# Patient Record
Sex: Female | Born: 1950 | Race: Black or African American | Hispanic: No | Marital: Single | Smoking: Never smoker
Health system: Southern US, Community
[De-identification: ages and names within clinical notes are randomized; demographics above are authoritative.]

## PROBLEM LIST (undated history)

## (undated) DIAGNOSIS — E119 Type 2 diabetes mellitus without complications: Secondary | ICD-10-CM

## (undated) DIAGNOSIS — R51 Headache: Secondary | ICD-10-CM

## (undated) DIAGNOSIS — R519 Headache, unspecified: Secondary | ICD-10-CM

## (undated) DIAGNOSIS — H409 Unspecified glaucoma: Secondary | ICD-10-CM

## (undated) DIAGNOSIS — M1990 Unspecified osteoarthritis, unspecified site: Secondary | ICD-10-CM

## (undated) DIAGNOSIS — T4145XA Adverse effect of unspecified anesthetic, initial encounter: Secondary | ICD-10-CM

## (undated) DIAGNOSIS — H269 Unspecified cataract: Secondary | ICD-10-CM

## (undated) DIAGNOSIS — E079 Disorder of thyroid, unspecified: Secondary | ICD-10-CM

## (undated) DIAGNOSIS — H209 Unspecified iridocyclitis: Secondary | ICD-10-CM

## (undated) DIAGNOSIS — T8859XA Other complications of anesthesia, initial encounter: Secondary | ICD-10-CM

## (undated) DIAGNOSIS — E039 Hypothyroidism, unspecified: Secondary | ICD-10-CM

## (undated) DIAGNOSIS — I10 Essential (primary) hypertension: Secondary | ICD-10-CM

## (undated) HISTORY — PX: CHOLECYSTECTOMY: SHX55

## (undated) HISTORY — PX: ABDOMINAL HYSTERECTOMY: SHX81

## (undated) HISTORY — PX: THYROID SURGERY: SHX805

## (undated) HISTORY — DX: Unspecified osteoarthritis, unspecified site: M19.90

## (undated) HISTORY — DX: Unspecified glaucoma: H40.9

## (undated) HISTORY — DX: Essential (primary) hypertension: I10

## (undated) HISTORY — PX: EYE SURGERY: SHX253

## (undated) HISTORY — DX: Unspecified cataract: H26.9

## (undated) HISTORY — DX: Unspecified iridocyclitis: H20.9

## (undated) HISTORY — PX: JOINT REPLACEMENT: SHX530

## (undated) HISTORY — DX: Disorder of thyroid, unspecified: E07.9

---

## 2011-08-11 DIAGNOSIS — M1711 Unilateral primary osteoarthritis, right knee: Secondary | ICD-10-CM | POA: Insufficient documentation

## 2012-12-31 ENCOUNTER — Encounter: Payer: Self-pay | Admitting: Family Medicine

## 2012-12-31 ENCOUNTER — Ambulatory Visit (INDEPENDENT_AMBULATORY_CARE_PROVIDER_SITE_OTHER): Payer: BC Managed Care – PPO | Admitting: Family Medicine

## 2012-12-31 VITALS — BP 130/70 | HR 78 | Temp 97.6°F | Resp 20 | Ht 67.0 in | Wt 250.0 lb

## 2012-12-31 DIAGNOSIS — E669 Obesity, unspecified: Secondary | ICD-10-CM

## 2012-12-31 DIAGNOSIS — I10 Essential (primary) hypertension: Secondary | ICD-10-CM

## 2012-12-31 DIAGNOSIS — E063 Autoimmune thyroiditis: Secondary | ICD-10-CM

## 2012-12-31 DIAGNOSIS — R609 Edema, unspecified: Secondary | ICD-10-CM

## 2012-12-31 MED ORDER — FUROSEMIDE 80 MG PO TABS: 80.0000 mg | ORAL_TABLET | Freq: Every day | ORAL | Status: DC

## 2012-12-31 MED ORDER — VITAMIN D3 50 MCG (2000 UT) PO TABS: 1.0000 | ORAL_TABLET | Freq: Every day | ORAL | Status: DC

## 2012-12-31 NOTE — Patient Instructions (Addendum)
Release of records- Dr.Micheal Willette Pa  Release of records- Dr.Robert Castellucci- [phone]  Release of records- Dr. Deneise Lever- GYN Take the lasix once a day Stop the HCTZ  Continue all other medications Referral to endocrinology F/U 1st week of November

## 2013-01-03 ENCOUNTER — Encounter: Payer: Self-pay | Admitting: Family Medicine

## 2013-01-03 DIAGNOSIS — E669 Obesity, unspecified: Secondary | ICD-10-CM | POA: Insufficient documentation

## 2013-01-03 DIAGNOSIS — R609 Edema, unspecified: Secondary | ICD-10-CM | POA: Insufficient documentation

## 2013-01-03 DIAGNOSIS — I10 Essential (primary) hypertension: Secondary | ICD-10-CM | POA: Insufficient documentation

## 2013-01-03 DIAGNOSIS — E063 Autoimmune thyroiditis: Secondary | ICD-10-CM | POA: Insufficient documentation

## 2013-01-03 NOTE — Assessment & Plan Note (Signed)
Discussed need for routine exercise

## 2013-01-03 NOTE — Assessment & Plan Note (Signed)
On replacement obtain records, referral to new endocrinologist
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
## 2013-01-03 NOTE — Assessment & Plan Note (Signed)
Continue current meds Obtain records and recent labs

## 2013-01-03 NOTE — Assessment & Plan Note (Signed)
D/c HCTZ, advised to take Lasix daily

## 2013-01-13 ENCOUNTER — Other Ambulatory Visit (HOSPITAL_COMMUNITY): Payer: Self-pay | Admitting: "Endocrinology

## 2013-01-13 DIAGNOSIS — Z9009 Acquired absence of other part of head and neck: Secondary | ICD-10-CM

## 2013-01-13 DIAGNOSIS — E890 Postprocedural hypothyroidism: Secondary | ICD-10-CM

## 2013-01-18 ENCOUNTER — Ambulatory Visit (HOSPITAL_COMMUNITY)
Admission: RE | Admit: 2013-01-18 | Discharge: 2013-01-18 | Disposition: A | Discharge status: Home / Self Care | Payer: BC Managed Care – PPO | Source: Ambulatory Visit | Attending: "Endocrinology | Admitting: "Endocrinology

## 2013-01-18 DIAGNOSIS — Z9009 Acquired absence of other part of head and neck: Secondary | ICD-10-CM

## 2013-01-18 DIAGNOSIS — Z09 Encounter for follow-up examination after completed treatment for conditions other than malignant neoplasm: Secondary | ICD-10-CM | POA: Insufficient documentation

## 2013-01-18 DIAGNOSIS — E890 Postprocedural hypothyroidism: Secondary | ICD-10-CM

## 2013-01-18 DIAGNOSIS — E0789 Other specified disorders of thyroid: Secondary | ICD-10-CM | POA: Insufficient documentation

## 2013-03-07 ENCOUNTER — Encounter: Payer: Self-pay | Admitting: Family Medicine

## 2013-03-07 ENCOUNTER — Ambulatory Visit (INDEPENDENT_AMBULATORY_CARE_PROVIDER_SITE_OTHER): Payer: BC Managed Care – PPO | Admitting: Family Medicine

## 2013-03-07 VITALS — BP 130/70 | HR 82 | Temp 98.3°F | Resp 18 | Ht 67.0 in | Wt 240.0 lb

## 2013-03-07 DIAGNOSIS — Z1211 Encounter for screening for malignant neoplasm of colon: Secondary | ICD-10-CM

## 2013-03-07 DIAGNOSIS — R7309 Other abnormal glucose: Secondary | ICD-10-CM

## 2013-03-07 DIAGNOSIS — R609 Edema, unspecified: Secondary | ICD-10-CM

## 2013-03-07 DIAGNOSIS — R7303 Prediabetes: Secondary | ICD-10-CM | POA: Insufficient documentation

## 2013-03-07 DIAGNOSIS — R7302 Impaired glucose tolerance (oral): Secondary | ICD-10-CM

## 2013-03-07 DIAGNOSIS — E669 Obesity, unspecified: Secondary | ICD-10-CM

## 2013-03-07 DIAGNOSIS — I10 Essential (primary) hypertension: Secondary | ICD-10-CM

## 2013-03-07 NOTE — Patient Instructions (Signed)
Release of records [facility], [place] - need stress test results/ Knee surgery report COntinue current medications Referral for colonoscopy F/U 6 Months

## 2013-03-07 NOTE — Assessment & Plan Note (Signed)
10 lb weight loss noted.  

## 2013-03-07 NOTE — Assessment & Plan Note (Signed)
Well controlled 

## 2013-03-07 NOTE — Progress Notes (Signed)
  Subjective:    Patient ID: Danielle Howard, female    DOB: July 18, 1950, 62 y.o.   MRN: [id number]  HPI  Patient here to follow chronic medical problems. She has no specific concerns today. She is due to have her colonoscopy scheduled. She did establish with endocrinology for her Hashimoto's thyroiditis labs and records were reviewed. She is taking the Lasix every day and notices a difference when she does take a daily. She is are he had her flu shot  Review of Systems  GEN- denies fatigue, fever, weight loss,weakness, recent illness HEENT- denies eye drainage, change in vision, nasal discharge, CVS- denies chest pain, palpitations RESP- denies SOB, cough, wheeze ABD- denies N/V, change in stools, abd pain GU- denies dysuria, hematuria, dribbling, incontinence MSK- denies joint pain, muscle aches, injury Neuro- denies headache, dizziness, syncope, seizure activity      Objective:   Physical Exam GEN- NAD, alert and oriented x3 HEENT- PERRL, EOMI, non injected sclera, pink conjunctiva, MMM, oropharynx clear Neck- Supple, no thryomegaly CVS- RRR, no murmur RESP-CTAB EXT- + pedal edema Pulses- Radial, DP- 2+        Assessment & Plan:

## 2013-03-07 NOTE — Assessment & Plan Note (Signed)
Improved She declines additional K with lasix , she does get cramping

## 2013-03-07 NOTE — Assessment & Plan Note (Addendum)
For now work on diet and exercise A1C  5.8% FLP normal

## 2013-03-09 ENCOUNTER — Encounter (INDEPENDENT_AMBULATORY_CARE_PROVIDER_SITE_OTHER): Payer: Self-pay | Admitting: *Deleted

## 2013-03-17 ENCOUNTER — Other Ambulatory Visit (INDEPENDENT_AMBULATORY_CARE_PROVIDER_SITE_OTHER): Payer: Self-pay | Admitting: *Deleted

## 2013-03-17 ENCOUNTER — Encounter (INDEPENDENT_AMBULATORY_CARE_PROVIDER_SITE_OTHER): Payer: Self-pay | Admitting: *Deleted

## 2013-03-17 ENCOUNTER — Telehealth (INDEPENDENT_AMBULATORY_CARE_PROVIDER_SITE_OTHER): Payer: Self-pay | Admitting: *Deleted

## 2013-03-17 DIAGNOSIS — Z1211 Encounter for screening for malignant neoplasm of colon: Secondary | ICD-10-CM

## 2013-03-17 MED ORDER — PEG-KCL-NACL-NASULF-NA ASC-C 100 G PO SOLR: 1.0000 | Freq: Once | ORAL | Status: DC

## 2013-03-17 NOTE — Telephone Encounter (Signed)
Patient needs movi prep 

## 2013-03-22 ENCOUNTER — Ambulatory Visit: Payer: Self-pay | Admitting: Family Medicine

## 2013-04-07 ENCOUNTER — Encounter (INDEPENDENT_AMBULATORY_CARE_PROVIDER_SITE_OTHER): Payer: Self-pay | Admitting: *Deleted

## 2013-04-25 ENCOUNTER — Telehealth (INDEPENDENT_AMBULATORY_CARE_PROVIDER_SITE_OTHER): Payer: Self-pay | Admitting: *Deleted

## 2013-04-25 NOTE — Telephone Encounter (Signed)
  Procedure: tcs  Reason/Indication:  screening  Has patient had this procedure before?  Yes, more than 10 yrs ago  If so, when, by whom and where?    Is there a family history of colon cancer?  no  Who?  What age when diagnosed?    Is patient diabetic?   no      Does patient have prosthetic heart valve?  no  Do you have a pacemaker?  no  Has patient ever had endocarditis? no  Has patient had joint replacement within last 12 months?  no  Does patient tend to be constipated or take laxatives? no  Is patient on Coumadin, Plavix and/or Aspirin? no  Medications: verapamil 240 mg bid, levothyroxine 150 mcg daily, astradiol 1 mg daily, furosemide 80 mg daily, vit d 1000 mg daily, calcium 600 mg daily, vita fusion multi vit bid  Allergies:   Medication Adjustment:   Procedure date & time: 05/25/13 at 950

## 2013-04-27 NOTE — Telephone Encounter (Signed)
agree

## 2013-05-10 ENCOUNTER — Encounter (HOSPITAL_COMMUNITY): Payer: Self-pay | Admitting: Pharmacy Technician

## 2013-05-20 ENCOUNTER — Telehealth: Payer: Self-pay | Admitting: Family Medicine

## 2013-05-20 NOTE — Telephone Encounter (Signed)
Pt went to pharmacy and verapamil is on back order, pharmacy says it could be Monday before they can get some in and the pt only has 10 pills  [facility]  Call back number is [phone]

## 2013-05-25 ENCOUNTER — Encounter (HOSPITAL_COMMUNITY): Payer: Self-pay | Admitting: *Deleted

## 2013-05-25 ENCOUNTER — Encounter (HOSPITAL_COMMUNITY)
Admission: RE | Disposition: A | Discharge status: Home / Self Care | Payer: Self-pay | Source: Ambulatory Visit | Attending: Internal Medicine

## 2013-05-25 ENCOUNTER — Ambulatory Visit (HOSPITAL_COMMUNITY)
Admission: RE | Admit: 2013-05-25 | Discharge: 2013-05-25 | Disposition: A | Discharge status: Home / Self Care | Payer: BC Managed Care – PPO | Source: Ambulatory Visit | Attending: Internal Medicine | Admitting: Internal Medicine

## 2013-05-25 DIAGNOSIS — K644 Residual hemorrhoidal skin tags: Secondary | ICD-10-CM | POA: Insufficient documentation

## 2013-05-25 DIAGNOSIS — I10 Essential (primary) hypertension: Secondary | ICD-10-CM | POA: Insufficient documentation

## 2013-05-25 DIAGNOSIS — Z79890 Hormone replacement therapy (postmenopausal): Secondary | ICD-10-CM | POA: Insufficient documentation

## 2013-05-25 DIAGNOSIS — D126 Benign neoplasm of colon, unspecified: Secondary | ICD-10-CM

## 2013-05-25 DIAGNOSIS — Z1211 Encounter for screening for malignant neoplasm of colon: Secondary | ICD-10-CM

## 2013-05-25 DIAGNOSIS — Z79899 Other long term (current) drug therapy: Secondary | ICD-10-CM | POA: Insufficient documentation

## 2013-05-25 HISTORY — PX: COLONOSCOPY: SHX5424

## 2013-05-25 HISTORY — DX: Hypothyroidism, unspecified: E03.9

## 2013-05-25 SURGERY — COLONOSCOPY
Anesthesia: Moderate Sedation

## 2013-05-25 MED ORDER — SIMETHICONE 40 MG/0.6ML PO SUSP
ORAL | Status: DC | PRN
  Administered 2013-05-25: 11:00:00

## 2013-05-25 MED ORDER — SODIUM CHLORIDE 0.9 % IV SOLN
INTRAVENOUS | Status: DC
  Administered 2013-05-25: 10:00:00 via INTRAVENOUS

## 2013-05-25 MED ORDER — MIDAZOLAM HCL 5 MG/5ML IJ SOLN
INTRAMUSCULAR | Status: DC | PRN
  Administered 2013-05-25: 2 mg via INTRAVENOUS
  Administered 2013-05-25: 3 mg via INTRAVENOUS
  Administered 2013-05-25 (×2): 2 mg via INTRAVENOUS

## 2013-05-25 MED ORDER — MEPERIDINE HCL 50 MG/ML IJ SOLN
INTRAMUSCULAR | Status: AC
  Filled 2013-05-25: qty 1

## 2013-05-25 MED ORDER — MEPERIDINE HCL 50 MG/ML IJ SOLN
INTRAMUSCULAR | Status: DC | PRN
  Administered 2013-05-25 (×2): 25 mg via INTRAVENOUS

## 2013-05-25 MED ORDER — MIDAZOLAM HCL 5 MG/5ML IJ SOLN
INTRAMUSCULAR | Status: AC
  Filled 2013-05-25: qty 10

## 2013-05-25 NOTE — H&P (Signed)
Danielle Howard is an 63 y.o. female.   Chief Complaint: Patient's here for colonoscopy. HPI: Patient is 63 year old African female who is screening colonoscopy. Her last exam was over 10 years ago when she was living in [place] which. She denies abdominal pain change in her bowel habits or rectal bleeding. Family history is negative for CRC.  Past Medical History  Diagnosis Date  . Hypertension   . Thyroid disease   . Hypothyroidism     Past Surgical History  Procedure Laterality Date  . Cholecystectomy      02/2012  . Abdominal hysterectomy    . Thyroid surgery    . Joint replacement Bilateral     2012,2013-bilateral knees    Family History  Problem Relation Age of Onset  . Arthritis Mother   . Heart disease Mother   . Hypertension Mother   . Arthritis Father   . Hyperlipidemia Father   . HIV/AIDS Brother    Social History:  reports that she has never smoked. She does not have any smokeless tobacco history on file. She reports that she drinks alcohol. She reports that she does not use illicit drugs.  Allergies:  Allergies  Allergen Reactions  . Dicloxacillin     GI upset    Medications Prior to Admission  Medication Sig Dispense Refill  . acetaminophen (TYLENOL) 500 MG tablet Take 500 mg by mouth 2 (two) times daily as needed for moderate pain or headache.      . calcium carbonate (OS-CAL) 600 MG TABS tablet Take 600 mg by mouth 2 (two) times daily with a meal.      . cholecalciferol (VITAMIN D) 1000 UNITS tablet Take 1,000 Units by mouth daily.      Marland Kitchen estradiol (ESTRACE) 1 MG tablet Take 1 mg by mouth daily.      . furosemide (LASIX) 80 MG tablet Take 1 tablet (80 mg total) by mouth daily.  90 tablet  1  . levothyroxine (SYNTHROID, LEVOTHROID) 150 MCG tablet Take 150 mcg by mouth daily before breakfast.      . Multiple Vitamins-Minerals (MULTIVITAMIN WITH MINERALS) tablet Take 1 tablet by mouth daily.      . peg 3350 powder (MOVIPREP) 100 G SOLR Take 1 kit (200 g  total) by mouth once.  1 kit  0  . verapamil (CALAN-SR) 240 MG CR tablet Take 240 mg by mouth 2 (two) times daily.         No results found for this or any previous visit (from the past 48 hour(s)). No results found.  ROS  Blood pressure 121/61, pulse 82, temperature 97.7 F (36.5 C), temperature source Oral, resp. rate 20, height _0  (1.702 m), weight 240 lb (108.863 kg), SpO2 97.00%. Physical Exam  Constitutional: She appears well-developed and well-nourished.  HENT:  Mouth/Throat: Oropharynx is clear and moist.  Eyes: Conjunctivae are normal. No scleral icterus.  Neck: No thyromegaly present.  Cardiovascular: Normal rate, regular rhythm and normal heart sounds.   No murmur heard. Respiratory: Effort normal and breath sounds normal.  GI: Soft. She exhibits no distension. There is no tenderness.  Musculoskeletal: She exhibits no edema.  Lymphadenopathy:    She has no cervical adenopathy.  Neurological: She is alert.  Skin: Skin is warm and dry.     Assessment/Plan Average risk screening colonoscopy.  Gwyndolyn Guilford U 05/25/2013, 10:40 AM

## 2013-05-25 NOTE — Discharge Instructions (Signed)
Resume usual diet and medications. No driving for 24 hours. Physician will contact you with biopsy results   Colonoscopy Care After These instructions give you information on caring for yourself after your procedure. Your doctor may also give you more specific instructions. Call your doctor if you have any problems or questions after your procedure. HOME CARE  Take it easy for the next 24 hours.  Rest.  Walk or use warm packs on your belly (abdomen) if you have belly cramping or gas.  Do not drive for 24 hours.  You may shower.  Do not sign important papers or use machinery for 24 hours.  Drink enough fluids to keep your pee (urine) clear or pale yellow.  Resume your normal diet. Avoid heavy or fried foods.  Avoid alcohol.  Continue taking your normal medicines.  Only take medicine as told by your doctor. Do not take aspirin. If you had growths (polyps) removed:  Do not take aspirin.  Do not drink alcohol for 7 days or as told by your doctor.  Eat a soft diet for 24 hours. GET HELP RIGHT AWAY IF:  You have a fever.  You pass clumps of tissue (blood clots) or fill the toilet with blood.  You have belly pain that gets worse and medicine does not help.  Your belly is puffy (swollen).  You feel sick to your stomach (nauseous) or throw up (vomit). MAKE SURE YOU:  Understand these instructions.  Will watch your condition.  Will get help right away if you are not doing well or get worse. Document Released: 05/24/2010 Document Revised: 07/14/2011 Document Reviewed: 12/27/2012 [facility] Patient Information 2014 [employer].

## 2013-05-25 NOTE — Op Note (Signed)
COLONOSCOPY PROCEDURE REPORT  PATIENT:  Danielle Howard  MR#:  [id number] Birthdate:  30-Jul-1950, 63 y.o., female Endoscopist:  Dr. Rogene Houston, MD Referred By:  Dr. Vic Blackbird, MD Procedure Date: 05/25/2013  Procedure:   Colonoscopy  Indications:  Patient is a 63 year old African female was undergoing average risk screening colonoscopy. Patient's last colonoscopy was over 10 years ago in [place].  Informed Consent:  The procedure and risks were reviewed with the patient and informed consent was obtained.  Medications:  Demerol 50 mg IV Versed 9 mg IV  Description of procedure:  After a digital rectal exam was performed, that colonoscope was advanced from the anus through the rectum and colon to the area of the cecum, ileocecal valve and appendiceal orifice. The cecum was deeply intubated. These structures were well-seen and photographed for the record. From the level of the cecum and ileocecal valve, the scope was slowly and cautiously withdrawn. The mucosal surfaces were carefully surveyed utilizing scope tip to flexion to facilitate fold flattening as needed. The scope was pulled down into the rectum where a thorough exam including retroflexion was performed.  Findings:   Prep satisfactory. Four small polyps ablated via cold biopsy and submitted together. These are located at cecum, ascending colon, splenic flexure and sigmoid colon. Normal rectal mucosa. Small hemorrhoids below the dentate line.   Therapeutic/Diagnostic Maneuvers Performed:  See above  Complications:  None  Cecal Withdrawal Time:  19 minutes  Impression:  Examination performed to cecum. Four small polyps ablated via cold biopsy and submitted together(cecum, ascending colon, splenic flexure and sigmoid colon). Small external hemorrhoids.  Recommendations:  Standard instructions given. I will contact patient with biopsy results and further recommendations.  REHMAN,NAJEEB U  05/25/2013 11:20  AM  CC: Dr. Vic Blackbird, MD & Dr. Rayne Du ref. provider found

## 2013-05-30 ENCOUNTER — Encounter (HOSPITAL_COMMUNITY): Payer: Self-pay | Admitting: Internal Medicine

## 2013-06-15 ENCOUNTER — Other Ambulatory Visit: Payer: Self-pay | Admitting: Family Medicine

## 2013-06-15 DIAGNOSIS — Z139 Encounter for screening, unspecified: Secondary | ICD-10-CM

## 2013-06-30 ENCOUNTER — Ambulatory Visit (HOSPITAL_COMMUNITY): Payer: BC Managed Care – PPO

## 2013-07-07 ENCOUNTER — Ambulatory Visit (HOSPITAL_COMMUNITY)
Admission: RE | Admit: 2013-07-07 | Discharge: 2013-07-07 | Disposition: A | Discharge status: Home / Self Care | Payer: BC Managed Care – PPO | Source: Ambulatory Visit | Attending: Family Medicine | Admitting: Family Medicine

## 2013-07-07 DIAGNOSIS — Z139 Encounter for screening, unspecified: Secondary | ICD-10-CM

## 2013-07-07 DIAGNOSIS — Z1231 Encounter for screening mammogram for malignant neoplasm of breast: Secondary | ICD-10-CM | POA: Insufficient documentation

## 2013-07-10 ENCOUNTER — Other Ambulatory Visit: Payer: Self-pay | Admitting: Family Medicine

## 2013-07-12 NOTE — Telephone Encounter (Signed)
Refill appropriate and filled per protocol. 

## 2013-09-05 ENCOUNTER — Ambulatory Visit (INDEPENDENT_AMBULATORY_CARE_PROVIDER_SITE_OTHER): Payer: BC Managed Care – PPO | Admitting: Family Medicine

## 2013-09-05 ENCOUNTER — Encounter: Payer: Self-pay | Admitting: Family Medicine

## 2013-09-05 VITALS — BP 142/78 | HR 80 | Temp 97.4°F | Resp 16 | Ht 65.0 in | Wt 237.0 lb

## 2013-09-05 DIAGNOSIS — R609 Edema, unspecified: Secondary | ICD-10-CM

## 2013-09-05 DIAGNOSIS — E669 Obesity, unspecified: Secondary | ICD-10-CM

## 2013-09-05 DIAGNOSIS — Z780 Asymptomatic menopausal state: Secondary | ICD-10-CM

## 2013-09-05 DIAGNOSIS — R21 Rash and other nonspecific skin eruption: Secondary | ICD-10-CM

## 2013-09-05 DIAGNOSIS — N951 Menopausal and female climacteric states: Secondary | ICD-10-CM

## 2013-09-05 DIAGNOSIS — I10 Essential (primary) hypertension: Secondary | ICD-10-CM

## 2013-09-05 MED ORDER — ESTRADIOL 0.5 MG PO TABS: 0.5000 mg | ORAL_TABLET | Freq: Every day | ORAL | Status: DC

## 2013-09-05 MED ORDER — TRIAMCINOLONE ACETONIDE 0.1 % EX CREA: 1.0000 "application " | TOPICAL_CREAM | Freq: Two times a day (BID) | CUTANEOUS | Status: DC

## 2013-09-05 MED ORDER — VERAPAMIL HCL ER 240 MG PO TBCR: 240.0000 mg | EXTENDED_RELEASE_TABLET | Freq: Two times a day (BID) | ORAL | Status: DC

## 2013-09-05 NOTE — Progress Notes (Signed)
Patient ID: Danielle Howard, female   DOB: [birth year], 63 y.o.   MRN: [id number]   Subjective:    Patient ID: Danielle Howard, female    DOB: [birth year], 63 y.o.   MRN: [id number]  Patient presents for 6 month F/U and rash to R lower leg  Patient here follow chronic medical problems. She's been seen by endocrinology and recently had some blood work done including her cholesterol panel. She's concerned about some small itchy bumps that she noticed on her legs yesterday. She denies any contact with any at insects. She denies any change her detergent or soap. She has been scratching a lot. She's not tried anything over-the-counter. She's not been taking her Lasix on a regular basis states that she does not like to take it and go out in public. She also has compression hose but does not like to wear these either.   Review Of Systems:  GEN- denies fatigue, fever, weight loss,weakness, recent illness HEENT- denies eye drainage, change in vision, nasal discharge, CVS- denies chest pain, palpitations RESP- denies SOB, cough, wheeze ABD- denies N/V, change in stools, abd pain GU- denies dysuria, hematuria, dribbling, incontinence MSK- denies joint pain, muscle aches, injury Neuro- denies headache, dizziness, syncope, seizure activity       Objective:    BP 142/78  Pulse 80  Temp(Src) 97.4 F (36.3 C) (Oral)  Resp 16  Ht 5\' 5"  (1.651 m)  Wt 237 lb (107.502 kg)  BMI 39.44 kg/m2 GEN- NAD, alert and oriented x3 HEENT- PERRL, EOMI, non injected sclera, pink conjunctiva, MMM, oropharynx clear CVS- RRR, no murmur RESP-CTAB Skin- few erythematous maculopaplar lesions scattered on RIght shin, no cellulitic changes, no indration, no vesicles,no abscess seen, +excoriations EXT- + 1+ pitting edema Pulses- Radial, DP- 2+        Assessment & Plan:      Problem List Items Addressed This Visit   Rash and nonspecific skin eruption   Peripheral edema - Primary   Obesity, unspecified   Essential  hypertension, benign   Relevant Medications      verapamil (CALAN-SR) CR tablet      Note: This dictation was prepared with Dragon dictation along with smaller phrase technology. Any transcriptional errors that result from this process are unintentional.

## 2013-09-05 NOTE — Patient Instructions (Addendum)
Continue current medication Take your fluid pill once a day Cut the estradiol in half once a day until the next prescription Use cream for rash F/U 6 months- for [facility] PHYSICAL

## 2013-09-05 NOTE — Assessment & Plan Note (Signed)
Blood pressure is elevated some today I think she would benefit from taking her Lasix on a regular basis. For now we'll continue the verapamil.

## 2013-09-05 NOTE — Assessment & Plan Note (Signed)
She continues to lose weight intentionally.

## 2013-09-05 NOTE — Assessment & Plan Note (Signed)
I provided her with triamcinolone cream nonspecific rash.

## 2013-09-05 NOTE — Assessment & Plan Note (Addendum)
She's been on estrogen for greater than 10 years. She wants to come off of it and I agree,  will decrease her to 0.5 mg once a day after a few months or try to take her to 3 times a week and then see if we can stop.

## 2013-09-05 NOTE — Assessment & Plan Note (Signed)
She is noncompliant regarding her diuretic she's not having leg pain or any difficulties from the swelling but I did voice her the importance of a diuretic and how venous stasis can affect her. Hopefully she will start to take on a regular basis as she does react well to the Lasix.

## 2014-01-13 DIAGNOSIS — R7309 Other abnormal glucose: Secondary | ICD-10-CM | POA: Diagnosis not present

## 2014-01-13 DIAGNOSIS — Z23 Encounter for immunization: Secondary | ICD-10-CM | POA: Diagnosis not present

## 2014-01-13 DIAGNOSIS — E038 Other specified hypothyroidism: Secondary | ICD-10-CM | POA: Diagnosis not present

## 2014-01-20 DIAGNOSIS — I10 Essential (primary) hypertension: Secondary | ICD-10-CM | POA: Diagnosis not present

## 2014-01-20 DIAGNOSIS — R7309 Other abnormal glucose: Secondary | ICD-10-CM | POA: Diagnosis not present

## 2014-01-20 DIAGNOSIS — E038 Other specified hypothyroidism: Secondary | ICD-10-CM | POA: Diagnosis not present

## 2014-03-08 ENCOUNTER — Other Ambulatory Visit: Payer: Self-pay | Admitting: Family Medicine

## 2014-03-08 ENCOUNTER — Ambulatory Visit (INDEPENDENT_AMBULATORY_CARE_PROVIDER_SITE_OTHER): Payer: Medicare Other | Admitting: Family Medicine

## 2014-03-08 ENCOUNTER — Encounter: Payer: Self-pay | Admitting: Family Medicine

## 2014-03-08 VITALS — BP 136/82 | HR 78 | Temp 98.0°F | Resp 14 | Ht 66.0 in | Wt 231.0 lb

## 2014-03-08 DIAGNOSIS — R7302 Impaired glucose tolerance (oral): Secondary | ICD-10-CM | POA: Diagnosis not present

## 2014-03-08 DIAGNOSIS — E669 Obesity, unspecified: Secondary | ICD-10-CM | POA: Diagnosis not present

## 2014-03-08 DIAGNOSIS — E063 Autoimmune thyroiditis: Secondary | ICD-10-CM

## 2014-03-08 DIAGNOSIS — R609 Edema, unspecified: Secondary | ICD-10-CM

## 2014-03-08 DIAGNOSIS — I10 Essential (primary) hypertension: Secondary | ICD-10-CM | POA: Diagnosis not present

## 2014-03-08 LAB — COMPREHENSIVE METABOLIC PANEL
ALT: 9 U/L (ref 0–35)
AST: 20 U/L (ref 0–37)
Albumin: 3.9 g/dL (ref 3.5–5.2)
Alkaline Phosphatase: 84 U/L (ref 39–117)
BUN: 13 mg/dL (ref 6–23)
CO2: 26 mEq/L (ref 19–32)
Calcium: 9.2 mg/dL (ref 8.4–10.5)
Chloride: 105 mEq/L (ref 96–112)
Creat: 0.8 mg/dL (ref 0.50–1.10)
Glucose, Bld: 81 mg/dL (ref 70–99)
Potassium: 4 mEq/L (ref 3.5–5.3)
Sodium: 142 mEq/L (ref 135–145)
Total Bilirubin: 0.7 mg/dL (ref 0.2–1.2)
Total Protein: 7.2 g/dL (ref 6.0–8.3)

## 2014-03-08 LAB — CBC WITH DIFFERENTIAL/PLATELET
Basophils Absolute: 0 10*3/uL (ref 0.0–0.1)
Basophils Relative: 0 % (ref 0–1)
Eosinophils Absolute: 0.2 10*3/uL (ref 0.0–0.7)
Eosinophils Relative: 4 % (ref 0–5)
HCT: 38.3 % (ref 36.0–46.0)
Hemoglobin: 13.1 g/dL (ref 12.0–15.0)
Lymphocytes Relative: 34 % (ref 12–46)
Lymphs Abs: 2.1 10*3/uL (ref 0.7–4.0)
MCH: 29.8 pg (ref 26.0–34.0)
MCHC: 34.2 g/dL (ref 30.0–36.0)
MCV: 87.2 fL (ref 78.0–100.0)
Monocytes Absolute: 0.4 10*3/uL (ref 0.1–1.0)
Monocytes Relative: 7 % (ref 3–12)
Neutro Abs: 3.4 10*3/uL (ref 1.7–7.7)
Neutrophils Relative %: 55 % (ref 43–77)
Platelets: 377 10*3/uL (ref 150–400)
RBC: 4.39 MIL/uL (ref 3.87–5.11)
RDW: 14.4 % (ref 11.5–15.5)
WBC: 6.1 10*3/uL (ref 4.0–10.5)

## 2014-03-08 NOTE — Patient Instructions (Addendum)
Continue current medications  We will call with lab results Podiatry referral  F/U 6 Months- PHYSICAL

## 2014-03-08 NOTE — Telephone Encounter (Signed)
Refill appropriate and filled per protocol. 

## 2014-03-08 NOTE — Assessment & Plan Note (Signed)
Continue with Lasix I will check her renal function today

## 2014-03-08 NOTE — Progress Notes (Signed)
Patient ID: Danielle Howard, female   DOB: Sep 15, 1950, 63 y.o.   MRN: [id number]   Subjective:    Patient ID: Danielle Howard, female    DOB: Jun 11, 1950, 63 y.o.   MRN: [id number]  Patient presents for 6 month F/U   Patient to follow-up chronic medical problems. She has no specific concerns today. She was seen by endocrinology for her Hashimoto's thyroiditis there was no change in her dose. She did have an elevated A1c to 6% and she was started on metformin 500 mg once a day. She is steadily trying to lose weight is down another 6 pounds since her last visit. She did not take her Lasix today but states that she has been taking a more regular basis and it has improved her leg swelling.  Regarding her estrogen therapy she is ready to take breakfast completely she's currently on 0.5 mg once a day  Review Of Systems:  GEN- denies fatigue, fever, weight loss,weakness, recent illness HEENT- denies eye drainage, change in vision, nasal discharge, CVS- denies chest pain, palpitations RESP- denies SOB, cough, wheeze ABD- denies N/V, change in stools, abd pain GU- denies dysuria, hematuria, dribbling, incontinence MSK- denies joint pain, muscle aches, injury Neuro- denies headache, dizziness, syncope, seizure activity       Objective:    BP 136/82 mmHg  Pulse 78  Temp(Src) 98 F (36.7 C) (Oral)  Resp 14  Ht 5\' 6"  (1.676 m)  Wt 231 lb (104.781 kg)  BMI 37.30 kg/m2 GEN- NAD, alert and oriented x3 HEENT- PERRL, EOMI, non injected sclera, pink conjunctiva, MMM, oropharynx clear Neck- Supple, no thyromegaly CVS- RRR, no murmur RESP-CTAB EXT- 1+ pitting edema L >R   Pulses- Radial, DP- 2+        Assessment & Plan:      Problem List Items Addressed This Visit    Obesity   Relevant Medications      metFORMIN (GLUCOPHAGE) 500 MG tablet   Hashimoto's thyroiditis   Glucose intolerance (impaired glucose tolerance)   Essential hypertension, benign - Primary   Relevant Orders      CBC with  Differential      Comprehensive metabolic panel      Note: This dictation was prepared with Dragon dictation along with smaller phrase technology. Any transcriptional errors that result from this process are unintentional.

## 2014-03-08 NOTE — Assessment & Plan Note (Signed)
Blood pressure looks good today to change her medication

## 2014-03-08 NOTE — Assessment & Plan Note (Signed)
If she is able to lose a significant amount of weight or bleed that she'll be able come off of the metformin this will likely assist her with some weight loss

## 2014-04-01 ENCOUNTER — Other Ambulatory Visit: Payer: Self-pay | Admitting: Family Medicine

## 2014-04-04 ENCOUNTER — Other Ambulatory Visit: Payer: Self-pay | Admitting: *Deleted

## 2014-04-04 MED ORDER — ESTRADIOL 0.5 MG PO TABS: 0.5000 mg | ORAL_TABLET | Freq: Every day | ORAL | Status: DC

## 2014-04-04 NOTE — Telephone Encounter (Signed)
Medication refilled per protocol. 

## 2014-04-04 NOTE — Telephone Encounter (Signed)
Received fax requesting refill on Estrace.   Refill appropriate and filled per protocol.

## 2014-04-05 DIAGNOSIS — M2141 Flat foot [pes planus] (acquired), right foot: Secondary | ICD-10-CM | POA: Diagnosis not present

## 2014-04-05 DIAGNOSIS — R600 Localized edema: Secondary | ICD-10-CM | POA: Diagnosis not present

## 2014-04-05 DIAGNOSIS — M79671 Pain in right foot: Secondary | ICD-10-CM | POA: Diagnosis not present

## 2014-06-04 ENCOUNTER — Other Ambulatory Visit: Payer: Self-pay | Admitting: Family Medicine

## 2014-06-05 NOTE — Telephone Encounter (Signed)
Refill appropriate and filled per protocol. 

## 2014-06-06 ENCOUNTER — Other Ambulatory Visit: Payer: Self-pay | Admitting: Family Medicine

## 2014-06-06 NOTE — Telephone Encounter (Signed)
Refill appropriate and filled per protocol. 

## 2014-07-21 DIAGNOSIS — E6609 Other obesity due to excess calories: Secondary | ICD-10-CM | POA: Diagnosis not present

## 2014-07-21 DIAGNOSIS — I10 Essential (primary) hypertension: Secondary | ICD-10-CM | POA: Diagnosis not present

## 2014-07-21 DIAGNOSIS — R7302 Impaired glucose tolerance (oral): Secondary | ICD-10-CM | POA: Diagnosis not present

## 2014-07-21 DIAGNOSIS — E039 Hypothyroidism, unspecified: Secondary | ICD-10-CM | POA: Diagnosis not present

## 2014-09-01 ENCOUNTER — Other Ambulatory Visit: Payer: Self-pay | Admitting: Family Medicine

## 2014-09-01 NOTE — Telephone Encounter (Signed)
Refill appropriate and filled per protocol. 

## 2014-09-13 ENCOUNTER — Other Ambulatory Visit: Payer: Self-pay | Admitting: Family Medicine

## 2014-09-13 ENCOUNTER — Encounter: Payer: Self-pay | Admitting: Family Medicine

## 2014-09-13 ENCOUNTER — Ambulatory Visit (INDEPENDENT_AMBULATORY_CARE_PROVIDER_SITE_OTHER): Payer: Medicare Other | Admitting: Family Medicine

## 2014-09-13 VITALS — BP 130/74 | HR 76 | Temp 97.8°F | Resp 16 | Ht 66.0 in | Wt 233.0 lb

## 2014-09-13 DIAGNOSIS — R7302 Impaired glucose tolerance (oral): Secondary | ICD-10-CM | POA: Diagnosis not present

## 2014-09-13 DIAGNOSIS — Z1231 Encounter for screening mammogram for malignant neoplasm of breast: Secondary | ICD-10-CM

## 2014-09-13 DIAGNOSIS — R609 Edema, unspecified: Secondary | ICD-10-CM

## 2014-09-13 DIAGNOSIS — E559 Vitamin D deficiency, unspecified: Secondary | ICD-10-CM | POA: Diagnosis not present

## 2014-09-13 DIAGNOSIS — E669 Obesity, unspecified: Secondary | ICD-10-CM | POA: Diagnosis not present

## 2014-09-13 DIAGNOSIS — I10 Essential (primary) hypertension: Secondary | ICD-10-CM

## 2014-09-13 DIAGNOSIS — N951 Menopausal and female climacteric states: Secondary | ICD-10-CM

## 2014-09-13 NOTE — Assessment & Plan Note (Signed)
I will obtain labs from endocrinology to make sure that she has not had her A1c and lipids done I did give her a lab slip just in case. She will continue the metformin. She would benefit from significant weight reduction she agrees to go see a nutritionist.

## 2014-09-13 NOTE — Assessment & Plan Note (Signed)
She does not take Lasix every day she would benefit from doing so.

## 2014-09-13 NOTE — Assessment & Plan Note (Signed)
Well controlled, no change to meds 

## 2014-09-13 NOTE — Progress Notes (Signed)
Patient ID: Danielle Howard, female   DOB: 10-Aug-1950, 64 y.o.   MRN: [id number]   Subjective:    Patient ID: Danielle Howard, female    DOB: 27-Jul-1950, 64 y.o.   MRN: [id number]  Patient presents for 6 month F/U  patient here to follow-up medications. She is also fasting. She has not lost any weight since her last visit she gave multiple excuses stating that her family stresses her out and she likes to  drink soda when she wants to and there are many foods that she just does not like that are healthy therefore she does not eat them. Eats typically 1 time a day. She is taking her medicines as prescribed.  She was seen by endocrinology back in March and states that labs were done but she is not sure of her cholesterol and A1c were done I do not have results of these today  Review Of Systems:  GEN- denies fatigue, fever, weight loss,weakness, recent illness HEENT- denies eye drainage, change in vision, nasal discharge, CVS- denies chest pain, palpitations RESP- denies SOB, cough, wheeze ABD- denies N/V, change in stools, abd pain GU- denies dysuria, hematuria, dribbling, incontinence MSK- denies joint pain, muscle aches, injury Neuro- denies headache, dizziness, syncope, seizure activity       Objective:    BP 130/74 mmHg  Pulse 76  Temp(Src) 97.8 F (36.6 C) (Oral)  Resp 16  Ht 5\' 6"  (1.676 m)  Wt 233 lb (105.688 kg)  BMI 37.63 kg/m2 GEN- NAD, alert and oriented x3 HEENT- PERRL, EOMI, non injected sclera, pink conjunctiva, MMM, oropharynx clear Neck- Supple, no thyromegaly CVS- RRR, no murmur RESP-CTAB EXT- pedal edema L >R Pulses- Radial, DP- 2+        Assessment & Plan:      Problem List Items Addressed This Visit    Obesity   Relevant Orders   Lipid panel   Glucose intolerance (impaired glucose tolerance) - Primary   Relevant Orders   Hemoglobin A1c   Essential hypertension, benign   Relevant Orders   CBC with Differential/Platelet   Comprehensive metabolic panel   Lipid panel    Other Visit Diagnoses    Vitamin D deficiency        Relevant Orders    Vitamin D, 25-hydroxy       Note: This dictation was prepared with Dragon dictation along with smaller phrase technology. Any transcriptional errors that result from this process are unintentional.

## 2014-09-13 NOTE — Patient Instructions (Addendum)
We will call for you to go get labs done Continue current meds You have to work on diet to help your weight loss and eat at least in 3 meals  Schedule bone density and mammogram Referral to nutritionist  F/U 4 Months for Medicare [facility]

## 2014-09-25 DIAGNOSIS — Z96653 Presence of artificial knee joint, bilateral: Secondary | ICD-10-CM | POA: Diagnosis not present

## 2014-10-03 ENCOUNTER — Other Ambulatory Visit: Payer: Self-pay | Admitting: Family Medicine

## 2014-10-03 NOTE — Telephone Encounter (Signed)
Refill appropriate and filled per protocol. 

## 2014-10-04 ENCOUNTER — Ambulatory Visit (HOSPITAL_COMMUNITY)
Admission: RE | Admit: 2014-10-04 | Discharge: 2014-10-04 | Disposition: A | Discharge status: Home / Self Care | Payer: Medicare Other | Source: Ambulatory Visit | Attending: Family Medicine | Admitting: Family Medicine

## 2014-10-04 DIAGNOSIS — Z1231 Encounter for screening mammogram for malignant neoplasm of breast: Secondary | ICD-10-CM | POA: Diagnosis present

## 2014-10-04 DIAGNOSIS — Z780 Asymptomatic menopausal state: Secondary | ICD-10-CM | POA: Diagnosis not present

## 2014-10-04 DIAGNOSIS — N951 Menopausal and female climacteric states: Secondary | ICD-10-CM

## 2014-10-05 ENCOUNTER — Other Ambulatory Visit: Payer: Self-pay | Admitting: Family Medicine

## 2014-10-05 DIAGNOSIS — R928 Other abnormal and inconclusive findings on diagnostic imaging of breast: Secondary | ICD-10-CM

## 2014-10-09 ENCOUNTER — Other Ambulatory Visit: Payer: Self-pay | Admitting: Family Medicine

## 2014-10-09 DIAGNOSIS — R928 Other abnormal and inconclusive findings on diagnostic imaging of breast: Secondary | ICD-10-CM

## 2014-10-12 ENCOUNTER — Ambulatory Visit
Admission: RE | Admit: 2014-10-12 | Discharge: 2014-10-12 | Disposition: A | Discharge status: Home / Self Care | Payer: Medicare Other | Source: Ambulatory Visit | Attending: Family Medicine | Admitting: Family Medicine

## 2014-10-12 DIAGNOSIS — R928 Other abnormal and inconclusive findings on diagnostic imaging of breast: Secondary | ICD-10-CM

## 2014-10-12 DIAGNOSIS — N6002 Solitary cyst of left breast: Secondary | ICD-10-CM | POA: Diagnosis not present

## 2014-10-13 ENCOUNTER — Telehealth: Payer: Self-pay | Admitting: Family Medicine

## 2014-10-13 NOTE — Telephone Encounter (Signed)
Returned call to patient.   Reports that she has cough and nasal congestion x1 day.   Advised to use OTC Mucinex or Robitussin for cough and nasal saline for congestion. Advised to increase rest and to increase fluid intake. Recommended that if symptoms worsen or persist >1 week to contact office for visit.

## 2014-10-13 NOTE — Telephone Encounter (Signed)
Patient calling requesting prescription for her bad cold and congestion be called to pham [phone]

## 2014-11-27 ENCOUNTER — Ambulatory Visit (INDEPENDENT_AMBULATORY_CARE_PROVIDER_SITE_OTHER): Payer: Medicare Other | Admitting: Physician Assistant

## 2014-11-27 ENCOUNTER — Encounter: Payer: Self-pay | Admitting: Physician Assistant

## 2014-11-27 VITALS — BP 136/70 | HR 78 | Temp 98.1°F | Resp 18 | Wt 232.0 lb

## 2014-11-27 DIAGNOSIS — L200 Besnier's prurigo: Secondary | ICD-10-CM | POA: Diagnosis not present

## 2014-11-27 DIAGNOSIS — L239 Allergic contact dermatitis, unspecified cause: Secondary | ICD-10-CM

## 2014-11-27 DIAGNOSIS — Z87898 Personal history of other specified conditions: Secondary | ICD-10-CM | POA: Diagnosis not present

## 2014-11-27 MED ORDER — SCOPOLAMINE 1 MG/3DAYS TD PT72: MEDICATED_PATCH | TRANSDERMAL | Status: DC

## 2014-11-27 MED ORDER — PREDNISONE 20 MG PO TABS: ORAL_TABLET | ORAL | Status: DC

## 2014-11-27 NOTE — Progress Notes (Signed)
Patient ID: Lashon Beringer MRN: [id number], DOB: 04-21-1951, 64 y.o. Date of Encounter: 11/27/2014, 9:13 AM    Chief Complaint:  Chief Complaint  Patient presents with  . Rash    on both arms and chest and shoulders, constant itch X 1 week  . OTHER    would like to have a dramaine patch going on cruise     HPI: 64 y.o. year old AA female reports above.  She says that this itchy rash was a lot worse last week when she initially called but we had no appointments available until today. Some of the areas of rash are drying up and the itching has resolved but still has some areas of itchy rash on the left forearm and left anterior chest.  Also says that she is going on a cruise for 7 days and will need patch to use for motion sickness.     Home Meds:   Outpatient Prescriptions Prior to Visit  Medication Sig Dispense Refill  . acetaminophen (TYLENOL) 500 MG tablet Take 500 mg by mouth 2 (two) times daily as needed for moderate pain or headache.    . calcium carbonate (OS-CAL) 600 MG TABS tablet Take 600 mg by mouth 2 (two) times daily with a meal.    . cholecalciferol (VITAMIN D) 1000 UNITS tablet Take 1,000 Units by mouth daily.    . furosemide (LASIX) 80 MG tablet TAKE 1 TABLET BY MOUTH EVERY DAY 90 tablet 0  . levothyroxine (SYNTHROID, LEVOTHROID) 150 MCG tablet Take 150 mcg by mouth daily before breakfast.    . metFORMIN (GLUCOPHAGE) 500 MG tablet Take 500 mg by mouth daily with breakfast.    . Multiple Vitamins-Minerals (MULTIVITAMIN WITH MINERALS) tablet Take 1 tablet by mouth daily.    . verapamil (CALAN-SR) 240 MG CR tablet TAKE 1 TABLET BY MOUTH TWICE DAILY 180 tablet 0   No facility-administered medications prior to visit.    Allergies:  Allergies  Allergen Reactions  . Dicloxacillin     GI upset      Review of Systems: See HPI for pertinent ROS. All other ROS negative.    Physical Exam: Blood pressure 136/70, pulse 78, temperature 98.1 F (36.7 C), temperature  source Oral, resp. rate 18, weight 232 lb (105.235 kg)., Body mass index is 37.46 kg/(m^2). General:  WNWD AAF. Appears in no acute distress. Neck: Supple. No thyromegaly. No lymphadenopathy. Lungs: Clear bilaterally to auscultation without wheezes, rales, or rhonchi. Breathing is unlabored. Heart: Regular rhythm. No murmurs, rubs, or gallops. Msk:  Strength and tone normal for age. Skin: Right Forearm: Has area of resolving rash--linear distribution x 2 lines.  Left Forearm: Scattered pink splotchy papules. Same splotchy papules on left anterior chest.  Neuro: Alert and oriented X 3. Moves all extremities spontaneously. Gait is normal. CNII-XII grossly in tact. Psych:  Responds to questions appropriately with a normal affect.     ASSESSMENT AND PLAN:  64 y.o. year old female with  1. Allergic dermatitis Treat with prednisone taper. Follow-up if symptoms of itching and rash are not resolved completion of this. Noted she is on metformin. Reassured her that this medication may cause increased blood sugar while she is on the medicine. - predniSONE (DELTASONE) 20 MG tablet; Take 3 daily for 2 days, then 2 daily for 2 days, then 1 daily for 2 days.  Dispense: 12 tablet; Refill: 0  2. H/O motion sickness - scopolamine (TRANSDERM-SCOP, 1.5 MG,) 1 MG/3DAYS; Apply 1 disc behind ear 4  hours prior to event. Replace every 3 days.  Dispense: 4 patch; Refill: 1   Signed, [address] [place], [place], [facility] 11/27/2014 9:13 AM

## 2014-11-28 ENCOUNTER — Other Ambulatory Visit: Payer: Self-pay | Admitting: Family Medicine

## 2014-11-28 NOTE — Telephone Encounter (Signed)
Refill appropriate and filled per protocol. 

## 2014-12-30 ENCOUNTER — Other Ambulatory Visit: Payer: Self-pay | Admitting: Family Medicine

## 2015-01-01 NOTE — Telephone Encounter (Signed)
Refill appropriate and filled per protocol. 

## 2015-01-15 ENCOUNTER — Encounter: Payer: Self-pay | Admitting: Family Medicine

## 2015-01-15 ENCOUNTER — Ambulatory Visit (INDEPENDENT_AMBULATORY_CARE_PROVIDER_SITE_OTHER): Payer: Medicare Other | Admitting: Family Medicine

## 2015-01-15 VITALS — BP 134/72 | HR 68 | Temp 98.1°F | Resp 16 | Ht 66.0 in | Wt 232.0 lb

## 2015-01-15 DIAGNOSIS — E669 Obesity, unspecified: Secondary | ICD-10-CM | POA: Diagnosis not present

## 2015-01-15 DIAGNOSIS — Z23 Encounter for immunization: Secondary | ICD-10-CM

## 2015-01-15 DIAGNOSIS — R7302 Impaired glucose tolerance (oral): Secondary | ICD-10-CM

## 2015-01-15 DIAGNOSIS — I10 Essential (primary) hypertension: Secondary | ICD-10-CM | POA: Diagnosis not present

## 2015-01-15 DIAGNOSIS — Z0000 Encounter for general adult medical examination without abnormal findings: Secondary | ICD-10-CM

## 2015-01-15 MED ORDER — FUROSEMIDE 80 MG PO TABS: 80.0000 mg | ORAL_TABLET | Freq: Every day | ORAL | Status: DC

## 2015-01-15 MED ORDER — VERAPAMIL HCL ER 240 MG PO TBCR: 240.0000 mg | EXTENDED_RELEASE_TABLET | Freq: Two times a day (BID) | ORAL | Status: DC

## 2015-01-15 MED ORDER — METFORMIN HCL 500 MG PO TABS: 500.0000 mg | ORAL_TABLET | Freq: Every day | ORAL | Status: DC

## 2015-01-15 NOTE — Patient Instructions (Addendum)
Get the labs done fasting Flu shot and pneumonia vaccine given Continue current medications F/U 4 months

## 2015-01-15 NOTE — Assessment & Plan Note (Signed)
No change in her weight. Continue to reiterate importance of healthy eating low-carb low-fat she would benefit from weight reduction

## 2015-01-15 NOTE — Progress Notes (Signed)
Patient ID: Danielle Howard, female   DOB: 08/07/1950, 64 y.o.   MRN: [id number] Subjective:   Patient presents for Medicare Annual/Subsequent preventive examination.   patient here for annual wellness exam. She has no particular concerns today. She is taking her medicines as prescribed with the exception of the Lasix she uses a couple days a week. She has follow-up with her endocrinologist next week and is due for fasting labs this week.   Review Past Medical/Family/Social:per EMR   Risk Factors  Current exercise habits: none Dietary issues discussed: yes  Cardiac risk factors: Obesity (BMI >= 30 kg/m2). Glucose intolerance hypertension  Depression Screen  (Note: if answer to either of the following is "Yes", a more complete depression screening is indicated)  Over the past two weeks, have you felt down, depressed or hopeless? No Over the past two weeks, have you felt little interest or pleasure in doing things? No Have you lost interest or pleasure in daily life? No Do you often feel hopeless? No Do you cry easily over simple problems? No   Activities of Daily Living  In your present state of health, do you have any difficulty performing the following activities?:  Driving? No  Managing money? No  Feeding yourself? No  Getting from bed to chair? No  Climbing a flight of stairs? No  Preparing food and eating?: No  Bathing or showering? No  Getting dressed: No  Getting to the toilet? No  Using the toilet:No  Moving around from place to place: No  In the past year have you fallen or had a near fall?:No  Are you sexually active? No  Do you have more than one partner? No   Hearing Difficulties: No  Do you often ask people to speak up or repeat themselves? No  Do you experience ringing or noises in your ears? No Do you have difficulty understanding soft or whispered voices? No  Do you feel that you have a problem with memory? No Do you often misplace items? No  Do you feel safe at  home? Yes  Cognitive Testing  Alert? Yes Normal Appearance?Yes  Oriented to person? Yes Place? Yes  Time? Yes  Recall of three objects? Yes  Can perform simple calculations? Yes  Displays appropriate judgment?Yes  Can read the correct time from a watch face?Yes   List the Names of Other Physician/Practitioners you currently use: Dr. Dorris Fetch   Screening Tests / Date Colonoscopy   -up-to-date                  Zostavax -up-to-date Mammogram -up-to-date Influenza Vaccine -given today Tetanus/tdap-up-to-date    Assessment:    Annual wellness medicare exam   Plan:    During the course of the visit the patient was educated and counseled about appropriate screening and preventive services including:  Depression screen is negative  Prevnar 13 booster given, flu shot also given      Diet review for nutrition referral? Yes ____ Not Indicated __x__  Patient Instructions (the written plan) was given to the patient.  Medicare Attestation  I have personally reviewed:  The patient's medical and social history  Their use of alcohol, tobacco or illicit drugs  Their current medications and supplements  The patient's functional ability including ADLs,fall risks, home safety risks, cognitive, and hearing and visual impairment  Diet and physical activities  Evidence for depression or mood disorders  The patient's weight, height, BMI, and visual acuity have been recorded in the chart. I have  made referrals, counseling, and provided education to the patient based on review of the above and I have provided the patient with a written personalized care plan for preventive services.

## 2015-01-15 NOTE — Assessment & Plan Note (Signed)
Blood pressure well controlled continue to reiterate the use of Lasix. She will have fasting labs done tomorrow A follow-up with endocrinology for her thyroiditis

## 2015-01-16 DIAGNOSIS — R7302 Impaired glucose tolerance (oral): Secondary | ICD-10-CM | POA: Diagnosis not present

## 2015-01-16 DIAGNOSIS — E669 Obesity, unspecified: Secondary | ICD-10-CM | POA: Diagnosis not present

## 2015-01-16 DIAGNOSIS — E559 Vitamin D deficiency, unspecified: Secondary | ICD-10-CM | POA: Diagnosis not present

## 2015-01-16 DIAGNOSIS — I10 Essential (primary) hypertension: Secondary | ICD-10-CM | POA: Diagnosis not present

## 2015-01-17 LAB — CBC WITH DIFFERENTIAL/PLATELET
Basophils Absolute: 0 10*3/uL (ref 0.0–0.1)
Basophils Relative: 0 % (ref 0–1)
Eosinophils Absolute: 0.3 10*3/uL (ref 0.0–0.7)
Eosinophils Relative: 4 % (ref 0–5)
HCT: 35.4 % — ABNORMAL LOW (ref 36.0–46.0)
Hemoglobin: 11.1 g/dL — ABNORMAL LOW (ref 12.0–15.0)
Lymphocytes Relative: 27 % (ref 12–46)
Lymphs Abs: 1.9 10*3/uL (ref 0.7–4.0)
MCH: 27.7 pg (ref 26.0–34.0)
MCHC: 31.4 g/dL (ref 30.0–36.0)
MCV: 88.3 fL (ref 78.0–100.0)
MPV: 9.1 fL (ref 8.6–12.4)
Monocytes Absolute: 0.4 10*3/uL (ref 0.1–1.0)
Monocytes Relative: 6 % (ref 3–12)
Neutro Abs: 4.4 10*3/uL (ref 1.7–7.7)
Neutrophils Relative %: 63 % (ref 43–77)
Platelets: 385 10*3/uL (ref 150–400)
RBC: 4.01 MIL/uL (ref 3.87–5.11)
RDW: 14.5 % (ref 11.5–15.5)
WBC: 7 10*3/uL (ref 4.0–10.5)

## 2015-01-17 LAB — HEMOGLOBIN A1C
Hgb A1c MFr Bld: 5.9 % — ABNORMAL HIGH (ref <5.7)
Mean Plasma Glucose: 123 mg/dL — ABNORMAL HIGH (ref <117)

## 2015-01-17 LAB — LIPID PANEL
Cholesterol: 164 mg/dL (ref 125–200)
HDL: 53 mg/dL (ref >=46)
LDL Cholesterol: 92 mg/dL (ref <130)
Total CHOL/HDL Ratio: 3.1 Ratio (ref <=5.0)
Triglycerides: 93 mg/dL (ref <150)
VLDL: 19 mg/dL (ref <30)

## 2015-01-17 LAB — VITAMIN D 25 HYDROXY (VIT D DEFICIENCY, FRACTURES): Vit D, 25-Hydroxy: 45 ng/mL (ref 30–100)

## 2015-01-23 DIAGNOSIS — E6609 Other obesity due to excess calories: Secondary | ICD-10-CM | POA: Diagnosis not present

## 2015-01-23 DIAGNOSIS — E039 Hypothyroidism, unspecified: Secondary | ICD-10-CM | POA: Diagnosis not present

## 2015-01-23 DIAGNOSIS — I10 Essential (primary) hypertension: Secondary | ICD-10-CM | POA: Diagnosis not present

## 2015-01-23 DIAGNOSIS — R7309 Other abnormal glucose: Secondary | ICD-10-CM | POA: Diagnosis not present

## 2015-04-04 ENCOUNTER — Other Ambulatory Visit: Payer: Self-pay | Admitting: "Endocrinology

## 2015-04-06 ENCOUNTER — Other Ambulatory Visit: Payer: Self-pay

## 2015-04-06 MED ORDER — LEVOTHYROXINE SODIUM 150 MCG PO TABS: 150.0000 ug | ORAL_TABLET | Freq: Every day | ORAL | Status: DC

## 2015-04-09 ENCOUNTER — Other Ambulatory Visit: Payer: Self-pay

## 2015-05-21 ENCOUNTER — Ambulatory Visit: Payer: Medicare Other | Admitting: Family Medicine

## 2015-05-30 ENCOUNTER — Encounter: Payer: Self-pay | Admitting: Family Medicine

## 2015-05-30 ENCOUNTER — Ambulatory Visit: Payer: Medicare Other | Admitting: Family Medicine

## 2015-05-30 ENCOUNTER — Ambulatory Visit (INDEPENDENT_AMBULATORY_CARE_PROVIDER_SITE_OTHER): Payer: Medicare Other | Admitting: Family Medicine

## 2015-05-30 VITALS — BP 138/72 | HR 70 | Temp 97.8°F | Resp 16 | Ht 66.0 in | Wt 230.0 lb

## 2015-05-30 DIAGNOSIS — Z1159 Encounter for screening for other viral diseases: Secondary | ICD-10-CM | POA: Diagnosis not present

## 2015-05-30 DIAGNOSIS — R7302 Impaired glucose tolerance (oral): Secondary | ICD-10-CM

## 2015-05-30 DIAGNOSIS — E669 Obesity, unspecified: Secondary | ICD-10-CM | POA: Diagnosis not present

## 2015-05-30 DIAGNOSIS — I10 Essential (primary) hypertension: Secondary | ICD-10-CM

## 2015-05-30 LAB — CBC WITH DIFFERENTIAL/PLATELET
Basophils Absolute: 0 10*3/uL (ref 0.0–0.1)
Basophils Relative: 0 % (ref 0–1)
Eosinophils Absolute: 0.2 10*3/uL (ref 0.0–0.7)
Eosinophils Relative: 3 % (ref 0–5)
HCT: 39.6 % (ref 36.0–46.0)
Hemoglobin: 12.8 g/dL (ref 12.0–15.0)
Lymphocytes Relative: 37 % (ref 12–46)
Lymphs Abs: 2.4 10*3/uL (ref 0.7–4.0)
MCH: 28.2 pg (ref 26.0–34.0)
MCHC: 32.3 g/dL (ref 30.0–36.0)
MCV: 87.2 fL (ref 78.0–100.0)
MPV: 9.3 fL (ref 8.6–12.4)
Monocytes Absolute: 0.4 10*3/uL (ref 0.1–1.0)
Monocytes Relative: 6 % (ref 3–12)
Neutro Abs: 3.6 10*3/uL (ref 1.7–7.7)
Neutrophils Relative %: 54 % (ref 43–77)
Platelets: 450 10*3/uL — ABNORMAL HIGH (ref 150–400)
RBC: 4.54 MIL/uL (ref 3.87–5.11)
RDW: 14.4 % (ref 11.5–15.5)
WBC: 6.6 10*3/uL (ref 4.0–10.5)

## 2015-05-30 LAB — COMPREHENSIVE METABOLIC PANEL
ALT: 12 U/L (ref 6–29)
AST: 21 U/L (ref 10–35)
Albumin: 4 g/dL (ref 3.6–5.1)
Alkaline Phosphatase: 93 U/L (ref 33–130)
BUN: 12 mg/dL (ref 7–25)
CO2: 31 mmol/L (ref 20–31)
Calcium: 9.4 mg/dL (ref 8.6–10.4)
Chloride: 99 mmol/L (ref 98–110)
Creat: 0.87 mg/dL (ref 0.50–0.99)
Glucose, Bld: 74 mg/dL (ref 70–99)
Potassium: 3.5 mmol/L (ref 3.5–5.3)
Sodium: 140 mmol/L (ref 135–146)
Total Bilirubin: 0.9 mg/dL (ref 0.2–1.2)
Total Protein: 7.5 g/dL (ref 6.1–8.1)

## 2015-05-30 NOTE — Progress Notes (Signed)
Patient ID: Danielle Howard, female   DOB: 13-Jan-1951, 65 y.o.   MRN: [id number]   Subjective:    Patient ID: Danielle Howard, female    DOB: Nov 17, 1950, 65 y.o.   MRN: [id number]  Patient presents for 4 mont F/U and Health Maintence Update  Here for follow-up. She has no particular concerns today. She is still taking care of both of her parents. She is taking her medicines for the most part as prescribed. Occasionally she may miss her water pill. She is using compression hose.  She still following endocrinology for her thyroiditis. She also has glucose intolerance and she is currently on metformin once a day her last A1c was 5.9%.  She would like hepatitis C screening Review Of Systems:  GEN- denies fatigue, fever, weight loss,weakness, recent illness HEENT- denies eye drainage, change in vision, nasal discharge, CVS- denies chest pain, palpitations RESP- denies SOB, cough, wheeze ABD- denies N/V, change in stools, abd pain GU- denies dysuria, hematuria, dribbling, incontinence MSK- denies joint pain, muscle aches, injury Neuro- denies headache, dizziness, syncope, seizure activity       Objective:    BP 138/72 mmHg  Pulse 70  Temp(Src) 97.8 F (36.6 C) (Oral)  Resp 16  Ht 5\' 6"  (1.676 m)  Wt 230 lb (104.327 kg)  BMI 37.14 kg/m2 GEN- NAD, alert and oriented x3 HEENT- PERRL, EOMI, non injected sclera, pink conjunctiva, MMM, oropharynx clear CVS- RRR, no murmur RESP-CTAB EXT- trace pedal  edema Pulses- Radial, DP- 2+        Assessment & Plan:      Problem List Items Addressed This Visit    Obesity   Glucose intolerance (impaired glucose tolerance)   Essential hypertension, benign - Primary   Relevant Orders   CBC with Differential/Platelet   Comprehensive metabolic panel    Other Visit Diagnoses    Need for hepatitis C screening test        Relevant Orders    Hepatitis C Antibody       Note: This dictation was prepared with Dragon dictation along with smaller  phrase technology. Any transcriptional errors that result from this process are unintentional.

## 2015-05-30 NOTE — Patient Instructions (Signed)
Continue current medications We will call with lab results  F/U 4 months  

## 2015-05-31 ENCOUNTER — Encounter: Payer: Self-pay | Admitting: *Deleted

## 2015-05-31 ENCOUNTER — Encounter: Payer: Self-pay | Admitting: Family Medicine

## 2015-05-31 LAB — HEPATITIS C ANTIBODY: HCV Ab: NEGATIVE

## 2015-05-31 NOTE — Assessment & Plan Note (Signed)
Time spent discussing dietary changes, increase aerobic exercise

## 2015-05-31 NOTE — Assessment & Plan Note (Signed)
Blood pressure looks good, no change to meds 

## 2015-05-31 NOTE — Assessment & Plan Note (Signed)
On MTF however endocrinology will be checking A1c with her TFT

## 2015-07-02 ENCOUNTER — Other Ambulatory Visit: Payer: Self-pay | Admitting: "Endocrinology

## 2015-07-02 ENCOUNTER — Other Ambulatory Visit: Payer: Self-pay

## 2015-07-02 MED ORDER — LEVOTHYROXINE SODIUM 137 MCG PO TABS: 137.0000 ug | ORAL_TABLET | Freq: Every day | ORAL | Status: DC

## 2015-07-03 LAB — HM DIABETES EYE EXAM

## 2015-07-16 LAB — HEMOGLOBIN A1C: Hemoglobin A1C: 6.1

## 2015-07-23 ENCOUNTER — Telehealth: Payer: Self-pay | Admitting: "Endocrinology

## 2015-07-23 ENCOUNTER — Ambulatory Visit (INDEPENDENT_AMBULATORY_CARE_PROVIDER_SITE_OTHER): Payer: Medicare Other | Admitting: "Endocrinology

## 2015-07-23 ENCOUNTER — Encounter: Payer: Self-pay | Admitting: "Endocrinology

## 2015-07-23 VITALS — BP 145/81 | HR 70 | Ht 66.0 in | Wt 229.0 lb

## 2015-07-23 DIAGNOSIS — R7303 Prediabetes: Secondary | ICD-10-CM | POA: Diagnosis not present

## 2015-07-23 DIAGNOSIS — E669 Obesity, unspecified: Secondary | ICD-10-CM | POA: Diagnosis not present

## 2015-07-23 DIAGNOSIS — I10 Essential (primary) hypertension: Secondary | ICD-10-CM

## 2015-07-23 DIAGNOSIS — E039 Hypothyroidism, unspecified: Secondary | ICD-10-CM

## 2015-07-23 MED ORDER — METFORMIN HCL 500 MG PO TABS: 500.0000 mg | ORAL_TABLET | Freq: Every day | ORAL | Status: DC

## 2015-07-23 MED ORDER — LEVOTHYROXINE SODIUM 137 MCG PO TABS: 137.0000 ug | ORAL_TABLET | Freq: Every day | ORAL | Status: DC

## 2015-07-23 NOTE — Progress Notes (Signed)
Subjective:    Patient ID: Danielle Howard, female    DOB: [birth year], PCP Vic Blackbird, MD   Past Medical History  Diagnosis Date  . Hypertension   . Thyroid disease   . Hypothyroidism    Past Surgical History  Procedure Laterality Date  . Cholecystectomy      02/2012  . Abdominal hysterectomy    . Thyroid surgery    . Joint replacement Bilateral     2012,2013-bilateral knees  . Colonoscopy N/A 05/25/2013    Procedure: COLONOSCOPY;  Surgeon: Rogene Houston, MD;  Location: [facility];  Service: Endoscopy;  Laterality: N/A;  830-moved to 1055 Ann to notify pt   Social History   Social History  . Marital Status: Single    Spouse Name: N/A  . Number of Children: N/A  . Years of Education: N/A   Social History Main Topics  . Smoking status: Never Smoker   . Smokeless tobacco: Never Used  . Alcohol Use: Yes     Comment: occasional  . Drug Use: No  . Sexual Activity: Yes   Other Topics Concern  . None   Social History Narrative   Outpatient Encounter Prescriptions as of 07/23/2015  Medication Sig  . acetaminophen (TYLENOL) 500 MG tablet Take 500 mg by mouth 2 (two) times daily as needed for moderate pain or headache.  . calcium carbonate (OS-CAL) 600 MG TABS tablet Take 600 mg by mouth 2 (two) times daily with a meal.  . cholecalciferol (VITAMIN D) 1000 UNITS tablet Take 1,000 Units by mouth daily.  . furosemide (LASIX) 80 MG tablet Take 1 tablet (80 mg total) by mouth daily.  Marland Kitchen levothyroxine (SYNTHROID, LEVOTHROID) 137 MCG tablet Take 1 tablet (137 mcg total) by mouth daily.  . metFORMIN (GLUCOPHAGE) 500 MG tablet Take 1 tablet (500 mg total) by mouth daily with breakfast.  . Multiple Vitamins-Minerals (MULTIVITAMIN WITH MINERALS) tablet Take 1 tablet by mouth daily.  . potassium chloride SA (K-DUR,KLOR-CON) 20 MEQ tablet Take 20 mEq by mouth 2 (two) times daily.  . verapamil (CALAN-SR) 240 MG CR tablet Take 1 tablet (240 mg total) by mouth 2 (two) times daily.   . [DISCONTINUED] levothyroxine (SYNTHROID, LEVOTHROID) 137 MCG tablet Take 1 tablet (137 mcg total) by mouth daily.  . [DISCONTINUED] metFORMIN (GLUCOPHAGE) 500 MG tablet Take 1 tablet (500 mg total) by mouth daily with breakfast.   No facility-administered encounter medications on file as of 07/23/2015.   ALLERGIES: Allergies  Allergen Reactions  . Dicloxacillin     GI upset   VACCINATION STATUS: Immunization History  Administered Date(s) Administered  . Influenza Whole 02/09/2013  . Influenza,inj,Quad PF,36+ Mos 01/15/2015  . Influenza-Unspecified 01/13/2014  . Pneumococcal Conjugate-13 01/15/2015  . Pneumococcal Polysaccharide-23 01/13/2014    HPI   Danielle Howard is a 65 -yr- old female with medical hx as above. She is here to f/u after a set of TFts. She is on Lt4 137 mcg po qam.  She has no new complaints.  she has steady weight  , she does not exercise. She did  continue on her MTF this time.  she has family hx of diabetes in her mother. she denies dysphagia, SOB, nor voice change.    Review of Systems  Constitutional: Positive for fatigue. Negative for fever, chills and unexpected weight change.  HENT: Negative for trouble swallowing and voice change.   Eyes: Negative for visual disturbance.  Respiratory: Negative for cough, shortness of breath and wheezing.  Cardiovascular: Negative for chest pain, palpitations and leg swelling.  Gastrointestinal: Negative for nausea, vomiting and diarrhea.  Endocrine: Negative for cold intolerance, heat intolerance, polydipsia, polyphagia and polyuria.  Musculoskeletal: Negative for myalgias and arthralgias.  Skin: Negative for color change, pallor, rash and wound.  Neurological: Negative for seizures and headaches.  Psychiatric/Behavioral: Negative for suicidal ideas and confusion.    Objective:    BP 145/81 mmHg  Pulse 70  Ht 5\' 6"  (1.676 m)  Wt 229 lb (103.874 kg)  BMI 36.98 kg/m2  SpO2 97%  Wt Readings from Last 3  Encounters:  07/23/15 229 lb (103.874 kg)  05/30/15 230 lb (104.327 kg)  01/15/15 232 lb (105.235 kg)    Physical Exam  Constitutional: She is oriented to person, place, and time. She appears well-developed.  HENT:  Head: Normocephalic and atraumatic.  Eyes: EOM are normal.  Neck: Normal range of motion. Neck supple. No tracheal deviation present. No thyromegaly present.  Cardiovascular: Normal rate and regular rhythm.   Pulmonary/Chest: Effort normal and breath sounds normal.  Abdominal: Soft. Bowel sounds are normal. There is no tenderness. There is no guarding.  Musculoskeletal: Normal range of motion. She exhibits no edema.  Neurological: She is alert and oriented to person, place, and time. She has normal reflexes. No cranial nerve deficit. Coordination normal.  Skin: Skin is warm and dry. No rash noted. No erythema. No pallor.  Psychiatric: She has a normal mood and affect. Judgment normal.    CMP ( most recent) CMP     Component Value Date/Time   NA 140 05/30/2015 1130   K 3.5 05/30/2015 1130   CL 99 05/30/2015 1130   CO2 31 05/30/2015 1130   GLUCOSE 74 05/30/2015 1130   BUN 12 05/30/2015 1130   CREATININE 0.87 05/30/2015 1130   CALCIUM 9.4 05/30/2015 1130   PROT 7.5 05/30/2015 1130   ALBUMIN 4.0 05/30/2015 1130   AST 21 05/30/2015 1130   ALT 12 05/30/2015 1130   ALKPHOS 93 05/30/2015 1130   BILITOT 0.9 05/30/2015 1130     Diabetic Labs (most recent): Lab Results  Component Value Date   HGBA1C 5.9* 01/16/2015     Lipid Panel ( most recent) Lipid Panel     Component Value Date/Time   CHOL 164 01/16/2015 1005   TRIG 93 01/16/2015 1005   HDL 53 01/16/2015 1005   CHOLHDL 3.1 01/16/2015 1005   VLDL 19 01/16/2015 1005   LDLCALC 92 01/16/2015 1005     Assessment & Plan:   1. Hypothyroidism, unspecified hypothyroidism type  Her repeat TFTs confirm Appropriate  replacement. I will continue levothyroxine 137 g by mouth every morning.  - We discussed about  correct intake of levothyroxine, at fasting, with water, separated by at least 30 minutes from breakfast, and separated by more than 4 hours from calcium, iron, multivitamins, acid reflux medications (PPIs). -Patient is made aware of the fact that thyroid hormone replacement is needed for life, dose to be adjusted by periodic monitoring of thyroid function tests.   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 Her last thyroid u/s showed normal size right lobe with no nodules.  2. Pre-diabetes -Her last a1c has been 6.1 percent increasing from 5.6 %. given her family hx of diabetes, I gave her strict dietary advice. She will still benefit from low dose MTF 500mg  po qday, I advised her to continue .  3. Essential hypertension, benign Continue Verapamil for HTN.  - 25 minutes of time  was spent on the care of this patient , 50% of which was applied for counseling on diabetes complications and their preventions.  - I advised patient to maintain close follow up with Vic Blackbird, MD for primary care needs. Follow up plan: Return in about 6 months (around 01/23/2016) for follow up with pre-visit labs.  Glade Lloyd, MD Phone: [phone]  Fax: [phone]   07/23/2015, 11:08 AM

## 2015-07-23 NOTE — Telephone Encounter (Signed)
insulin needs to be 90 days and supplies

## 2015-07-24 MED ORDER — LEVOTHYROXINE SODIUM 137 MCG PO TABS: 137.0000 ug | ORAL_TABLET | Freq: Every day | ORAL | Status: DC

## 2015-09-12 DIAGNOSIS — Z96653 Presence of artificial knee joint, bilateral: Secondary | ICD-10-CM | POA: Diagnosis not present

## 2015-10-02 ENCOUNTER — Ambulatory Visit (INDEPENDENT_AMBULATORY_CARE_PROVIDER_SITE_OTHER): Payer: Medicare Other | Admitting: Family Medicine

## 2015-10-02 ENCOUNTER — Encounter: Payer: Self-pay | Admitting: Family Medicine

## 2015-10-02 VITALS — BP 132/68 | HR 84 | Temp 98.1°F | Resp 14 | Ht 66.0 in | Wt 231.0 lb

## 2015-10-02 DIAGNOSIS — R7302 Impaired glucose tolerance (oral): Secondary | ICD-10-CM | POA: Diagnosis not present

## 2015-10-02 DIAGNOSIS — E669 Obesity, unspecified: Secondary | ICD-10-CM

## 2015-10-02 DIAGNOSIS — E063 Autoimmune thyroiditis: Secondary | ICD-10-CM

## 2015-10-02 DIAGNOSIS — I10 Essential (primary) hypertension: Secondary | ICD-10-CM | POA: Diagnosis not present

## 2015-10-02 MED ORDER — METFORMIN HCL 500 MG PO TABS: 500.0000 mg | ORAL_TABLET | Freq: Every day | ORAL | Status: DC

## 2015-10-02 MED ORDER — FUROSEMIDE 80 MG PO TABS: 80.0000 mg | ORAL_TABLET | Freq: Every day | ORAL | Status: DC

## 2015-10-02 MED ORDER — VERAPAMIL HCL ER 240 MG PO TBCR: 240.0000 mg | EXTENDED_RELEASE_TABLET | Freq: Two times a day (BID) | ORAL | Status: DC

## 2015-10-02 NOTE — Progress Notes (Signed)
Patient ID: Danielle Howard, female   DOB: 01-Mar-1951, 65 y.o.   MRN: [id number]    Subjective:    Patient ID: Danielle Howard, female    DOB: [birth year], 65 y.o.   MRN: [id number]  Patient presents for 4 month F/U Patient follow chronic medical problems. She has no particular concerns today. She still followed by endocrinology for her hypothyroidism she recently had her medications adjusted for abnormal labs. She still borderline diabetic and she is [facility] she was having some problems with her knee pain she did see her orthopedist who is done bilateral knee replacements who advised her to switch from walking on the treadmill to the bicycle to help with her knee pain she's also given a course of anti-inflammatories. Her last A1c was 6.1% which is actually up from 5.9% back in September 2016. She does admit to eating too many carbs and bread.    Review Of Systems:  GEN- denies fatigue, fever, weight loss,weakness, recent illness HEENT- denies eye drainage, change in vision, nasal discharge, CVS- denies chest pain, palpitations RESP- denies SOB, cough, wheeze ABD- denies N/V, change in stools, abd pain GU- denies dysuria, hematuria, dribbling, incontinence MSK- denies joint pain, muscle aches, injury Neuro- denies headache, dizziness, syncope, seizure activity       Objective:    BP 132/68 mmHg  Pulse 84  Temp(Src) 98.1 F (36.7 C) (Oral)  Resp 14  Ht 5\' 6"  (1.676 m)  Wt 231 lb (104.781 kg)  BMI 37.30 kg/m2 GEN- NAD, alert and oriented x3 HEENT- PERRL, EOMI, non injected sclera, pink conjunctiva, MMM, oropharynx clear CVS- RRR, no murmur RESP-CTAB EXT- Pedal edema Pulses- Radial, DP- 2+        Assessment & Plan:      Problem List Items Addressed This Visit    Obesity   Relevant Medications   metFORMIN (GLUCOPHAGE) 500 MG tablet   Hashimoto's thyroiditis    Continue follow-up with endocrinology      Glucose intolerance  (impaired glucose tolerance)    She's not been taking the metformin on a regular basis. Discussed the importance of this medication to help with weight loss and to keep her her blood sugar down. She has family history of diabetes. She will resume 500 mg once a day with breakfast      Essential hypertension, benign - Primary    Well-controlled and change her medication Continue Lasix for peripheral edema      Relevant Medications   furosemide (LASIX) 80 MG tablet   verapamil (CALAN-SR) 240 MG CR tablet      Note: This dictation was prepared with Dragon dictation along with smaller phrase technology. Any transcriptional errors that result from this process are unintentional.

## 2015-10-02 NOTE — Patient Instructions (Addendum)
F/U end of Sept for Physical  Take the metformin as prescribed

## 2015-10-02 NOTE — Assessment & Plan Note (Signed)
She's not been taking the metformin on a regular basis. Discussed the importance of this medication to help with weight loss and to keep her her blood sugar down. She has family history of diabetes. She will resume 500 mg once a day with breakfast

## 2015-10-02 NOTE — Assessment & Plan Note (Signed)
Continue follow up with endocrinology.

## 2015-10-02 NOTE — Assessment & Plan Note (Signed)
Well-controlled and change her medication Continue Lasix for peripheral edema

## 2015-10-15 ENCOUNTER — Other Ambulatory Visit: Payer: Self-pay | Admitting: Family Medicine

## 2015-10-15 DIAGNOSIS — Z1231 Encounter for screening mammogram for malignant neoplasm of breast: Secondary | ICD-10-CM

## 2015-10-18 ENCOUNTER — Ambulatory Visit (HOSPITAL_COMMUNITY)
Admission: RE | Admit: 2015-10-18 | Discharge: 2015-10-18 | Disposition: A | Discharge status: Home / Self Care | Payer: Medicare Other | Source: Ambulatory Visit | Attending: Family Medicine | Admitting: Family Medicine

## 2015-10-18 DIAGNOSIS — Z1231 Encounter for screening mammogram for malignant neoplasm of breast: Secondary | ICD-10-CM | POA: Insufficient documentation

## 2016-01-08 DIAGNOSIS — M2141 Flat foot [pes planus] (acquired), right foot: Secondary | ICD-10-CM | POA: Diagnosis not present

## 2016-01-08 DIAGNOSIS — M79671 Pain in right foot: Secondary | ICD-10-CM | POA: Diagnosis not present

## 2016-01-08 DIAGNOSIS — R600 Localized edema: Secondary | ICD-10-CM | POA: Diagnosis not present

## 2016-01-16 ENCOUNTER — Other Ambulatory Visit: Payer: Self-pay | Admitting: "Endocrinology

## 2016-01-16 DIAGNOSIS — E039 Hypothyroidism, unspecified: Secondary | ICD-10-CM | POA: Diagnosis not present

## 2016-01-17 LAB — TSH: TSH: 0.17 mIU/L — ABNORMAL LOW

## 2016-01-17 LAB — T4, FREE: Free T4: 1.7 ng/dL (ref 0.8–1.8)

## 2016-01-23 ENCOUNTER — Encounter: Payer: Self-pay | Admitting: "Endocrinology

## 2016-01-23 ENCOUNTER — Ambulatory Visit (INDEPENDENT_AMBULATORY_CARE_PROVIDER_SITE_OTHER): Payer: Medicare Other | Admitting: "Endocrinology

## 2016-01-23 VITALS — BP 132/80 | HR 94 | Ht 66.0 in | Wt 233.0 lb

## 2016-01-23 DIAGNOSIS — E038 Other specified hypothyroidism: Secondary | ICD-10-CM | POA: Diagnosis not present

## 2016-01-23 DIAGNOSIS — E039 Hypothyroidism, unspecified: Secondary | ICD-10-CM | POA: Insufficient documentation

## 2016-01-23 DIAGNOSIS — E669 Obesity, unspecified: Secondary | ICD-10-CM

## 2016-01-23 DIAGNOSIS — I10 Essential (primary) hypertension: Secondary | ICD-10-CM

## 2016-01-23 DIAGNOSIS — R7302 Impaired glucose tolerance (oral): Secondary | ICD-10-CM

## 2016-01-23 DIAGNOSIS — E063 Autoimmune thyroiditis: Secondary | ICD-10-CM | POA: Diagnosis not present

## 2016-01-23 NOTE — Progress Notes (Signed)
Subjective:    Patient ID: Danielle Howard, female    DOB: [birth year], PCP Vic Blackbird, MD   Past Medical History:  Diagnosis Date  . Hypertension   . Hypothyroidism   . Thyroid disease    Past Surgical History:  Procedure Laterality Date  . ABDOMINAL HYSTERECTOMY    . CHOLECYSTECTOMY     02/2012  . COLONOSCOPY N/A 05/25/2013   Procedure: COLONOSCOPY;  Surgeon: Rogene Houston, MD;  Location: [facility];  Service: Endoscopy;  Laterality: N/A;  830-moved to 1055 Ann to notify pt  . JOINT REPLACEMENT Bilateral    2012,2013-bilateral knees  . THYROID SURGERY     Social History   Social History  . Marital status: Single    Spouse name: N/A  . Number of children: N/A  . Years of education: N/A   Social History Main Topics  . Smoking status: Never Smoker  . Smokeless tobacco: Never Used  . Alcohol use Yes     Comment: occasional  . Drug use: No  . Sexual activity: Yes   Other Topics Concern  . None   Social History Narrative  . None   Outpatient Encounter Prescriptions as of 01/23/2016  Medication Sig  . acetaminophen (TYLENOL) 500 MG tablet Take 500 mg by mouth 2 (two) times daily as needed for moderate pain or headache.  . calcium carbonate (OS-CAL) 600 MG TABS tablet Take 600 mg by mouth 2 (two) times daily with a meal.  . cholecalciferol (VITAMIN D) 1000 UNITS tablet Take 1,000 Units by mouth daily.  . furosemide (LASIX) 80 MG tablet Take 1 tablet (80 mg total) by mouth daily.  Marland Kitchen levothyroxine (SYNTHROID, LEVOTHROID) 137 MCG tablet Take 1 tablet (137 mcg total) by mouth daily.  . metFORMIN (GLUCOPHAGE) 500 MG tablet Take 1 tablet (500 mg total) by mouth daily with breakfast.  . Multiple Vitamins-Minerals (MULTIVITAMIN WITH MINERALS) tablet Take 1 tablet by mouth daily.  . potassium chloride SA (K-DUR,KLOR-CON) 20 MEQ tablet Take 20 mEq by mouth 2 (two) times daily.  . verapamil (CALAN-SR) 240 MG CR tablet Take 1 tablet (240 mg total) by mouth 2 (two) times  daily.   No facility-administered encounter medications on file as of 01/23/2016.    ALLERGIES: Allergies  Allergen Reactions  . Dicloxacillin     GI upset   VACCINATION STATUS: Immunization History  Administered Date(s) Administered  . Influenza Whole 02/09/2013  . Influenza,inj,Quad PF,36+ Mos 01/15/2015  . Influenza-Unspecified 01/13/2014  . Pneumococcal Conjugate-13 01/15/2015  . Pneumococcal Polysaccharide-23 01/13/2014    HPI   Danielle Howard is a 65 -yr- old female with medical hx as above. She is here to f/u after a set of TFts. She is on Lt4 137 mcg po qam.  She has no new complaints.  she Is progressively gaining weight  , she does not exercise. She did  continue on her MTF this time.  she has family hx of diabetes in her mother. she denies dysphagia, SOB, nor voice change.    Review of Systems  Constitutional: Positive for fatigue. Negative for chills, fever and unexpected weight change.  HENT: Negative for trouble swallowing and voice change.   Eyes: Negative for visual disturbance.  Respiratory: Negative for cough, shortness of breath and wheezing.   Cardiovascular: Negative for chest pain, palpitations and leg swelling.  Gastrointestinal: Negative for diarrhea, nausea and vomiting.  Endocrine: Negative for cold intolerance, heat intolerance, polydipsia, polyphagia and polyuria.  Musculoskeletal: Negative for arthralgias and myalgias.  Skin: Negative for color change, pallor, rash and wound.  Neurological: Negative for seizures and headaches.  Psychiatric/Behavioral: Negative for confusion and suicidal ideas.    Objective:    BP 132/80   Pulse 94   Ht 5\' 6"  (1.676 m)   Wt 233 lb (105.7 kg)   BMI 37.61 kg/m   Wt Readings from Last 3 Encounters:  01/23/16 233 lb (105.7 kg)  10/02/15 231 lb (104.8 kg)  07/23/15 229 lb (103.9 kg)    Physical Exam  Constitutional: She is oriented to person, place, and time. She appears well-developed.  HENT:  Head:  Normocephalic and atraumatic.  Eyes: EOM are normal.  Neck: Normal range of motion. Neck supple. No tracheal deviation present. No thyromegaly present.  Cardiovascular: Normal rate and regular rhythm.   Pulmonary/Chest: Effort normal and breath sounds normal.  Abdominal: Soft. Bowel sounds are normal. There is no tenderness. There is no guarding.  Musculoskeletal: Normal range of motion. She exhibits no edema.  Neurological: She is alert and oriented to person, place, and time. She has normal reflexes. No cranial nerve deficit. Coordination normal.  Skin: Skin is warm and dry. No rash noted. No erythema. No pallor.  Psychiatric: She has a normal mood and affect. Judgment normal.    CMP     Component Value Date/Time   NA 140 05/30/2015 1130   K 3.5 05/30/2015 1130   CL 99 05/30/2015 1130   CO2 31 05/30/2015 1130   GLUCOSE 74 05/30/2015 1130   BUN 12 05/30/2015 1130   CREATININE 0.87 05/30/2015 1130   CALCIUM 9.4 05/30/2015 1130   PROT 7.5 05/30/2015 1130   ALBUMIN 4.0 05/30/2015 1130   AST 21 05/30/2015 1130   ALT 12 05/30/2015 1130   ALKPHOS 93 05/30/2015 1130   BILITOT 0.9 05/30/2015 1130     Diabetic Labs (most recent): Lab Results  Component Value Date   HGBA1C 6.1 07/16/2015   HGBA1C 5.9 (H) 01/16/2015     Lipid Panel ( most recent) Lipid Panel     Component Value Date/Time   CHOL 164 01/16/2015 1005   TRIG 93 01/16/2015 1005   HDL 53 01/16/2015 1005   CHOLHDL 3.1 01/16/2015 1005   VLDL 19 01/16/2015 1005   LDLCALC 92 01/16/2015 1005     Assessment & Plan:   1. Hypothyroidism, unspecified hypothyroidism type  Her repeat TFTs confirm Appropriate  replacement. I will continue levothyroxine 137 g by mouth every morning.  - We discussed about correct intake of levothyroxine, at fasting, with water, separated by at least 30 minutes from breakfast, and separated by more than 4 hours from calcium, iron, multivitamins, acid reflux medications (PPIs). -Patient is  made aware of the fact that thyroid hormone replacement is needed for life, dose to be adjusted by periodic monitoring of thyroid function tests.   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 Her last thyroid u/s showed normal size right lobe with no nodules.  2. Pre-diabetes -Her last a1c has been 6.1 percent increasing from 5.6 %. given her family hx of diabetes, I gave her strict dietary advice. She will   continue to  benefit from low dose MTF 500mg  po qday, I advised her to continue .  3. Essential hypertension, benign Continue Verapamil for HTN.  - 25 minutes of time was spent on the care of this patient , 50% of which was applied for counseling on diabetes complications and their preventions.  - I advised patient to maintain close  follow up with Vic Blackbird, MD for primary care needs. Follow up plan: Return in about 6 months (around 07/22/2016) for follow up with pre-visit labs.  Glade Lloyd, MD Phone: [phone]  Fax: [phone]   01/23/2016, 12:02 PM

## 2016-01-23 NOTE — Patient Instructions (Signed)

## 2016-01-25 ENCOUNTER — Encounter: Payer: Self-pay | Admitting: Family Medicine

## 2016-01-25 ENCOUNTER — Ambulatory Visit (INDEPENDENT_AMBULATORY_CARE_PROVIDER_SITE_OTHER): Payer: Medicare Other | Admitting: Family Medicine

## 2016-01-25 VITALS — BP 136/74 | HR 88 | Temp 98.4°F | Resp 14 | Ht 66.0 in | Wt 231.0 lb

## 2016-01-25 DIAGNOSIS — E669 Obesity, unspecified: Secondary | ICD-10-CM

## 2016-01-25 DIAGNOSIS — Z23 Encounter for immunization: Secondary | ICD-10-CM

## 2016-01-25 DIAGNOSIS — I10 Essential (primary) hypertension: Secondary | ICD-10-CM | POA: Diagnosis not present

## 2016-01-25 DIAGNOSIS — Z0000 Encounter for general adult medical examination without abnormal findings: Secondary | ICD-10-CM | POA: Diagnosis not present

## 2016-01-25 LAB — CBC WITH DIFFERENTIAL/PLATELET
Basophils Absolute: 0 cells/uL (ref 0–200)
Basophils Relative: 0 %
Eosinophils Absolute: 308 cells/uL (ref 15–500)
Eosinophils Relative: 4 %
HCT: 37.7 % (ref 35.0–45.0)
Hemoglobin: 12.1 g/dL (ref 12.0–15.0)
Lymphocytes Relative: 34 %
Lymphs Abs: 2618 cells/uL (ref 850–3900)
MCH: 27.8 pg (ref 27.0–33.0)
MCHC: 32.1 g/dL (ref 32.0–36.0)
MCV: 86.7 fL (ref 80.0–100.0)
MPV: 9.7 fL (ref 7.5–12.5)
Monocytes Absolute: 693 cells/uL (ref 200–950)
Monocytes Relative: 9 %
Neutro Abs: 4081 cells/uL (ref 1500–7800)
Neutrophils Relative %: 53 %
Platelets: 410 10*3/uL — ABNORMAL HIGH (ref 140–400)
RBC: 4.35 MIL/uL (ref 3.80–5.10)
RDW: 14.1 % (ref 11.0–15.0)
WBC: 7.7 10*3/uL (ref 3.8–10.8)

## 2016-01-25 NOTE — Assessment & Plan Note (Addendum)
Well controlled - she does have extended release version I will try her on one day she will monitor her blood pressure if it goes greater than 140/90 she will need to return to the twice a day dosing. 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

## 2016-01-25 NOTE — Progress Notes (Signed)
Subjective:   Patient presents for Medicare Annual/Subsequent preventive examination.    Patient here for annual wellness exam   She's being followed by endocrinology for her borderline diabetes as well as her thyroid disorder. She is upset she was given so many dietary restrictions to prevent her from becoming diabetic. 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 He also does not want to give up her sweet tea or her Pepsi., She does walk every morning , skips breakfast states she has never eaten breakfast only has coffee  Review of medications still takes her Lasix as needed still gets pedal edema but she does not want to take on certain days when she has different activities. She also was to decrease her blood pressure pills only once a day, denies any dizziness, does not want to take a lot of meds   Review Past Medical/Family/Social: Per EMR   Risk Factors  Current exercise habits: walks daily  Dietary issues discussed: discussed pt does not want to change her diet   Cardiac risk factors: Obesity (BMI >= 30 kg/m2), pre diabetes, HTN   Depression Screen  (Note: if answer to either of the following is "Yes", a more complete depression screening is indicated)  Over the past two weeks, have you felt down, depressed or hopeless? No Over the past two weeks, have you felt little interest or pleasure in doing things? No Have you lost interest or pleasure in daily life? No Do you often feel hopeless? No Do you cry easily over simple problems? No   Activities of Daily Living  In your present state of health, do you have any difficulty performing the following activities?:  Driving? No  Managing money? No  Feeding yourself? No  Getting from bed to chair? No  Climbing a flight of stairs? No  Preparing food and eating?: No  Bathing or  showering? No  Getting dressed: No  Getting to the toilet? No  Using the toilet:No  Moving around from place to place: No  In the past year have you fallen or had a near fall?:No  Are you sexually active?  YES  Do you have more than one partner? No   Hearing Difficulties: No  Do you often ask people to speak up or repeat themselves? No  Do you experience ringing or noises in your ears? Sometimes  Do you have difficulty understanding soft or whispered voices? No  Do you feel that you have a problem with memory? No Do you often misplace items? No  Do you feel safe at home? Yes  Cognitive Testing  Alert? Yes Normal Appearance?Yes  Oriented to person? Yes Place? Yes  Time? Yes  Recall of three objects? Yes  Can perform simple calculations? Yes  Displays appropriate judgment?Yes  Can read the correct time from a watch face?Yes   List the Names of Other Physician/Practitioners you currently use:   Dr. Dorris Fetch   Screening Tests / Date Colonoscopy       UTD             Zostavax  UTD Mammogram  UTD Influenza Vaccine UTD Tetanus/tdap UTD   ROS: GEN- denies fatigue, fever, weight loss,weakness, recent illness HEENT- denies eye drainage, change in vision, nasal discharge, CVS- denies chest pain, palpitations RESP- denies SOB, cough, wheeze ABD- denies  N/V, change in stools, abd pain GU- denies dysuria, hematuria, dribbling, incontinence MSK- denies joint pain, muscle aches, injury Neuro- denies headache, dizziness, syncope, seizure activity  PHYSICAL: GEN- NAD, alert and oriented x3 HEENT- PERRL, EOMI, non injected sclera, pink conjunctiva, MMM, oropharynx clear Neck- Supple, no thryomegaly CVS- RRR, no murmur RESP-CTAB ABD-NABS,soft,NT,ND  EXT- pedal 1+ non pitting  Edema Psych- normal affect and mood Pulses- Radial, DP- 2+    Assessment:    Annual wellness medicare exam   Plan:    During the course of the visit the patient was educated and counseled about  appropriate screening and preventive services including:  Flu shot given   Screen NEG for depression.   Discussed need for living will/ HPoa she is looking into this  Diet review for nutrition referral? Yes ____ Not Indicated __x__  Patient Instructions (the written plan) was given to the patient.  Medicare Attestation  I have personally reviewed:  The patient's medical and social history  Their use of alcohol, tobacco or illicit drugs  Their current medications and supplements  The patient's functional ability including ADLs,fall risks, home safety risks, cognitive, and hearing and visual impairment  Diet and physical activities  Evidence for depression or mood disorders  The patient's weight, height, BMI, and visual acuity have been recorded in the chart. I have made referrals, counseling, and provided education to the patient based on review of the above and I have provided the patient with a written personalized care plan for preventive services.

## 2016-01-25 NOTE — Assessment & Plan Note (Signed)
Discussed healthy eating decreasing her fried foods and her sugar her diet but she is not over to any changes.

## 2016-01-25 NOTE — Patient Instructions (Signed)
If blood pressure is staying above >140/90 go back to 1 tablet twice a day  Flu shot a day  We will call with lab results  F/U 4 months

## 2016-01-26 LAB — COMPREHENSIVE METABOLIC PANEL
ALT: 6 U/L (ref 6–29)
AST: 17 U/L (ref 10–35)
Albumin: 4 g/dL (ref 3.6–5.1)
Alkaline Phosphatase: 82 U/L (ref 33–130)
BUN: 18 mg/dL (ref 7–25)
CO2: 30 mmol/L (ref 20–31)
Calcium: 9.4 mg/dL (ref 8.6–10.4)
Chloride: 101 mmol/L (ref 98–110)
Creat: 1.07 mg/dL — ABNORMAL HIGH (ref 0.50–0.99)
Glucose, Bld: 91 mg/dL (ref 70–99)
Potassium: 3.8 mmol/L (ref 3.5–5.3)
Sodium: 143 mmol/L (ref 135–146)
Total Bilirubin: 0.7 mg/dL (ref 0.2–1.2)
Total Protein: 7.2 g/dL (ref 6.1–8.1)

## 2016-01-26 LAB — LIPID PANEL
Cholesterol: 178 mg/dL (ref 125–200)
HDL: 60 mg/dL (ref >=46)
LDL Cholesterol: 92 mg/dL (ref <130)
Total CHOL/HDL Ratio: 3 Ratio (ref <=5.0)
Triglycerides: 128 mg/dL (ref <150)
VLDL: 26 mg/dL (ref <30)

## 2016-01-29 ENCOUNTER — Other Ambulatory Visit: Payer: Self-pay | Admitting: Family Medicine

## 2016-01-31 ENCOUNTER — Encounter: Payer: Self-pay | Admitting: *Deleted

## 2016-03-07 DIAGNOSIS — E1151 Type 2 diabetes mellitus with diabetic peripheral angiopathy without gangrene: Secondary | ICD-10-CM | POA: Diagnosis not present

## 2016-03-07 DIAGNOSIS — M79673 Pain in unspecified foot: Secondary | ICD-10-CM | POA: Diagnosis not present

## 2016-03-07 DIAGNOSIS — B351 Tinea unguium: Secondary | ICD-10-CM | POA: Diagnosis not present

## 2016-03-31 ENCOUNTER — Other Ambulatory Visit: Payer: Self-pay | Admitting: "Endocrinology

## 2016-05-23 DIAGNOSIS — M722 Plantar fascial fibromatosis: Secondary | ICD-10-CM | POA: Diagnosis not present

## 2016-05-23 DIAGNOSIS — M79672 Pain in left foot: Secondary | ICD-10-CM | POA: Diagnosis not present

## 2016-05-26 ENCOUNTER — Encounter: Payer: Self-pay | Admitting: Family Medicine

## 2016-05-26 ENCOUNTER — Ambulatory Visit (INDEPENDENT_AMBULATORY_CARE_PROVIDER_SITE_OTHER): Payer: Medicare Other | Admitting: Family Medicine

## 2016-05-26 VITALS — BP 132/76 | HR 78 | Temp 98.6°F | Resp 14 | Ht 66.0 in | Wt 232.0 lb

## 2016-05-26 DIAGNOSIS — Z6837 Body mass index (BMI) 37.0-37.9, adult: Secondary | ICD-10-CM | POA: Diagnosis not present

## 2016-05-26 DIAGNOSIS — E6609 Other obesity due to excess calories: Secondary | ICD-10-CM

## 2016-05-26 DIAGNOSIS — I10 Essential (primary) hypertension: Secondary | ICD-10-CM | POA: Diagnosis not present

## 2016-05-26 DIAGNOSIS — R7302 Impaired glucose tolerance (oral): Secondary | ICD-10-CM

## 2016-05-26 DIAGNOSIS — R1031 Right lower quadrant pain: Secondary | ICD-10-CM | POA: Diagnosis not present

## 2016-05-26 DIAGNOSIS — IMO0001 Reserved for inherently not codable concepts without codable children: Secondary | ICD-10-CM

## 2016-05-26 LAB — HEMOGLOBIN A1C
Hgb A1c MFr Bld: 5.3 % (ref <5.7)
Mean Plasma Glucose: 105 mg/dL

## 2016-05-26 LAB — CBC WITH DIFFERENTIAL/PLATELET
Basophils Absolute: 76 cells/uL (ref 0–200)
Basophils Relative: 1 %
Eosinophils Absolute: 304 cells/uL (ref 15–500)
Eosinophils Relative: 4 %
HCT: 36.8 % (ref 35.0–45.0)
Hemoglobin: 11.5 g/dL — ABNORMAL LOW (ref 12.0–15.0)
Lymphocytes Relative: 34 %
Lymphs Abs: 2584 cells/uL (ref 850–3900)
MCH: 27.9 pg (ref 27.0–33.0)
MCHC: 31.3 g/dL — ABNORMAL LOW (ref 32.0–36.0)
MCV: 89.3 fL (ref 80.0–100.0)
MPV: 9.3 fL (ref 7.5–12.5)
Monocytes Absolute: 608 cells/uL (ref 200–950)
Monocytes Relative: 8 %
Neutro Abs: 4028 cells/uL (ref 1500–7800)
Neutrophils Relative %: 53 %
Platelets: 402 10*3/uL — ABNORMAL HIGH (ref 140–400)
RBC: 4.12 MIL/uL (ref 3.80–5.10)
RDW: 14.8 % (ref 11.0–15.0)
WBC: 7.6 10*3/uL (ref 3.8–10.8)

## 2016-05-26 LAB — COMPREHENSIVE METABOLIC PANEL
ALT: 13 U/L (ref 6–29)
AST: 22 U/L (ref 10–35)
Albumin: 3.8 g/dL (ref 3.6–5.1)
Alkaline Phosphatase: 87 U/L (ref 33–130)
BUN: 11 mg/dL (ref 7–25)
CO2: 26 mmol/L (ref 20–31)
Calcium: 9.9 mg/dL (ref 8.6–10.4)
Chloride: 106 mmol/L (ref 98–110)
Creat: 0.91 mg/dL (ref 0.50–0.99)
Glucose, Bld: 86 mg/dL (ref 70–99)
Potassium: 4.3 mmol/L (ref 3.5–5.3)
Sodium: 146 mmol/L (ref 135–146)
Total Bilirubin: 0.7 mg/dL (ref 0.2–1.2)
Total Protein: 7.1 g/dL (ref 6.1–8.1)

## 2016-05-26 MED ORDER — VERAPAMIL HCL ER 240 MG PO TBCR: EXTENDED_RELEASE_TABLET | ORAL | 2 refills | Status: DC

## 2016-05-26 MED ORDER — METFORMIN HCL 500 MG PO TABS: 500.0000 mg | ORAL_TABLET | Freq: Every day | ORAL | 2 refills | Status: DC

## 2016-05-26 MED ORDER — FUROSEMIDE 80 MG PO TABS: 80.0000 mg | ORAL_TABLET | Freq: Every day | ORAL | 2 refills | Status: DC

## 2016-05-26 MED ORDER — POTASSIUM CHLORIDE CRYS ER 20 MEQ PO TBCR: 20.0000 meq | EXTENDED_RELEASE_TABLET | Freq: Two times a day (BID) | ORAL | 2 refills | Status: DC

## 2016-05-26 NOTE — Assessment & Plan Note (Signed)
Controlled no changes 

## 2016-05-26 NOTE — Progress Notes (Signed)
   Subjective:    Patient ID: Danielle Howard, female    DOB: 01-Dec-1950, 66 y.o.   MRN: [id number]  Patient presents for Follow-up (is fasting) Here to  follow-up chronic medical problems. Her medications were reviewed. She's currently on metformin secondary to borderline diabetes mellitus in setting of obesity her last A1c was  Hypertension her blood pressures have been well controlled she is tolerating her medication without any difficulty. Still taking 1 tablet twice a day  Recent lipid panel normal  Hypothyroidism taking Synthroid as prescribed by endocrinology  Seen by podiatry told she has heel spur, had injection done, and given exercises  She has had occasional right lower quadrant pain seems random past few days, typically when sitting in her chair long periods, bowels moving okay, uses laxative some times, no dysuria, s/p hysterectomy, no vaginal discharge, s/p cholecystectomy   Review Of Systems:  GEN- denies fatigue, fever, weight loss,weakness, recent illness HEENT- denies eye drainage, change in vision, nasal discharge, CVS- denies chest pain, palpitations RESP- denies SOB, cough, wheeze ABD- denies N/V, change in stools, +abd pain GU- denies dysuria, hematuria, dribbling, incontinence MSK- denies joint pain, muscle aches, injury Neuro- denies headache, dizziness, syncope, seizure activity       Objective:    BP 132/76 (BP Location: Left Arm, Patient Position: Sitting, Cuff Size: Large)   Pulse 78   Temp 98.6 F (37 C) (Oral)   Resp 14   Ht 5\' 6"  (1.676 m)   Wt 232 lb (105.2 kg)   SpO2 98%   BMI 37.45 kg/m  GEN- NAD, alert and oriented x3 HEENT- PERRL, EOMI, non injected sclera, pink conjunctiva, MMM, oropharynx clear Neck- Supple,   CVS- RRR, no murmur RESP-CTAB ABD-NABS,soft,NT,ND, no CVA tenderness  EXT- No edema Pulses- Radial, DP- 2+        Assessment & Plan:      Problem List Items Addressed This Visit    Obesity    Continue to encourage  exercise, healthy eating       Relevant Medications   metFORMIN (GLUCOPHAGE) 500 MG tablet   Glucose intolerance (impaired glucose tolerance) - Primary    Check A1C on metformin       Relevant Orders   Hemoglobin A1c   Essential hypertension, benign    Controlled no changes       Relevant Medications   furosemide (LASIX) 80 MG tablet   verapamil (CALAN-SR) 240 MG CR tablet   Other Relevant Orders   CBC with Differential/Platelet   Comprehensive metabolic panel    Other Visit Diagnoses    Right lower quadrant abdominal pain       Mild symptoms, she thinks it is from sitting watching TV so more MSK, possible constipation try laxative, no UTI symptoms, normal exam, doubt appendicits      Note: This dictation was prepared with Dragon dictation along with smaller phrase technology. Any transcriptional errors that result from this process are unintentional.

## 2016-05-26 NOTE — Assessment & Plan Note (Signed)
Continue to encourage exercise, healthy eating

## 2016-05-26 NOTE — Patient Instructions (Addendum)
We will call with lab results  F/U 6 months  

## 2016-05-26 NOTE — Assessment & Plan Note (Signed)
Check A1C on metformin

## 2016-06-16 DIAGNOSIS — M2141 Flat foot [pes planus] (acquired), right foot: Secondary | ICD-10-CM | POA: Diagnosis not present

## 2016-06-16 DIAGNOSIS — M79673 Pain in unspecified foot: Secondary | ICD-10-CM | POA: Diagnosis not present

## 2016-06-16 DIAGNOSIS — M722 Plantar fascial fibromatosis: Secondary | ICD-10-CM | POA: Diagnosis not present

## 2016-06-25 ENCOUNTER — Telehealth: Payer: Self-pay | Admitting: Family Medicine

## 2016-06-25 NOTE — Telephone Encounter (Signed)
[place] from [facility] calling to ask a question about her being a diabetic before she comes in for her appointment  [phone]

## 2016-06-25 NOTE — Telephone Encounter (Signed)
Call placed to [facility] office. Was advised that clarification has already been given.

## 2016-07-07 DIAGNOSIS — M722 Plantar fascial fibromatosis: Secondary | ICD-10-CM | POA: Diagnosis not present

## 2016-07-07 DIAGNOSIS — M79673 Pain in unspecified foot: Secondary | ICD-10-CM | POA: Diagnosis not present

## 2016-07-15 ENCOUNTER — Other Ambulatory Visit: Payer: Self-pay | Admitting: "Endocrinology

## 2016-07-15 DIAGNOSIS — E038 Other specified hypothyroidism: Secondary | ICD-10-CM | POA: Diagnosis not present

## 2016-07-15 DIAGNOSIS — R7302 Impaired glucose tolerance (oral): Secondary | ICD-10-CM | POA: Diagnosis not present

## 2016-07-15 DIAGNOSIS — E669 Obesity, unspecified: Secondary | ICD-10-CM | POA: Diagnosis not present

## 2016-07-15 LAB — T4, FREE: Free T4: 1.4 ng/dL (ref 0.8–1.8)

## 2016-07-15 LAB — COMPREHENSIVE METABOLIC PANEL
ALT: 12 U/L (ref 6–29)
AST: 17 U/L (ref 10–35)
Albumin: 3.8 g/dL (ref 3.6–5.1)
Alkaline Phosphatase: 90 U/L (ref 33–130)
BUN: 14 mg/dL (ref 7–25)
CO2: 29 mmol/L (ref 20–31)
Calcium: 9 mg/dL (ref 8.6–10.4)
Chloride: 109 mmol/L (ref 98–110)
Creat: 1 mg/dL — ABNORMAL HIGH (ref 0.50–0.99)
Glucose, Bld: 85 mg/dL (ref 65–99)
Potassium: 4.1 mmol/L (ref 3.5–5.3)
Sodium: 143 mmol/L (ref 135–146)
Total Bilirubin: 0.5 mg/dL (ref 0.2–1.2)
Total Protein: 6.7 g/dL (ref 6.1–8.1)

## 2016-07-15 LAB — LIPID PANEL
Cholesterol: 167 mg/dL (ref <200)
HDL: 70 mg/dL (ref >50)
LDL Cholesterol: 80 mg/dL (ref <100)
Total CHOL/HDL Ratio: 2.4 Ratio (ref <5.0)
Triglycerides: 87 mg/dL (ref <150)
VLDL: 17 mg/dL (ref <30)

## 2016-07-15 LAB — TSH: TSH: 0.75 mIU/L

## 2016-07-16 LAB — HEMOGLOBIN A1C
Hgb A1c MFr Bld: 5.5 % (ref <5.7)
Mean Plasma Glucose: 111 mg/dL

## 2016-07-22 ENCOUNTER — Ambulatory Visit (INDEPENDENT_AMBULATORY_CARE_PROVIDER_SITE_OTHER): Payer: Medicare Other | Admitting: "Endocrinology

## 2016-07-22 ENCOUNTER — Encounter: Payer: Self-pay | Admitting: "Endocrinology

## 2016-07-22 VITALS — BP 148/88 | HR 86 | Ht 66.0 in | Wt 234.0 lb

## 2016-07-22 DIAGNOSIS — E6609 Other obesity due to excess calories: Secondary | ICD-10-CM

## 2016-07-22 DIAGNOSIS — I10 Essential (primary) hypertension: Secondary | ICD-10-CM | POA: Diagnosis not present

## 2016-07-22 DIAGNOSIS — IMO0001 Reserved for inherently not codable concepts without codable children: Secondary | ICD-10-CM

## 2016-07-22 DIAGNOSIS — R7303 Prediabetes: Secondary | ICD-10-CM | POA: Diagnosis not present

## 2016-07-22 DIAGNOSIS — Z6837 Body mass index (BMI) 37.0-37.9, adult: Secondary | ICD-10-CM | POA: Diagnosis not present

## 2016-07-22 DIAGNOSIS — E038 Other specified hypothyroidism: Secondary | ICD-10-CM | POA: Diagnosis not present

## 2016-07-22 DIAGNOSIS — E063 Autoimmune thyroiditis: Secondary | ICD-10-CM

## 2016-07-22 MED ORDER — LEVOTHYROXINE SODIUM 137 MCG PO TABS: ORAL_TABLET | ORAL | 1 refills | Status: DC

## 2016-07-22 NOTE — Progress Notes (Signed)
Subjective:    Patient ID: Danielle Howard, female    DOB: 1950/06/18, PCP Vic Blackbird, MD   Past Medical History:  Diagnosis Date  . Hypertension   . Hypothyroidism   . Thyroid disease    Past Surgical History:  Procedure Laterality Date  . ABDOMINAL HYSTERECTOMY    . CHOLECYSTECTOMY     02/2012  . COLONOSCOPY N/A 05/25/2013   Procedure: COLONOSCOPY;  Surgeon: Rogene Houston, MD;  Location: [facility];  Service: Endoscopy;  Laterality: N/A;  830-moved to 1055 Ann to notify pt  . JOINT REPLACEMENT Bilateral    2012,2013-bilateral knees  . THYROID SURGERY     Social History   Social History  . Marital status: Single    Spouse name: N/A  . Number of children: N/A  . Years of education: N/A   Social History Main Topics  . Smoking status: Never Smoker  . Smokeless tobacco: Never Used  . Alcohol use Yes     Comment: occasional  . Drug use: No  . Sexual activity: Yes   Other Topics Concern  . None   Social History Narrative  . None   Outpatient Encounter Prescriptions as of 07/22/2016  Medication Sig  . diclofenac (VOLTAREN) 75 MG EC tablet Take 75 mg by mouth 2 (two) times daily.  Marland Kitchen acetaminophen (TYLENOL) 500 MG tablet Take 500 mg by mouth 2 (two) times daily as needed for moderate pain or headache.  . calcium carbonate (OS-CAL) 600 MG TABS tablet Take 600 mg by mouth 2 (two) times daily with a meal.  . cholecalciferol (VITAMIN D) 1000 UNITS tablet Take 1,000 Units by mouth daily.  . furosemide (LASIX) 80 MG tablet Take 1 tablet (80 mg total) by mouth daily.  Marland Kitchen levothyroxine (SYNTHROID, LEVOTHROID) 137 MCG tablet TAKE 1 TABLET(137 MCG) BY MOUTH DAILY  . Multiple Vitamins-Minerals (MULTIVITAMIN WITH MINERALS) tablet Take 1 tablet by mouth daily.  . potassium chloride SA (K-DUR,KLOR-CON) 20 MEQ tablet Take 1 tablet (20 mEq total) by mouth 2 (two) times daily.  . verapamil (CALAN-SR) 240 MG CR tablet TAKE 1 TABLET(240 MG) BY MOUTH TWICE DAILY  . [DISCONTINUED]  levothyroxine (SYNTHROID, LEVOTHROID) 137 MCG tablet TAKE 1 TABLET(137 MCG) BY MOUTH DAILY  . [DISCONTINUED] metFORMIN (GLUCOPHAGE) 500 MG tablet Take 1 tablet (500 mg total) by mouth daily with breakfast.   No facility-administered encounter medications on file as of 07/22/2016.    ALLERGIES: Allergies  Allergen Reactions  . Dicloxacillin     GI upset   VACCINATION STATUS: Immunization History  Administered Date(s) Administered  . Influenza Whole 02/09/2013  . Influenza,inj,Quad PF,36+ Mos 01/15/2015, 01/25/2016  . Influenza-Unspecified 01/13/2014  . Pneumococcal Conjugate-13 01/15/2015  . Pneumococcal Polysaccharide-23 01/13/2014    HPI   Danielle Howard is a 66 -yr- old female with medical hx as above. She is here to f/u after a set of TFts. She is on Lt4 137 mcg po qam.  She has no new complaints.  she Is progressively gaining weight  , she does not exercise. - She is not taking her metformin anymore.  she has family hx of diabetes in her mother. she denies dysphagia, SOB, nor voice change.    Review of Systems  Constitutional: Positive for fatigue. Negative for chills, fever and unexpected weight change.  HENT: Negative for trouble swallowing and voice change.   Eyes: Negative for visual disturbance.  Respiratory: Negative for cough, shortness of breath and wheezing.   Cardiovascular: Negative for chest pain,  palpitations and leg swelling.  Gastrointestinal: Negative for diarrhea, nausea and vomiting.  Endocrine: Negative for cold intolerance, heat intolerance, polydipsia, polyphagia and polyuria.  Musculoskeletal: Negative for arthralgias and myalgias.  Skin: Negative for color change, pallor, rash and wound.  Neurological: Negative for seizures and headaches.  Psychiatric/Behavioral: Negative for confusion and suicidal ideas.    Objective:    BP (!) 148/88   Pulse 86   Ht 5\' 6"  (1.676 m)   Wt 234 lb (106.1 kg)   BMI 37.77 kg/m   Wt Readings from Last 3  Encounters:  07/22/16 234 lb (106.1 kg)  05/26/16 232 lb (105.2 kg)  01/25/16 231 lb (104.8 kg)    Physical Exam  Constitutional: She is oriented to person, place, and time. She appears well-developed.  HENT:  Head: Normocephalic and atraumatic.  Eyes: EOM are normal.  Neck: Normal range of motion. Neck supple. No tracheal deviation present. No thyromegaly present.  Cardiovascular: Normal rate and regular rhythm.   Pulmonary/Chest: Effort normal and breath sounds normal.  Abdominal: Soft. Bowel sounds are normal. There is no tenderness. There is no guarding.  Musculoskeletal: Normal range of motion. She exhibits no edema.  Neurological: She is alert and oriented to person, place, and time. She has normal reflexes. No cranial nerve deficit. Coordination normal.  Skin: Skin is warm and dry. No rash noted. No erythema. No pallor.  Psychiatric: She has a normal mood and affect. Judgment normal.    CMP     Component Value Date/Time   NA 143 07/15/2016 0859   K 4.1 07/15/2016 0859   CL 109 07/15/2016 0859   CO2 29 07/15/2016 0859   GLUCOSE 85 07/15/2016 0859   BUN 14 07/15/2016 0859   CREATININE 1.00 (H) 07/15/2016 0859   CALCIUM 9.0 07/15/2016 0859   PROT 6.7 07/15/2016 0859   ALBUMIN 3.8 07/15/2016 0859   AST 17 07/15/2016 0859   ALT 12 07/15/2016 0859   ALKPHOS 90 07/15/2016 0859   BILITOT 0.5 07/15/2016 0859     Diabetic Labs (most recent): Lab Results  Component Value Date   HGBA1C 5.5 07/15/2016   HGBA1C 5.3 05/26/2016   HGBA1C 6.1 07/16/2015     Lipid Panel ( most recent) Lipid Panel     Component Value Date/Time   CHOL 167 07/15/2016 0859   TRIG 87 07/15/2016 0859   HDL 70 07/15/2016 0859   CHOLHDL 2.4 07/15/2016 0859   VLDL 17 07/15/2016 0859   LDLCALC 80 07/15/2016 0859     Assessment & Plan:   1. Hypothyroidism, unspecified hypothyroidism type  Her repeat TFTs confirm Appropriate  replacement. I will continue levothyroxine 137 g by mouth every  morning.  - We discussed about correct intake of levothyroxine, at fasting, with water, separated by at least 30 minutes from breakfast, and separated by more than 4 hours from calcium, iron, multivitamins, acid reflux medications (PPIs). -Patient is made aware of the fact that thyroid hormone replacement is needed for life, dose to be adjusted by periodic monitoring of thyroid function tests.   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 Her last thyroid u/s showed normal size right lobe with no nodules.  2. Pre-diabetes -Her last a1c has proved to 5.5% from 6.1% asked visit.  -  She would  benefit from low dose MTF 500mg  po qday, I advised her to continue .  3. Essential hypertension, benign Continue Verapamil for HTN.  4.  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 I will proceed to obtain follow-up thyroid/neck ultrasound.  - 25 minutes of time was spent on the care of this patient , 50% of which was applied for counseling on diabetes complications and their preventions.  - I advised patient to maintain close follow up with Vic Blackbird, MD for primary care needs. Follow up plan: Return in about 6 months (around 01/22/2017) for follow up with pre-visit labs, Thyroid / Neck Ultrasound.  Glade Lloyd, MD Phone: [phone]  Fax: [phone]   07/22/2016, 11:12 AM

## 2016-08-07 DIAGNOSIS — M79671 Pain in right foot: Secondary | ICD-10-CM | POA: Diagnosis not present

## 2016-08-07 DIAGNOSIS — M2141 Flat foot [pes planus] (acquired), right foot: Secondary | ICD-10-CM | POA: Diagnosis not present

## 2016-08-07 DIAGNOSIS — M722 Plantar fascial fibromatosis: Secondary | ICD-10-CM | POA: Diagnosis not present

## 2016-08-27 ENCOUNTER — Ambulatory Visit (INDEPENDENT_AMBULATORY_CARE_PROVIDER_SITE_OTHER): Payer: Medicare Other | Admitting: Family Medicine

## 2016-08-27 ENCOUNTER — Encounter: Payer: Self-pay | Admitting: Family Medicine

## 2016-08-27 VITALS — BP 138/88 | HR 90 | Temp 98.4°F | Resp 22 | Ht 66.0 in | Wt 239.0 lb

## 2016-08-27 DIAGNOSIS — J069 Acute upper respiratory infection, unspecified: Secondary | ICD-10-CM | POA: Diagnosis not present

## 2016-08-27 DIAGNOSIS — L709 Acne, unspecified: Secondary | ICD-10-CM | POA: Diagnosis not present

## 2016-08-27 LAB — INFLUENZA A AND B AG, IMMUNOASSAY
Influenza A Antigen: NOT DETECTED
Influenza B Antigen: NOT DETECTED

## 2016-08-27 NOTE — Addendum Note (Signed)
Addended by: Sheral Flow on: 08/27/2016 11:25 AM   Modules accepted: Orders

## 2016-08-27 NOTE — Progress Notes (Signed)
   Subjective:    Patient ID: Danielle Howard, female    DOB: May 03, 1951, 66 y.o.   MRN: [id number]  Patient presents for Illness (x3 days- nonproductive cough, nasal draiange, chills, HA, body aches) Pt here with cough non productive ,sore throat, itchy yes, body aches, headaches, chiils, subjective fever for past 3 days took aleve for headache, delsym, and corcidan,  No vomiting, no diarrhea   No sick contacts  She also request referral to dermatology for her acne   Review Of Systems:  GEN- denies fatigue, +fever, weight loss,weakness, recent illness HEENT- denies eye drainage, change in vision, +nasal discharge, CVS- denies chest pain, palpitations RESP- denies SOB, +cough, wheeze ABD- denies N/V, change in stools, abd pain GU- denies dysuria, hematuria, dribbling, incontinence MSK- denies joint pain, +muscle aches, injury Neuro- denies headache, dizziness, syncope, seizure activity       Objective:    BP 138/88   Pulse 90   Temp 98.4 F (36.9 C) (Oral)   Resp (!) 22   Ht 5\' 6"  (1.676 m)   Wt 239 lb (108.4 kg)   SpO2 98%   BMI 38.58 kg/m  GEN- NAD, alert and oriented x3 HEENT- PERRL, EOMI, non injected sclera, pink conjunctiva, MMM, oropharynx clear, nares +rhinorrhea, no maxillary or frontal sinus tenderness, TM clear bilat no effusion  Neck- Supple, no LAD  CVS- RRR, no murmur RESP-CTAB ABD-NABS,soft,NT,ND EXT- No edema Pulses- Radial  2+ Skin- hyperpgiemtned scarring on chin, cheeks, mild acne         Assessment & Plan:      Problem List Items Addressed This Visit    None    Visit Diagnoses    Viral URI    -  Primary   Viral illness, neg flu, supportive care, anti-histamine for drainage, delsym, call if not improved   Relevant Orders   Influenza A & B PCR   Adult acne       Relevant Orders   Ambulatory referral to Dermatology      Note: This dictation was prepared with Dragon dictation along with smaller phrase technology. Any transcriptional errors  that result from this process are unintentional.

## 2016-08-27 NOTE — Patient Instructions (Addendum)
Claritin for drainage Continue delsym Call if not improved Friday/Sat

## 2016-09-02 DIAGNOSIS — L680 Hirsutism: Secondary | ICD-10-CM | POA: Diagnosis not present

## 2016-09-02 DIAGNOSIS — L0202 Furuncle of face: Secondary | ICD-10-CM | POA: Diagnosis not present

## 2016-09-02 DIAGNOSIS — B9689 Other specified bacterial agents as the cause of diseases classified elsewhere: Secondary | ICD-10-CM | POA: Diagnosis not present

## 2016-09-02 DIAGNOSIS — L818 Other specified disorders of pigmentation: Secondary | ICD-10-CM | POA: Diagnosis not present

## 2016-09-03 ENCOUNTER — Other Ambulatory Visit: Payer: Self-pay | Admitting: Family Medicine

## 2016-09-03 DIAGNOSIS — Z1231 Encounter for screening mammogram for malignant neoplasm of breast: Secondary | ICD-10-CM

## 2016-09-28 ENCOUNTER — Other Ambulatory Visit: Payer: Self-pay | Admitting: "Endocrinology

## 2016-10-01 ENCOUNTER — Encounter: Payer: Self-pay | Admitting: Family Medicine

## 2016-10-01 ENCOUNTER — Ambulatory Visit (INDEPENDENT_AMBULATORY_CARE_PROVIDER_SITE_OTHER): Payer: Medicare Other | Admitting: Family Medicine

## 2016-10-01 VITALS — BP 142/82 | HR 84 | Temp 98.1°F | Resp 18

## 2016-10-01 DIAGNOSIS — M19041 Primary osteoarthritis, right hand: Secondary | ICD-10-CM | POA: Diagnosis not present

## 2016-10-01 DIAGNOSIS — M19049 Primary osteoarthritis, unspecified hand: Secondary | ICD-10-CM | POA: Insufficient documentation

## 2016-10-01 DIAGNOSIS — M67449 Ganglion, unspecified hand: Secondary | ICD-10-CM

## 2016-10-01 DIAGNOSIS — M19042 Primary osteoarthritis, left hand: Secondary | ICD-10-CM

## 2016-10-01 NOTE — Patient Instructions (Signed)
F/U as previous 

## 2016-10-01 NOTE — Progress Notes (Signed)
    Subjective:    Patient ID: Danielle Howard, female    DOB: 06-08-1950, 66 y.o.   MRN: [id number]  Patient presents for Lesions on fingers   Pt here with cyst on her left middle finger that has been present for the past few weeks. She also feels nodules on a couple brother fingers (present for quite some time. She only has pain when she gets up against him. He has not been any drainage no tingling or numbness on the fingertips.   Review Of Systems:  GEN- denies fatigue, fever, weight loss,weakness, recent illness HEENT- denies eye drainage, change in vision, nasal discharge, CVS- denies chest pain, palpitations RESP- denies SOB, cough, wheeze ABD- denies N/V, change in stools, abd pain GU- denies dysuria, hematuria, dribbling, incontinence MSK- denies joint pain, muscle aches, injury Neuro- denies headache, dizziness, syncope, seizure activity       Objective:    BP (!) 142/82   Pulse 84   Temp 98.1 F (36.7 C) (Oral)   Resp 18  GEN- NAD, alert and oriented x3 MSK- Left hand middle finger, mucous cyst between DIP and nail, mild TTP, no erythema Other small nodules on bilat at DIP  Procedure- Incision and Drainage Procedure explained to patient questions answered benefits and risks discussed verbal consent obtained. Antiseptic-Betadine Anesthesia-lidocaine 1% Incision performed gel like cyst mateiral expressed Minimal blood loss Antibiotic ointment applied Patient tolerated procedure well Bandage applied         Assessment & Plan:      Problem List Items Addressed This Visit    None    Visit Diagnoses    Digital mucinous cyst of finger    -  Primary   Drained at beside, no sign of superinfection, antibiotic ointent applied, home regimen, epson salt soaks, and antibiotic ointment with occulusive bandage advised may reoccur     Osteoarthritis hands- few other nodules noted as well     Note: This dictation was prepared with Dragon dictation along with smaller  phrase technology. Any transcriptional errors that result from this process are unintentional.

## 2016-10-11 LAB — BASIC METABOLIC PANEL: Glucose: 93

## 2016-10-20 ENCOUNTER — Ambulatory Visit (HOSPITAL_COMMUNITY)
Admission: RE | Admit: 2016-10-20 | Discharge: 2016-10-20 | Disposition: A | Discharge status: Home / Self Care | Payer: Medicare Other | Source: Ambulatory Visit | Attending: Family Medicine | Admitting: Family Medicine

## 2016-10-20 DIAGNOSIS — Z1231 Encounter for screening mammogram for malignant neoplasm of breast: Secondary | ICD-10-CM | POA: Diagnosis not present

## 2016-11-24 ENCOUNTER — Ambulatory Visit (INDEPENDENT_AMBULATORY_CARE_PROVIDER_SITE_OTHER): Payer: Medicare Other | Admitting: Family Medicine

## 2016-11-24 ENCOUNTER — Encounter: Payer: Self-pay | Admitting: Family Medicine

## 2016-11-24 VITALS — BP 134/68 | HR 84 | Temp 98.0°F | Resp 16 | Ht 66.0 in | Wt 237.8 lb

## 2016-11-24 DIAGNOSIS — M542 Cervicalgia: Secondary | ICD-10-CM | POA: Diagnosis not present

## 2016-11-24 DIAGNOSIS — R7303 Prediabetes: Secondary | ICD-10-CM

## 2016-11-24 DIAGNOSIS — Z6837 Body mass index (BMI) 37.0-37.9, adult: Secondary | ICD-10-CM | POA: Diagnosis not present

## 2016-11-24 DIAGNOSIS — R609 Edema, unspecified: Secondary | ICD-10-CM

## 2016-11-24 DIAGNOSIS — I10 Essential (primary) hypertension: Secondary | ICD-10-CM

## 2016-11-24 DIAGNOSIS — I739 Peripheral vascular disease, unspecified: Secondary | ICD-10-CM | POA: Diagnosis not present

## 2016-11-24 NOTE — Assessment & Plan Note (Signed)
Control the change in medication. Her labs are up-to-date

## 2016-11-24 NOTE — Assessment & Plan Note (Signed)
a1c at goal,. Weight down 2lbs continue to encourage dietary changes she is trying to stay active. A lower endocrinology for her thyroid in her blood sugar.

## 2016-11-24 NOTE — Assessment & Plan Note (Signed)
Worsening swelling on that left side although it does improve with her Lasix and use of compression hose. She's also had surgical intervention on the left knee as well. Now she is getting some aching with her daily activities therefore I have her evaluated by vascular disease is anything further they recommend. For now she will continue her Lasix and use her compression hose more regularly.

## 2016-11-24 NOTE — Progress Notes (Signed)
   Subjective:    Patient ID: Danielle Howard, female    DOB: 1950/08/18, 66 y.o.   MRN: [id number]  Patient presents for Follow-up and Hypertension Pt here to f/u chronic medical problems Medications reviewed HTN- taking bP meds as prescribed, no side effects, discussed low salt diet, on verapramil   Following with endocrinology for thyroid   Glucose intolerance was on metformin  A1C  5.5% in march, followed by endocrinology   Peripheral edema- uses lasix , but does not take daily , Left leg is worse than right ,swelling does down, more when she uses her compression hose, but often does not wear unless she is traveling . Also gets pain on top of foot and some aching while walking    Woule like to have re-evaluated   Recent lipids normal   She does take aleve for leg pain , getting some neck pain from back of neck down to left shoulder , started about 2 weeks ago, uses bengay, she does help her mother move around and get up    No meds taken today     Review Of Systems:  GEN- denies fatigue, fever, weight loss,weakness, recent illness HEENT- denies eye drainage, change in vision, nasal discharge, CVS- denies chest pain, palpitations RESP- denies SOB, cough, wheeze ABD- denies N/V, change in stools, abd pain GU- denies dysuria, hematuria, dribbling, incontinence MSK- denies joint pain, muscle aches, injury Neuro- denies headache, dizziness, syncope, seizure activity       Objective:    BP 134/68 (BP Location: Right Arm)   Pulse 84   Temp 98 F (36.7 C)   Resp 16   Ht 5\' 6"  (1.676 m)   Wt 237 lb 12.8 oz (107.9 kg)   SpO2 95%   BMI 38.38 kg/m  GEN- NAD, alert and oriented x3 HEENT- PERRL, EOMI, non injected sclera, pink conjunctiva, MMM, oropharynx clear Neck- Supple, no thyromegaly, noJVD CVS- RRR, no murmur RESP-CTAB ABD-NABS,soft,NT,ND EXT- 1+ EDEMA LLE > than right  Pulses- Radial, DP- 2+        Assessment & Plan:      Problem List Items Addressed This  Visit    Peripheral edema   Obesity   PVD (peripheral vascular disease) ([facility])    Worsening swelling on that left side although it does improve with her Lasix and use of compression hose. She's also had surgical intervention on the left knee as well. Now she is getting some aching with her daily activities therefore I have her evaluated by vascular disease is anything further they recommend. For now she will continue her Lasix and use her compression hose more regularly.      Prediabetes    a1c at goal,. Weight down 2lbs continue to encourage dietary changes she is trying to stay active. A lower endocrinology for her thyroid in her blood sugar.      Essential hypertension, benign - Primary    Control the change in medication. Her labs are up-to-date       Other Visit Diagnoses    Neck pain          Note: This dictation was prepared with Dragon dictation along with smaller phrase technology. Any transcriptional errors that result from this process are unintentional.

## 2016-11-24 NOTE — Patient Instructions (Addendum)
F/U 4 months for Physical  Referral to vascular surgery for swelling  Use compression hose Continue lasix  FOr neck aleve twice a day - call if not improved in 2-3 weeks

## 2016-11-28 ENCOUNTER — Other Ambulatory Visit: Payer: Self-pay | Admitting: Family Medicine

## 2016-12-15 ENCOUNTER — Other Ambulatory Visit: Payer: Self-pay | Admitting: Family Medicine

## 2016-12-15 ENCOUNTER — Ambulatory Visit (INDEPENDENT_AMBULATORY_CARE_PROVIDER_SITE_OTHER): Payer: Medicare Other | Admitting: Family Medicine

## 2016-12-15 ENCOUNTER — Ambulatory Visit (HOSPITAL_COMMUNITY)
Admission: RE | Admit: 2016-12-15 | Discharge: 2016-12-15 | Disposition: A | Discharge status: Home / Self Care | Payer: Medicare Other | Source: Ambulatory Visit | Attending: Family Medicine | Admitting: Family Medicine

## 2016-12-15 ENCOUNTER — Encounter: Payer: Self-pay | Admitting: Family Medicine

## 2016-12-15 ENCOUNTER — Other Ambulatory Visit: Payer: Self-pay | Admitting: *Deleted

## 2016-12-15 VITALS — BP 132/72 | HR 80 | Temp 98.1°F | Resp 16 | Wt 235.0 lb

## 2016-12-15 DIAGNOSIS — M79662 Pain in left lower leg: Secondary | ICD-10-CM | POA: Insufficient documentation

## 2016-12-15 DIAGNOSIS — R609 Edema, unspecified: Secondary | ICD-10-CM | POA: Diagnosis not present

## 2016-12-15 DIAGNOSIS — M7989 Other specified soft tissue disorders: Secondary | ICD-10-CM | POA: Diagnosis not present

## 2016-12-15 MED ORDER — TORSEMIDE 20 MG PO TABS: 20.0000 mg | ORAL_TABLET | Freq: Every day | ORAL | 1 refills | Status: DC

## 2016-12-15 NOTE — Progress Notes (Signed)
   Subjective:    Patient ID: Danielle Howard, female    DOB: 1950/11/06, 66 y.o.   MRN: [id number]  Patient presents for Edema (states that she has been wearing conpression hose and taking 80mg  lasix with no relief)   Patient here with worsening swelling of her left lower leg. She has history of chronic peripheral edema she is on Lasix 80 mg daily with use of compression hose. She is still did not her regular activity she is walking daily. She typically wears her compression hose when she walks. However the past week she's had increased pain and swelling in her left lower extremity only. It is not going down with her diuretic. States that her calf and her foot is very tight. She's not had any significant travel. She has not noted any bruising on the leg does not have any signs of infection. Denies any chest pain shortness of breath   Review Of Systems:  GEN- denies fatigue, fever, weight loss,weakness, recent illness HEENT- denies eye drainage, change in vision, nasal discharge, CVS- denies chest pain, palpitations RESP- denies SOB, cough, wheeze ABD- denies N/V, change in stools, abd pain GU- denies dysuria, hematuria, dribbling, incontinence MSK- denies joint pain, muscle aches, injury Neuro- denies headache, dizziness, syncope, seizure activity       Objective:    BP 132/72   Pulse 80   Temp 98.1 F (36.7 C) (Oral)   Resp 16   Wt 235 lb (106.6 kg) Comment: patient refused  SpO2 99%   BMI 37.93 kg/m  GEN- NAD, alert and oriented x3 HEENT- PERRL, EOMI, non injected sclera, pink conjunctiva, MMM, oropharynx clear Neck- Supple, no JVD CVS- RRR, no murmur RESP-CTAB ABD-NABS,soft,NT,ND EXT- mild pitting edema RLE, 2+ Pitting edema to knee LLE, TTP calf, foot, foot well perfused, no skin color changes, left leg 6cm larger than right at calf Pulses- Radial, DP- 2+        Assessment & Plan:      Problem List Items Addressed This Visit    Peripheral edema    Other Visit  Diagnoses    Pain and swelling of left lower leg    -  Primary   Concern for the increased swelling and pain associated, no spcific risk factor for DVT but need to rule out, send for urgent US today. if neg, change to demadex 20mg  daily, continue potassium and recheck in 1 week. Continue compression hose   Relevant Orders   US Venous Img Lower Unilateral Left      Note: This dictation was prepared with Dragon dictation along with smaller phrase technology. Any transcriptional errors that result from this process are unintentional.

## 2016-12-15 NOTE — Patient Instructions (Signed)
Get ultrasound done We will call with results and further instructions F/U as previous

## 2016-12-22 ENCOUNTER — Encounter: Payer: Self-pay | Admitting: Family Medicine

## 2016-12-22 ENCOUNTER — Ambulatory Visit (INDEPENDENT_AMBULATORY_CARE_PROVIDER_SITE_OTHER): Payer: Medicare Other | Admitting: Family Medicine

## 2016-12-22 VITALS — BP 126/64 | HR 82 | Temp 97.9°F | Resp 14 | Ht 66.0 in | Wt 238.0 lb

## 2016-12-22 DIAGNOSIS — R609 Edema, unspecified: Secondary | ICD-10-CM

## 2016-12-22 DIAGNOSIS — I739 Peripheral vascular disease, unspecified: Secondary | ICD-10-CM | POA: Diagnosis not present

## 2016-12-22 NOTE — Assessment & Plan Note (Signed)
She is getting some improvement with the torsemide dose. I will check her renal function before trying to advance this. I'm have her continue 20 mg once a day states that she is urinating more with this. We have her scheduled to see vascular about a month. She's not had any cardiac or respiratory compromise. She still able to perform her activities. Recommend she wear her compression hose throughout the day Discuss how her diet impacts her fluid retention. The low sodium

## 2016-12-22 NOTE — Progress Notes (Signed)
   Subjective:    Patient ID: Danielle Howard, female    DOB: [birth year], 66 y.o.   MRN: [id number]  Patient presents for Follow-up (is fasting)   Torsemide was started 1 week Go in lieu of Lasix. She had significant left lower [place] edema. Ultrasound was done which didn't rule out DVT. She does have peripheral basilar disease. She has been wearing her compression hose most of the day. She missed the swelling has been going down the by the end of the date is back again. It is not stopping her activities. Her weight is actually up 3 pounds from last week she metastases to eating a lot of junk food salty foods despite our previous conversation No SOB, no Chest pain  Has appt with vascular on Sept   Review Of Systems:  GEN- denies fatigue, fever, weight loss,weakness, recent illness HEENT- denies eye drainage, change in vision, nasal discharge, CVS- denies chest pain, palpitations RESP- denies SOB, cough, wheeze MSK- denies joint pain, muscle aches, injury      Objective:    BP 126/64   Pulse 82   Temp 97.9 F (36.6 C) (Oral)   Resp 14   Ht 5\' 6"  (1.676 m)   Wt 238 lb (108 kg)   SpO2 96%   BMI 38.41 kg/m  GEN- NAD, alert and oriented x3 CVS- RRR, no murmur RESP-CTAB EXT- mild pitting edema RLE, 1+ Pitting edema to knee LLE,  Pulses- Radial, DP- palpated       Assessment & Plan:      Problem List Items Addressed This Visit      Unprioritized   PVD (peripheral vascular disease) ([place])   Peripheral edema - Primary    She is getting some improvement with the torsemide dose. I will check her renal function before trying to advance this. I'm have her continue 20 mg once a day states that she is urinating more with this. We have her scheduled to see vascular about a month. She's not had any cardiac or respiratory compromise. She still able to perform her activities. Recommend she wear her compression hose throughout the day Discuss how her diet impacts her fluid retention. The low  sodium      Relevant Orders   Basic metabolic panel      Note: This dictation was prepared with Dragon dictation along with smaller phrase technology. Any transcriptional errors that result from this process are unintentional.

## 2016-12-22 NOTE — Patient Instructions (Addendum)
COntinue the torsemide with the potassium  F/U as previous

## 2016-12-23 LAB — BASIC METABOLIC PANEL
BUN: 15 mg/dL (ref 7–25)
CO2: 28 mmol/L (ref 20–32)
Calcium: 9.2 mg/dL (ref 8.6–10.4)
Chloride: 107 mmol/L (ref 98–110)
Creat: 1.05 mg/dL — ABNORMAL HIGH (ref 0.50–0.99)
Glucose, Bld: 90 mg/dL (ref 70–99)
Potassium: 4.2 mmol/L (ref 3.5–5.3)
Sodium: 145 mmol/L (ref 135–146)

## 2017-01-14 ENCOUNTER — Ambulatory Visit (HOSPITAL_COMMUNITY)
Admission: RE | Admit: 2017-01-14 | Discharge: 2017-01-14 | Disposition: A | Discharge status: Home / Self Care | Payer: Medicare Other | Source: Ambulatory Visit | Attending: "Endocrinology | Admitting: "Endocrinology

## 2017-01-14 DIAGNOSIS — E0590 Thyrotoxicosis, unspecified without thyrotoxic crisis or storm: Secondary | ICD-10-CM | POA: Diagnosis not present

## 2017-01-14 DIAGNOSIS — E079 Disorder of thyroid, unspecified: Secondary | ICD-10-CM | POA: Diagnosis not present

## 2017-01-14 DIAGNOSIS — E890 Postprocedural hypothyroidism: Secondary | ICD-10-CM | POA: Diagnosis not present

## 2017-01-14 DIAGNOSIS — E038 Other specified hypothyroidism: Secondary | ICD-10-CM | POA: Diagnosis present

## 2017-01-14 DIAGNOSIS — E063 Autoimmune thyroiditis: Secondary | ICD-10-CM | POA: Diagnosis not present

## 2017-01-14 LAB — T4, FREE: Free T4: 1.6 ng/dL (ref 0.8–1.8)

## 2017-01-14 LAB — TSH: TSH: 0.32 mIU/L — ABNORMAL LOW (ref 0.40–4.50)

## 2017-01-16 ENCOUNTER — Other Ambulatory Visit: Payer: Self-pay

## 2017-01-16 DIAGNOSIS — I739 Peripheral vascular disease, unspecified: Secondary | ICD-10-CM

## 2017-01-20 DIAGNOSIS — Z23 Encounter for immunization: Secondary | ICD-10-CM | POA: Diagnosis not present

## 2017-01-22 ENCOUNTER — Ambulatory Visit (INDEPENDENT_AMBULATORY_CARE_PROVIDER_SITE_OTHER): Payer: Medicare Other | Admitting: "Endocrinology

## 2017-01-22 ENCOUNTER — Encounter: Payer: Self-pay | Admitting: "Endocrinology

## 2017-01-22 VITALS — BP 117/70 | HR 74 | Ht 66.0 in | Wt 236.0 lb

## 2017-01-22 DIAGNOSIS — R7303 Prediabetes: Secondary | ICD-10-CM

## 2017-01-22 DIAGNOSIS — E890 Postprocedural hypothyroidism: Secondary | ICD-10-CM | POA: Diagnosis not present

## 2017-01-22 DIAGNOSIS — IMO0001 Reserved for inherently not codable concepts without codable children: Secondary | ICD-10-CM

## 2017-01-22 DIAGNOSIS — Z6837 Body mass index (BMI) 37.0-37.9, adult: Secondary | ICD-10-CM | POA: Diagnosis not present

## 2017-01-22 DIAGNOSIS — I10 Essential (primary) hypertension: Secondary | ICD-10-CM | POA: Diagnosis not present

## 2017-01-22 DIAGNOSIS — E6609 Other obesity due to excess calories: Secondary | ICD-10-CM

## 2017-01-22 MED ORDER — LEVOTHYROXINE SODIUM 137 MCG PO TABS: ORAL_TABLET | ORAL | 1 refills | Status: DC

## 2017-01-22 NOTE — Progress Notes (Signed)
Subjective:    Patient ID: Danielle Howard, female    DOB: Jun 05, 1950, PCP Danielle Rossetti, MD   Past Medical History:  Diagnosis Date  . Hypertension   . Hypothyroidism   . Thyroid disease    Past Surgical History:  Procedure Laterality Date  . ABDOMINAL HYSTERECTOMY    . CHOLECYSTECTOMY     02/2012  . COLONOSCOPY N/A 05/25/2013   Procedure: COLONOSCOPY;  Surgeon: Danielle Houston, MD;  Location: [facility];  Service: Endoscopy;  Laterality: N/A;  830-moved to 1055 Ann to notify pt  . JOINT REPLACEMENT Bilateral    2012,2013-bilateral knees  . THYROID SURGERY     Social History   Social History  . Marital status: Single    Spouse name: N/A  . Number of children: N/A  . Years of education: N/A   Social History Main Topics  . Smoking status: Never Smoker  . Smokeless tobacco: Never Used  . Alcohol use Yes     Comment: occasional  . Drug use: No  . Sexual activity: Yes   Other Topics Concern  . None   Social History Narrative  . None   Outpatient Encounter Prescriptions as of 01/22/2017  Medication Sig  . calcium carbonate (OS-CAL) 600 MG TABS tablet Take 600 mg by mouth 2 (two) times daily with a meal.  . cholecalciferol (VITAMIN D) 1000 UNITS tablet Take 1,000 Units by mouth daily.  . clindamycin (CLEOCIN T) 1 % external solution APPLY TO AFFECTED AREAS BID PRN  . levothyroxine (SYNTHROID, LEVOTHROID) 137 MCG tablet TAKE 1 TABLET(137 MCG) BY MOUTH DAILY  . Multiple Vitamins-Minerals (MULTIVITAMIN WITH MINERALS) tablet Take 1 tablet by mouth daily.  . potassium chloride SA (K-DUR,KLOR-CON) 20 MEQ tablet TAKE 1 TABLET(20 MEQ) BY MOUTH TWICE DAILY  . torsemide (DEMADEX) 20 MG tablet TAKE 1 TABLET(20 MG) BY MOUTH DAILY  . verapamil (CALAN-SR) 240 MG CR tablet TAKE 1 TABLET(240 MG) BY MOUTH TWICE DAILY  . [DISCONTINUED] levothyroxine (SYNTHROID, LEVOTHROID) 137 MCG tablet TAKE 1 TABLET(137 MCG) BY MOUTH DAILY   No facility-administered encounter medications on  file as of 01/22/2017.    ALLERGIES: Allergies  Allergen Reactions  . Dicloxacillin     GI upset   VACCINATION STATUS: Immunization History  Administered Date(s) Administered  . Influenza Whole 02/09/2013  . Influenza,inj,Quad PF,6+ Mos 01/15/2015, 01/25/2016  . Influenza-Unspecified 01/13/2014  . Pneumococcal Conjugate-13 01/15/2015  . Pneumococcal Polysaccharide-23 01/13/2014    HPI   Danielle Howard is a 66 -yr- old female with medical hx as above. She is here to f/u  for postsurgical hypothyroidism, prediabetes, hypertension. She is on levothyroxine 137 mcg po qam. she reports compliance to his medication. She has no new complaints. She reports that she is not taking her metformin for unclear reasons.  she is progressively gaining weight  , she does not exercise.  she has family hx of diabetes in her mother. she denies dysphagia, SOB, nor voice change.    Review of Systems  Constitutional: Positive for fatigue. Negative for chills, fever and unexpected weight change.  HENT: Negative for trouble swallowing and voice change.   Eyes: Negative for visual disturbance.  Respiratory: Negative for cough, shortness of breath and wheezing.   Cardiovascular: Negative for chest pain, palpitations and leg swelling.  Gastrointestinal: Negative for diarrhea, nausea and vomiting.  Endocrine: Negative for cold intolerance, heat intolerance, polydipsia, polyphagia and polyuria.  Musculoskeletal: Negative for arthralgias and myalgias.  Skin: Negative for color change, pallor, rash  and wound.  Neurological: Negative for seizures and headaches.  Psychiatric/Behavioral: Negative for confusion and suicidal ideas.    Objective:    BP 117/70   Pulse 74   Ht 5\' 6"  (1.676 m)   Wt 236 lb (107 kg)   BMI 38.09 kg/m   Wt Readings from Last 3 Encounters:  01/22/17 236 lb (107 kg)  12/22/16 238 lb (108 kg)  12/15/16 235 lb (106.6 kg)    Physical Exam  Constitutional: She is oriented to person,  place, and time. She appears well-developed.  HENT:  Head: Normocephalic and atraumatic.  Eyes: EOM are normal.  Neck: Normal range of motion. Neck supple. No tracheal deviation present. No thyromegaly present.  Cardiovascular: Normal rate and regular rhythm.   Pulmonary/Chest: Effort normal and breath sounds normal.  Abdominal: Soft. Bowel sounds are normal. There is no tenderness. There is no guarding.  Musculoskeletal: Normal range of motion. She exhibits no edema.  Neurological: She is alert and oriented to person, place, and time. She has normal reflexes. No cranial nerve deficit. Coordination normal.  Skin: Skin is warm and dry. No rash noted. No erythema. No pallor.  Psychiatric: She has a normal mood and affect. Judgment normal.    CMP     Component Value Date/Time   NA 145 12/22/2016 0830   K 4.2 12/22/2016 0830   CL 107 12/22/2016 0830   CO2 28 12/22/2016 0830   GLUCOSE 90 12/22/2016 0830   BUN 15 12/22/2016 0830   CREATININE 1.05 (H) 12/22/2016 0830   CALCIUM 9.2 12/22/2016 0830   PROT 6.7 07/15/2016 0859   ALBUMIN 3.8 07/15/2016 0859   AST 17 07/15/2016 0859   ALT 12 07/15/2016 0859   ALKPHOS 90 07/15/2016 0859   BILITOT 0.5 07/15/2016 0859     Diabetic Labs (most recent): Lab Results  Component Value Date   HGBA1C 5.5 07/15/2016   HGBA1C 5.3 05/26/2016   HGBA1C 6.1 07/16/2015     Lipid Panel ( most recent) Lipid Panel     Component Value Date/Time   CHOL 167 07/15/2016 0859   TRIG 87 07/15/2016 0859   HDL 70 07/15/2016 0859   CHOLHDL 2.4 07/15/2016 0859   VLDL 17 07/15/2016 0859   LDLCALC 80 07/15/2016 0859    Results for Danielle, Howard (MRN [id number]) as of 01/22/2017 10:11  Ref. Range 01/14/2017 09:06  TSH Latest Ref Range: 0.40 - 4.50 mIU/L 0.32 (L)  T4,Free(Direct) Latest Ref Range: 0.8 - 1.8 ng/dL 1.6     Assessment & Plan:   1. Hypothyroidism: 2. Hashimoto's thyroiditis 3. Left sided hemithyroidectomy  Her repeat thyroid function tests  are consistent with appropriate replacement. - I will continue levothyroxine 137 g by mouth every morning.  - We discussed about correct intake of levothyroxine, at fasting, with water, separated by at least 30 minutes from breakfast, and separated by more than 4 hours from calcium, iron, multivitamins, acid reflux medications (PPIs). -Patient is made aware of the fact that thyroid hormone replacement is needed for life, dose to be adjusted by periodic monitoring of thyroid function tests.  Her last thyroid u/s showed normal size right lobe with no nodules.  4. Pre-diabetes -Her last a1c has proved to 5.5% from 6.1% asked visit.  -  She would  benefit from low dose metformin 500mg  po qday, I advised her to continue after her cruise trip.  5. Essential hypertension, benign Continue Verapamil for HTN.  6.  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 Surgically absent left lobe of the thyroid.   - I advised patient to maintain close follow up with Danielle Rossetti, MD for primary care needs. Follow up plan: Return in about 6 months (around 07/22/2017) for follow up with pre-visit labs.  Glade Lloyd, MD Phone: [phone]  Fax: [phone]  This note was partially dictated with voice recognition software. Similar sounding words can be transcribed inadequately or may not  be corrected upon review.  01/22/2017, 11:05 AM

## 2017-01-29 ENCOUNTER — Ambulatory Visit (INDEPENDENT_AMBULATORY_CARE_PROVIDER_SITE_OTHER): Payer: Medicare Other | Admitting: Vascular Surgery

## 2017-01-29 ENCOUNTER — Encounter: Payer: Self-pay | Admitting: Vascular Surgery

## 2017-01-29 ENCOUNTER — Ambulatory Visit (HOSPITAL_COMMUNITY)
Admission: RE | Admit: 2017-01-29 | Discharge: 2017-01-29 | Disposition: A | Discharge status: Home / Self Care | Payer: Medicare Other | Source: Ambulatory Visit | Attending: Vascular Surgery | Admitting: Vascular Surgery

## 2017-01-29 VITALS — BP 140/77 | HR 90 | Temp 96.7°F | Resp 16 | Ht 66.0 in | Wt 238.0 lb

## 2017-01-29 DIAGNOSIS — M7989 Other specified soft tissue disorders: Secondary | ICD-10-CM

## 2017-01-29 DIAGNOSIS — I868 Varicose veins of other specified sites: Secondary | ICD-10-CM

## 2017-01-29 DIAGNOSIS — I739 Peripheral vascular disease, unspecified: Secondary | ICD-10-CM | POA: Diagnosis not present

## 2017-01-29 NOTE — Progress Notes (Signed)
Referring Physician: Dr Buelah Manis Patient name: Danielle Howard MRN: [id number] DOB: 22-Jan-1951 Sex: female  REASON FOR CONSULT: left leg swelling  HPI: Danielle Howard is a 66 y.o. female, with a several year history of bilateral leg swelling. Left leg is worse than the right. She states she always had some trouble with swelling in the left leg. However this became worse after having both knees replaced in 2013. She has intermittently had some leg swelling in both legs since that time. She states that her leg swelling is somewhat improved with diuretic. She has worn compression stockings in the past intermittently but not recently. She stated that she did get some benefit from this. She denies any prior history of DVT. She had a venous duplex ultrasound at [facility] a few weeks ago which showed no evidence of DVT and no significant reflux in the deep venous system. She denies any claudication symptoms. She is able walk several miles without pain in her legs. He denies any nonhealing wounds in her feet. Other medical problems include hypertension and hypothyroidism which has been stable.  Past Medical History:  Diagnosis Date  . Hypertension   . Hypothyroidism   . Thyroid disease    Past Surgical History:  Procedure Laterality Date  . ABDOMINAL HYSTERECTOMY    . CHOLECYSTECTOMY     02/2012  . COLONOSCOPY N/A 05/25/2013   Procedure: COLONOSCOPY;  Surgeon: Rogene Houston, MD;  [facility];  Service: Endoscopy;  Laterality: N/A;  830-moved to 1055 Ann to notify pt  . JOINT REPLACEMENT Bilateral    2012,2013-bilateral knees  . THYROID SURGERY      Family History  Problem Relation Age of Onset  . Arthritis Mother   . Heart disease Mother   . Hypertension Mother   . Arthritis Father   . Hyperlipidemia Father   . HIV/AIDS Brother     SOCIAL HISTORY: Social History   Social History  . Marital status: Single    Spouse name: N/A  . Number of children: N/A  . Years of education:  N/A   Occupational History  . Not on file.   Social History Main Topics  . Smoking status: Never Smoker  . Smokeless tobacco: Never Used  . Alcohol use Yes     Comment: occasional  . Drug use: No  . Sexual activity: Yes   Other Topics Concern  . Not on file   Social History Narrative  . No narrative on file    Allergies  Allergen Reactions  . Dicloxacillin     GI upset    Current Outpatient Prescriptions  Medication Sig Dispense Refill  . calcium carbonate (OS-CAL) 600 MG TABS tablet Take 600 mg by mouth 2 (two) times daily with a meal.    . cholecalciferol (VITAMIN D) 1000 UNITS tablet Take 1,000 Units by mouth daily.    . clindamycin (CLEOCIN T) 1 % external solution APPLY TO AFFECTED AREAS BID PRN  0  . levothyroxine (SYNTHROID, LEVOTHROID) 137 MCG tablet TAKE 1 TABLET(137 MCG) BY MOUTH DAILY 90 tablet 1  . Multiple Vitamins-Minerals (MULTIVITAMIN WITH MINERALS) tablet Take 1 tablet by mouth daily.    . potassium chloride SA (K-DUR,KLOR-CON) 20 MEQ tablet TAKE 1 TABLET(20 MEQ) BY MOUTH TWICE DAILY 180 tablet 0  . torsemide (DEMADEX) 20 MG tablet TAKE 1 TABLET(20 MG) BY MOUTH DAILY 90 tablet 1  . verapamil (CALAN-SR) 240 MG CR tablet TAKE 1 TABLET(240 MG) BY MOUTH TWICE DAILY 180 tablet 2  No current facility-administered medications for this visit.     ROS:   General:  No weight loss, Fever, chills  HEENT: No recent headaches, no nasal bleeding, no visual changes, no sore throat  Neurologic: No dizziness, blackouts, seizures. No recent symptoms of stroke or mini- stroke. No recent episodes of slurred speech, or temporary blindness.  Cardiac: No recent episodes of chest pain/pressure, no shortness of breath at rest.  No shortness of breath with exertion.  Denies history of atrial fibrillation or irregular heartbeat  Vascular: No history of rest pain in feet.  No history of claudication.  No history of non-healing ulcer, No history of DVT   Pulmonary: No home  oxygen, no productive cough, no hemoptysis,  No asthma or wheezing  Musculoskeletal:  [X]  Arthritis, [ ]  Low back pain,  [X]  Joint pain  Hematologic:No history of hypercoagulable state.  No history of easy bleeding.  No history of anemia  Gastrointestinal: No hematochezia or melena,  No gastroesophageal reflux, no trouble swallowing  Urinary: [ ]  chronic Kidney disease, [ ]  on HD - [ ]  MWF or [ ]  TTHS, [ ]  Burning with urination, [ ]  Frequent urination, [ ]  Difficulty urinating;   Skin: No rashes  Psychological: No history of anxiety,  No history of depression   Physical Examination  Vitals:   01/29/17 0941  BP: 140/77  Pulse: 90  Resp: 16  Temp: (!) 96.7 F (35.9 C)  TempSrc: Oral  SpO2: 100%  Weight: 238 lb (108 kg)  Height: 5\' 6"  (1.676 m)    Body mass index is 38.41 kg/m.  General:  Alert and oriented, no acute distress HEENT: Normal Neck: No bruit or JVD Pulmonary: Clear to auscultation bilaterally Cardiac: Regular Rate and Rhythm without murmur Abdomen: Soft, non-tender, non-distended, no mass Skin: No rash Extremity Pulses:  2+ radial, brachial, femoral, dorsalis pedis pulses bilaterally Musculoskeletal: No deformity bilateral well-healed total knee replacement scars, nonpitting edema left lower extremity extending from the knee down to the ankle level not really involving the foot similar swelling pattern on the right leg although less than the left. There is also some slight pretibial warmth but no real erythema.  Neurologic: Upper and lower extremity motor 5/5 and symmetric  DATA:  Patient had bilateral ABIs performed today. They were greater than 1 and triphasic and normal bilaterally.  ASSESSMENT:  Patient with bilateral leg swelling left greater than right. She is now had a venous duplex exam which did not really show any deep vein reflux or evidence of DVT. She had a normal arterial study today and has palpable pedal pulses. I believe probably some  component of her leg swelling is an element of lymphedema. However she may also have some superficial venous reflux as well.  PLAN:  I believe the mainstay of therapy for her is going to be lower extremity compression stockings. We had her fitted for these today. She will wear knee-high 20-30 mm compression. If she is not satisfied with the results of this we could bring her back and do a vein study looking at superficial and deep venous reflux as another possible source of her leg swelling although I do believe that this may not contribute much to her overall treatment plan.  Ruta Hinds, MD [facility] Office: [phone] Pager: [phone]

## 2017-01-29 NOTE — Progress Notes (Addendum)
Per Dr. Oneida Alar, I measured patient's legs for knee high compression stockings.  Both legs seemed swollen but the left leg was larger.  The largest part of the left calf measured 46cm and the largest part of the ankle measured 28cm. I informed patient that she would need a size large.  The patient agreed and bought a pair from our office in color black (20-20mm).  She wore the pair home.   Thurston Hole., LPN

## 2017-03-03 ENCOUNTER — Other Ambulatory Visit: Payer: Self-pay | Admitting: Family Medicine

## 2017-03-31 ENCOUNTER — Encounter: Payer: Self-pay | Admitting: Family Medicine

## 2017-03-31 ENCOUNTER — Ambulatory Visit (INDEPENDENT_AMBULATORY_CARE_PROVIDER_SITE_OTHER): Payer: Medicare Other | Admitting: Family Medicine

## 2017-03-31 ENCOUNTER — Other Ambulatory Visit: Payer: Self-pay

## 2017-03-31 VITALS — BP 132/68 | HR 76 | Temp 98.0°F | Resp 14 | Ht 66.0 in | Wt 238.0 lb

## 2017-03-31 DIAGNOSIS — Z6837 Body mass index (BMI) 37.0-37.9, adult: Secondary | ICD-10-CM

## 2017-03-31 DIAGNOSIS — Z0000 Encounter for general adult medical examination without abnormal findings: Secondary | ICD-10-CM

## 2017-03-31 DIAGNOSIS — I10 Essential (primary) hypertension: Secondary | ICD-10-CM | POA: Diagnosis not present

## 2017-03-31 LAB — COMPREHENSIVE METABOLIC PANEL
AG Ratio: 1.4 (calc) (ref 1.0–2.5)
ALT: 12 U/L (ref 6–29)
AST: 23 U/L (ref 10–35)
Albumin: 4.2 g/dL (ref 3.6–5.1)
Alkaline phosphatase (APISO): 103 U/L (ref 33–130)
BUN/Creatinine Ratio: 13 (calc) (ref 6–22)
BUN: 14 mg/dL (ref 7–25)
CO2: 29 mmol/L (ref 20–32)
Calcium: 9.4 mg/dL (ref 8.6–10.4)
Chloride: 105 mmol/L (ref 98–110)
Creat: 1.08 mg/dL — ABNORMAL HIGH (ref 0.50–0.99)
Globulin: 3 g/dL (calc) (ref 1.9–3.7)
Glucose, Bld: 84 mg/dL (ref 65–99)
Potassium: 4.1 mmol/L (ref 3.5–5.3)
Sodium: 143 mmol/L (ref 135–146)
Total Bilirubin: 0.6 mg/dL (ref 0.2–1.2)
Total Protein: 7.2 g/dL (ref 6.1–8.1)

## 2017-03-31 LAB — CBC WITH DIFFERENTIAL/PLATELET
Basophils Absolute: 37 cells/uL (ref 0–200)
Basophils Relative: 0.6 %
Eosinophils Absolute: 279 cells/uL (ref 15–500)
Eosinophils Relative: 4.5 %
HCT: 38.4 % (ref 35.0–45.0)
Hemoglobin: 13 g/dL (ref 11.7–15.5)
Lymphs Abs: 2034 cells/uL (ref 850–3900)
MCH: 29 pg (ref 27.0–33.0)
MCHC: 33.9 g/dL (ref 32.0–36.0)
MCV: 85.5 fL (ref 80.0–100.0)
MPV: 9.8 fL (ref 7.5–12.5)
Monocytes Relative: 7.1 %
Neutro Abs: 3410 cells/uL (ref 1500–7800)
Neutrophils Relative %: 55 %
Platelets: 389 10*3/uL (ref 140–400)
RBC: 4.49 10*6/uL (ref 3.80–5.10)
RDW: 13.6 % (ref 11.0–15.0)
Total Lymphocyte: 32.8 %
WBC mixed population: 440 cells/uL (ref 200–950)
WBC: 6.2 10*3/uL (ref 3.8–10.8)

## 2017-03-31 LAB — LIPID PANEL
Cholesterol: 189 mg/dL (ref <200)
HDL: 63 mg/dL (ref >50)
LDL Cholesterol (Calc): 103 mg/dL (calc) — ABNORMAL HIGH
Non-HDL Cholesterol (Calc): 126 mg/dL (calc) (ref <130)
Total CHOL/HDL Ratio: 3 (calc) (ref <5.0)
Triglycerides: 135 mg/dL (ref <150)

## 2017-03-31 NOTE — Patient Instructions (Addendum)
F/U 6 months  We will call with lab results  

## 2017-03-31 NOTE — Progress Notes (Signed)
Subjective:   Patient presents for Medicare Annual/Subsequent preventive examination.    No concerns  Due for fasting labs  Pre-diabetes, last A1C  5.5% in March,  she was taken off Metformin in Jan 2018   Seen by vascular for chronic leg edema, ? Lymphedema vs Deep venous reflux, given compression hose 20-45mmhg  Review Past Medical/Family/Social: Per EMR   Risk Factors  Current exercise habits: walks, gym a few days a week  Dietary issues discussed: YES  Cardiac risk factors: Obesity (BMI >= 30 kg/m2). Pre diabetes, HTN  Depression Screen  (Note: if answer to either of the following is "Yes", a more complete depression screening is indicated)  Over the past two weeks, have you felt down, depressed or hopeless? No Over the past two weeks, have you felt little interest or pleasure in doing things? No Have you lost interest or pleasure in daily life? No Do you often feel hopeless? No Do you cry easily over simple problems? No   Activities of Daily Living  In your present state of health, do you have any difficulty performing the following activities?:  Driving? No  Managing money? No  Feeding yourself? No  Getting from bed to chair? No  Climbing a flight of stairs? No  Preparing food and eating?: No  Bathing or showering? No  Getting dressed: No  Getting to the toilet? No  Using the toilet:No  Moving around from place to place: No  In the past year have you fallen or had a near fall?:yes  Are you sexually active? No  Do you have more than one partner? No   Hearing Difficulties: No  Do you often ask people to speak up or repeat themselves? No  Do you experience ringing or noises in your ears? No Do you have difficulty understanding soft or whispered voices? No  Do you feel that you have a problem with memory? No Do you often misplace items? No  Do you feel safe at home? Yes  Cognitive Testing  Alert? Yes Normal Appearance?Yes  Oriented to person? Yes Place? Yes   Time? Yes  Recall of three objects? Yes  Can perform simple calculations? Yes  Displays appropriate judgment?Yes  Can read the correct time from a watch face?Yes   List the Names of Other Physician/Practitioners you currently use:   Endocrinology  Gershon Crane - Eye doctor Orthopedics- Dr. Noemi Chapel    Screening Tests / Date Colonoscopy       UTD              Zostavax - UTD , can not afford Shingrix  Pneumonia- UTD Mammogram UTD Influenza Vaccine  UTD Tetanus/tdap UTD Bone Density- Normal 2016  ROS: GEN- denies fatigue, fever, weight loss,weakness, recent illness HEENT- denies eye drainage, change in vision, nasal discharge, CVS- denies chest pain, palpitations RESP- denies SOB, cough, wheeze ABD- denies N/V, change in stools, abd pain GU- denies dysuria, hematuria, dribbling, incontinence MSK- denies joint pain, muscle aches, injury Neuro- denies headache, dizziness, syncope, seizure activity  PHYSICAL:  GEN- NAD, alert and oriented x3 HEENT- PERRL, EOMI, non injected sclera, pink conjunctiva, MMM, oropharynx clear, TM CLEAR BILAT no effusion  Neck- Supple, no thryomegaly, no bruit  CVS- RRR, no murmur RESP-CTAB ABS-NABS,soft,NT,ND EXT- No edema, compresison hose  Pulses- Radial 2+    Assessment:    Annual wellness medicare exam   Plan:    During the course of the visit the patient was educated and counseled about appropriate screening and preventive services  including:  Prevention UTD Fasting labs obtained   HTN- well controlled  Continue f/u endocrinology for thyroid  Obesity- continue regular exercise, watching diet    Screen Neg  for depression. PHQ- 9 score of 2, no depression noted   Handout given for Advanced directives    Yes ____ Not Indicated __x__  Patient Instructions (the written plan) was given to the patient.  Medicare Attestation  I have personally reviewed:  The patient's medical and social history  Their use of alcohol, tobacco or  illicit drugs  Their current medications and supplements  The patient's functional ability including ADLs,fall risks, home safety risks, cognitive, and hearing and visual impairment  Diet and physical activities  Evidence for depression or mood disorders  The patient's weight, height, BMI, and visual acuity have been recorded in the chart. I have made referrals, counseling, and provided education to the patient based on review of the above and I have provided the patient with a written personalized care plan for preventive services.

## 2017-04-02 ENCOUNTER — Encounter: Payer: Self-pay | Admitting: *Deleted

## 2017-05-05 HISTORY — PX: CATARACT EXTRACTION: SUR2

## 2017-06-03 DIAGNOSIS — Z96653 Presence of artificial knee joint, bilateral: Secondary | ICD-10-CM | POA: Diagnosis not present

## 2017-06-04 ENCOUNTER — Ambulatory Visit (INDEPENDENT_AMBULATORY_CARE_PROVIDER_SITE_OTHER): Payer: Medicare Other | Admitting: Podiatry

## 2017-06-04 ENCOUNTER — Ambulatory Visit (INDEPENDENT_AMBULATORY_CARE_PROVIDER_SITE_OTHER): Payer: Medicare Other

## 2017-06-04 ENCOUNTER — Encounter: Payer: Self-pay | Admitting: Podiatry

## 2017-06-04 VITALS — BP 148/72 | HR 90 | Ht 66.0 in | Wt 240.0 lb

## 2017-06-04 DIAGNOSIS — M722 Plantar fascial fibromatosis: Secondary | ICD-10-CM | POA: Diagnosis not present

## 2017-06-04 DIAGNOSIS — M216X9 Other acquired deformities of unspecified foot: Secondary | ICD-10-CM

## 2017-06-04 NOTE — Patient Instructions (Signed)

## 2017-06-04 NOTE — Progress Notes (Signed)
  Subjective:  Patient ID: Danielle Howard, female    DOB: 1951-02-01,  MRN: [id number]  Chief Complaint  Patient presents with  . Foot Pain    NP  Bilateral heel pain    67 y.o. female presents with the above complaint.  Past Medical History:  Diagnosis Date  . Hypertension   . Hypothyroidism   . Thyroid disease    Past Surgical History:  Procedure Laterality Date  . ABDOMINAL HYSTERECTOMY    . CHOLECYSTECTOMY     02/2012  . COLONOSCOPY N/A 05/25/2013   Procedure: COLONOSCOPY;  Surgeon: Rogene Houston, MD;  Location[facility];  Service: Endoscopy;  Laterality: N/A;  830-moved to 1055 Ann to notify pt  . JOINT REPLACEMENT Bilateral    2012,2013-bilateral knees  . THYROID SURGERY      Current Outpatient Medications:  .  calcium carbonate (OS-CAL) 600 MG TABS tablet, Take 600 mg by mouth 2 (two) times daily with a meal., Disp: , Rfl:  .  cholecalciferol (VITAMIN D) 1000 UNITS tablet, Take 1,000 Units by mouth daily., Disp: , Rfl:  .  clindamycin (CLEOCIN T) 1 % external solution, APPLY TO AFFECTED AREAS BID PRN, Disp: , Rfl: 0 .  levothyroxine (SYNTHROID, LEVOTHROID) 137 MCG tablet, TAKE 1 TABLET(137 MCG) BY MOUTH DAILY, Disp: 90 tablet, Rfl: 1 .  Multiple Vitamins-Minerals (MULTIVITAMIN WITH MINERALS) tablet, Take 1 tablet by mouth daily., Disp: , Rfl:  .  Multiple Vitamins-Minerals (MULTIVITAMIN WITH MINERALS) tablet, Take by mouth., Disp: , Rfl:  .  potassium chloride SA (K-DUR,KLOR-CON) 20 MEQ tablet, TAKE 1 TABLET BY MOUTH TWICE DAILY, Disp: 180 tablet, Rfl: 0 .  torsemide (DEMADEX) 20 MG tablet, TAKE 1 TABLET(20 MG) BY MOUTH DAILY, Disp: 90 tablet, Rfl: 1 .  verapamil (CALAN-SR) 240 MG CR tablet, TAKE 1 TABLET(240 MG) BY MOUTH TWICE DAILY, Disp: 180 tablet, Rfl: 2  Allergies  Allergen Reactions  . Dicloxacillin     GI upset   Review of Systems  Musculoskeletal: Positive for arthralgias and myalgias.  All other systems reviewed and are negative. Objective:    Vitals:   06/04/17 1022  BP: (!) 148/72  Pulse: 90   General AA&O x3. Normal mood and affect.  Vascular Dorsalis pedis and posterior tibial pulses  present 2+ bilaterally  Capillary refill normal to all digits. Pedal hair growth normal.  Neurologic Epicritic sensation grossly present bilaterally.  Dermatologic No open lesions. Interspaces clear of maceration. Nails well groomed and normal in appearance.  Orthopedic: MMT 5/5 in dorsiflexion, plantarflexion, inversion, and eversion bilaterally. Tender to palpation at the calcaneal tuber bilaterally. No pain with calcaneal squeeze bilaterally. Ankle ROM diminished range of motion bilaterally. Silfverskiold Test: positive bilaterally.   Radiographs: Taken and reviewed. No acute fractures. No evidence of calcaneal stress fracture.   Assessment & Plan:  Patient was evaluated and treated and all questions answered.  Plantar Fasciitis, bilaterally - XR reviewed as above.  - Educated on icing and stretching. Instructions given.  - Injection delivered to the plantar fascia as below. - Night splint dispensed.  Procedure: Injection Tendon/Ligament Location: Bilateral plantar fascia at the glabrous junction; medial approach. Skin Prep: Alcohol. Injectate: 1 cc 0.5% marcaine plain, 1 cc dexamethasone phosphate, 0.5 cc kenalog 10. Disposition: Patient tolerated procedure well. Injection site dressed with a band-aid.  Return in about 3 weeks (around 06/25/2017) for Plantar fasciitis.

## 2017-06-25 ENCOUNTER — Encounter: Payer: Self-pay | Admitting: Podiatry

## 2017-06-25 ENCOUNTER — Ambulatory Visit (INDEPENDENT_AMBULATORY_CARE_PROVIDER_SITE_OTHER): Payer: Medicare Other | Admitting: Podiatry

## 2017-06-25 DIAGNOSIS — M722 Plantar fascial fibromatosis: Secondary | ICD-10-CM

## 2017-06-29 NOTE — Progress Notes (Signed)
  Subjective:  Patient ID: Danielle Howard, female    DOB: 08-31-1950,  MRN: [id number]  Chief Complaint  Patient presents with  . Plantar Fasciitis    B/L - "my heels feel better, but I'm having pain in my arches now"    67 y.o. female returns for the above complaint.  She has the pain is better but she is now having pain in the arch of her feet.  Objective:  There were no vitals filed for this visit. General AA&O x3. Normal mood and affect.  Vascular Pedal pulses palpable.  Neurologic Epicritic sensation grossly intact.  Dermatologic No open lesions. Skin normal texture and turgor.  Orthopedic:  And revision of the medial heel to her bilaterally   Assessment & Plan:  Patient was evaluated and treated and all questions answered.  Plantar Fasciitis -Repeat injection L heel.  Procedure: Injection Tendon/Ligament Consent: Verbal consent obtained. Location: Left plantar fascia at the glabrous junction; medial approach. Skin Prep: Alcohol. Injectate: 1 cc 0.5% marcaine plain, 1 cc dexamethasone phosphate, 0.5 cc kenalog 10. Disposition: Patient tolerated procedure well. Injection site dressed with a band-aid.  Return in about 3 weeks (around 07/16/2017) for Plantar fasciitis.

## 2017-07-14 DIAGNOSIS — H524 Presbyopia: Secondary | ICD-10-CM | POA: Diagnosis not present

## 2017-07-14 DIAGNOSIS — H25013 Cortical age-related cataract, bilateral: Secondary | ICD-10-CM | POA: Diagnosis not present

## 2017-07-14 DIAGNOSIS — H5213 Myopia, bilateral: Secondary | ICD-10-CM | POA: Diagnosis not present

## 2017-07-14 DIAGNOSIS — H52203 Unspecified astigmatism, bilateral: Secondary | ICD-10-CM | POA: Diagnosis not present

## 2017-07-14 DIAGNOSIS — H2513 Age-related nuclear cataract, bilateral: Secondary | ICD-10-CM | POA: Diagnosis not present

## 2017-07-15 DIAGNOSIS — E890 Postprocedural hypothyroidism: Secondary | ICD-10-CM | POA: Diagnosis not present

## 2017-07-15 DIAGNOSIS — R7303 Prediabetes: Secondary | ICD-10-CM | POA: Diagnosis not present

## 2017-07-16 ENCOUNTER — Ambulatory Visit (INDEPENDENT_AMBULATORY_CARE_PROVIDER_SITE_OTHER): Payer: Medicare Other | Admitting: Podiatry

## 2017-07-16 DIAGNOSIS — M722 Plantar fascial fibromatosis: Secondary | ICD-10-CM | POA: Diagnosis not present

## 2017-07-16 LAB — TSH: TSH: 1.49 mIU/L (ref 0.40–4.50)

## 2017-07-16 LAB — T4, FREE: Free T4: 1.6 ng/dL (ref 0.8–1.8)

## 2017-07-16 LAB — HEMOGLOBIN A1C
Hgb A1c MFr Bld: 5.7 % of total Hgb — ABNORMAL HIGH (ref <5.7)
Mean Plasma Glucose: 117 (calc)
eAG (mmol/L): 6.5 (calc)

## 2017-07-22 ENCOUNTER — Ambulatory Visit (INDEPENDENT_AMBULATORY_CARE_PROVIDER_SITE_OTHER): Payer: Medicare Other | Admitting: "Endocrinology

## 2017-07-22 ENCOUNTER — Encounter: Payer: Self-pay | Admitting: "Endocrinology

## 2017-07-22 VITALS — BP 124/76 | HR 82 | Ht 66.0 in | Wt 237.0 lb

## 2017-07-22 DIAGNOSIS — I10 Essential (primary) hypertension: Secondary | ICD-10-CM

## 2017-07-22 DIAGNOSIS — R7303 Prediabetes: Secondary | ICD-10-CM

## 2017-07-22 DIAGNOSIS — E890 Postprocedural hypothyroidism: Secondary | ICD-10-CM | POA: Diagnosis not present

## 2017-07-22 MED ORDER — METFORMIN HCL 500 MG PO TABS: 500.0000 mg | ORAL_TABLET | Freq: Every day | ORAL | 1 refills | Status: DC

## 2017-07-22 NOTE — Patient Instructions (Signed)

## 2017-07-22 NOTE — Progress Notes (Signed)
Subjective:    Patient ID: Danielle Howard, female    DOB: Aug 01, 1950, PCP [name], MD   Past Medical History:  Diagnosis Date  . Hypertension   . Hypothyroidism   . Thyroid disease    Past Surgical History:  Procedure Laterality Date  . ABDOMINAL HYSTERECTOMY    . CHOLECYSTECTOMY     02/2012  . COLONOSCOPY N/A 05/25/2013   Procedure: COLONOSCOPY;  Surgeon: Rogene Houston, MD;  [facility];  Service: Endoscopy;  Laterality: N/A;  830-moved to 1055 Ann to notify pt  . JOINT REPLACEMENT Bilateral    2012,2013-bilateral knees  . THYROID SURGERY     Social History   Socioeconomic History  . Marital status: Single    Spouse name: None  . Number of children: None  . Years of education: None  . Highest education level: None  Social Needs  . Financial resource strain: None  . Food insecurity - worry: None  . Food insecurity - inability: None  . Transportation needs - medical: None  . Transportation needs - non-medical: None  Occupational History  . None  Tobacco Use  . Smoking status: Never Smoker  . Smokeless tobacco: Never Used  Substance and Sexual Activity  . Alcohol use: Yes    Comment: occasional  . Drug use: No  . Sexual activity: Yes  Other Topics Concern  . None  Social History Narrative  . None   Outpatient Encounter Medications as of 07/22/2017  Medication Sig  . calcium carbonate (OS-CAL) 600 MG TABS tablet Take 600 mg by mouth 2 (two) times daily with a meal.  . cholecalciferol (VITAMIN D) 1000 UNITS tablet Take 1,000 Units by mouth daily.  . clindamycin (CLEOCIN T) 1 % external solution APPLY TO AFFECTED AREAS BID PRN  . levothyroxine (SYNTHROID, LEVOTHROID) 137 MCG tablet TAKE 1 TABLET(137 MCG) BY MOUTH DAILY  . metFORMIN (GLUCOPHAGE) 500 MG tablet Take 1 tablet (500 mg total) by mouth daily after breakfast.  . Multiple Vitamins-Minerals (MULTIVITAMIN WITH MINERALS) tablet Take 1 tablet by mouth daily.  . potassium chloride SA  (K-DUR,KLOR-CON) 20 MEQ tablet TAKE 1 TABLET BY MOUTH TWICE DAILY  . torsemide (DEMADEX) 20 MG tablet TAKE 1 TABLET(20 MG) BY MOUTH DAILY  . verapamil (CALAN-SR) 240 MG CR tablet TAKE 1 TABLET(240 MG) BY MOUTH TWICE DAILY  . [DISCONTINUED] Multiple Vitamins-Minerals (MULTIVITAMIN WITH MINERALS) tablet Take by mouth.   No facility-administered encounter medications on file as of 07/22/2017.    ALLERGIES: Allergies  Allergen Reactions  . Dicloxacillin     GI upset   VACCINATION STATUS: Immunization History  Administered Date(s) Administered  . Influenza Whole 02/09/2013  . Influenza, High Dose Seasonal PF 01/20/2017  . Influenza,inj,Quad PF,6+ Mos 01/15/2015, 01/25/2016  . Influenza-Unspecified 01/13/2014  . Pneumococcal Conjugate-13 01/15/2015  . Pneumococcal Polysaccharide-23 01/13/2014  . Tdap 10/02/2009  . Varicella 03/03/2010    HPI   Danielle Howard is a 67 -yr- old female with medical hx as above.  She is returning to follow-up for postsurgical hypothyroidism, prediabetes, hypertension.    She is on levothyroxine 137 mcg po qam. she reports compliance to his medication. She has no new complaints. She reports that she is not taking her metformin for unclear reasons.  she is progressively gaining weight  , she does not exercise.  she has family hx of diabetes in her mother. she denies dysphagia, SOB, nor voice change.    Review of Systems  Constitutional: Positive for fatigue.  Negative for chills, fever and unexpected weight change.  HENT: Negative for trouble swallowing and voice change.   Eyes: Negative for visual disturbance.  Respiratory: Negative for cough, shortness of breath and wheezing.   Cardiovascular: Negative for chest pain, palpitations and leg swelling.  Gastrointestinal: Negative for diarrhea, nausea and vomiting.  Endocrine: Negative for cold intolerance, heat intolerance, polydipsia, polyphagia and polyuria.  Musculoskeletal: Negative for arthralgias and  myalgias.  Skin: Negative for color change, pallor, rash and wound.  Neurological: Negative for seizures and headaches.  Psychiatric/Behavioral: Negative for confusion and suicidal ideas.    Objective:    BP 124/76   Pulse 82   Ht 5\' 6"  (1.676 m)   Wt 237 lb (107.5 kg)   BMI 38.25 kg/m   Wt Readings from Last 3 Encounters:  07/22/17 237 lb (107.5 kg)  06/04/17 240 lb (108.9 kg)  03/31/17 238 lb (108 kg)    Physical Exam  Constitutional: She is oriented to person, place, and time. She appears well-developed.  HENT:  Head: Normocephalic and atraumatic.  Eyes: EOM are normal.  Neck: Normal range of motion. Neck supple. No tracheal deviation present. No thyromegaly present.  Cardiovascular: Normal rate and regular rhythm.  Pulmonary/Chest: Effort normal and breath sounds normal.  Abdominal: Soft. Bowel sounds are normal. There is no tenderness. There is no guarding.  Musculoskeletal: Normal range of motion. She exhibits no edema.  Neurological: She is alert and oriented to person, place, and time. She has normal reflexes. No cranial nerve deficit. Coordination normal.  Skin: Skin is warm and dry. No rash noted. No erythema. No pallor.  Psychiatric: She has a normal mood and affect. Judgment normal.    CMP     Component Value Date/Time   NA 143 03/31/2017 1029   K 4.1 03/31/2017 1029   CL 105 03/31/2017 1029   CO2 29 03/31/2017 1029   GLUCOSE 84 03/31/2017 1029   BUN 14 03/31/2017 1029   CREATININE 1.08 (H) 03/31/2017 1029   CALCIUM 9.4 03/31/2017 1029   PROT 7.2 03/31/2017 1029   ALBUMIN 3.8 07/15/2016 0859   AST 23 03/31/2017 1029   ALT 12 03/31/2017 1029   ALKPHOS 90 07/15/2016 0859   BILITOT 0.6 03/31/2017 1029     Diabetic Labs (most recent): Lab Results  Component Value Date   HGBA1C 5.7 (H) 07/15/2017   HGBA1C 5.5 07/15/2016   HGBA1C 5.3 05/26/2016     Lipid Panel ( most recent) Lipid Panel     Component Value Date/Time   CHOL 189 03/31/2017 1029    TRIG 135 03/31/2017 1029   HDL 63 03/31/2017 1029   CHOLHDL 3.0 03/31/2017 1029   VLDL 17 07/15/2016 0859   LDLCALC 103 (H) 03/31/2017 1029   Results for HALI, BALGOBIN (MRN [id number]) as of 07/22/2017 15:34  Ref. Range 01/14/2017 09:06 07/15/2017 07:39  TSH Latest Ref Range: 0.40 - 4.50 mIU/L 0.32 (L) 1.49  T4,Free(Direct) Latest Ref Range: 0.8 - 1.8 ng/dL 1.6 1.6      Assessment & Plan:   1. Hypothyroidism: 2. Hashimoto's thyroiditis 3. Left sided hemithyroidectomy  Her recent thyroid function tests are consistent with appropriate replacement.  I advised her to continue levothyroxine 137 mcg p.o. every morning.   - We discussed about correct intake of levothyroxine, at fasting, with water, separated by at least 30 minutes from breakfast, and separated by more than 4 hours from calcium, iron, multivitamins, acid reflux medications (PPIs). -Patient is made aware of the fact that thyroid  hormone replacement is needed for life, dose to be adjusted by periodic monitoring of thyroid function tests.  Her last thyroid u/s showed normal size right lobe with no nodules.  4. Pre-diabetes -Her last a1c is 5.7%.  She would benefit from low-dose metformin to delay the onset of type 2 diabetes.  She agrees to resume metformin 500 mg p.o. daily after breakfast.     5. Essential hypertension, benign Continue Verapamil for HTN.  6.  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 Surgically absent left lobe of the thyroid.   - I advised patient to maintain close follow up with [name], MD for primary care needs. Follow up plan: Return in about 6 months (around 01/22/2018) for follow up with pre-visit labs.  Glade Lloyd, MD Phone: [phone]  Fax: [phone]  This note was partially dictated with voice recognition software. Similar sounding words can be transcribed  inadequately or may not  be corrected upon review.  07/22/2017, 3:33 PM

## 2017-07-23 ENCOUNTER — Other Ambulatory Visit: Payer: Self-pay | Admitting: Family Medicine

## 2017-07-29 DIAGNOSIS — H2513 Age-related nuclear cataract, bilateral: Secondary | ICD-10-CM | POA: Diagnosis not present

## 2017-07-29 DIAGNOSIS — H25013 Cortical age-related cataract, bilateral: Secondary | ICD-10-CM | POA: Diagnosis not present

## 2017-07-30 ENCOUNTER — Other Ambulatory Visit: Payer: Self-pay | Admitting: Family Medicine

## 2017-08-01 NOTE — Progress Notes (Signed)
  Subjective:  Patient ID: Danielle Howard, female    DOB: [birth year],  MRN: [id number]  Chief Complaint  Patient presents with  . Plantar Fasciitis    doing much better - patient does not want anymore injections   67 y.o. female returns for the above complaint.  States that her pain is better she does not want any more injections.  ot  Objective:  There were no vitals filed for this visit. General AA&O x3. Normal mood and affect.  Vascular Pedal pulses palpable.  Neurologic Epicritic sensation grossly intact.  Dermatologic No open lesions. Skin normal texture and turgor.  Orthopedic:  No pain to palpation about the left foot   Assessment & Plan:  Patient was evaluated and treated and all questions answered.  Plantar Fasciitis -Doing better no pain.  Educated on continued stretching and icing -  Return if symptoms worsen or fail to improve.

## 2017-08-05 DIAGNOSIS — H21562 Pupillary abnormality, left eye: Secondary | ICD-10-CM | POA: Diagnosis not present

## 2017-08-05 DIAGNOSIS — H5703 Miosis: Secondary | ICD-10-CM | POA: Diagnosis not present

## 2017-08-05 DIAGNOSIS — H2512 Age-related nuclear cataract, left eye: Secondary | ICD-10-CM | POA: Diagnosis not present

## 2017-08-05 DIAGNOSIS — H25012 Cortical age-related cataract, left eye: Secondary | ICD-10-CM | POA: Diagnosis not present

## 2017-08-06 DIAGNOSIS — T8522XA Displacement of intraocular lens, initial encounter: Secondary | ICD-10-CM | POA: Diagnosis not present

## 2017-08-06 DIAGNOSIS — H43392 Other vitreous opacities, left eye: Secondary | ICD-10-CM | POA: Diagnosis not present

## 2017-08-06 DIAGNOSIS — H43813 Vitreous degeneration, bilateral: Secondary | ICD-10-CM | POA: Diagnosis not present

## 2017-08-06 DIAGNOSIS — H31012 Macula scars of posterior pole (postinflammatory) (post-traumatic), left eye: Secondary | ICD-10-CM | POA: Diagnosis not present

## 2017-08-10 DIAGNOSIS — T8522XA Displacement of intraocular lens, initial encounter: Secondary | ICD-10-CM | POA: Diagnosis not present

## 2017-08-10 DIAGNOSIS — H43392 Other vitreous opacities, left eye: Secondary | ICD-10-CM | POA: Diagnosis not present

## 2017-08-10 DIAGNOSIS — H43813 Vitreous degeneration, bilateral: Secondary | ICD-10-CM | POA: Diagnosis not present

## 2017-08-18 DIAGNOSIS — H43392 Other vitreous opacities, left eye: Secondary | ICD-10-CM | POA: Diagnosis not present

## 2017-08-18 DIAGNOSIS — T8522XA Displacement of intraocular lens, initial encounter: Secondary | ICD-10-CM | POA: Diagnosis not present

## 2017-08-18 DIAGNOSIS — T8522XD Displacement of intraocular lens, subsequent encounter: Secondary | ICD-10-CM | POA: Diagnosis not present

## 2017-09-14 ENCOUNTER — Other Ambulatory Visit: Payer: Self-pay | Admitting: Family Medicine

## 2017-09-14 DIAGNOSIS — Z1231 Encounter for screening mammogram for malignant neoplasm of breast: Secondary | ICD-10-CM

## 2017-09-27 ENCOUNTER — Other Ambulatory Visit: Payer: Self-pay | Admitting: "Endocrinology

## 2017-09-27 ENCOUNTER — Other Ambulatory Visit: Payer: Self-pay | Admitting: Family Medicine

## 2017-09-29 ENCOUNTER — Ambulatory Visit (INDEPENDENT_AMBULATORY_CARE_PROVIDER_SITE_OTHER): Payer: Medicare Other | Admitting: Family Medicine

## 2017-09-29 ENCOUNTER — Encounter: Payer: Self-pay | Admitting: Family Medicine

## 2017-09-29 ENCOUNTER — Other Ambulatory Visit: Payer: Self-pay

## 2017-09-29 VITALS — BP 138/68 | HR 100 | Temp 98.2°F | Resp 12 | Ht 66.0 in | Wt 233.0 lb

## 2017-09-29 DIAGNOSIS — I10 Essential (primary) hypertension: Secondary | ICD-10-CM

## 2017-09-29 DIAGNOSIS — E890 Postprocedural hypothyroidism: Secondary | ICD-10-CM

## 2017-09-29 DIAGNOSIS — Z6837 Body mass index (BMI) 37.0-37.9, adult: Secondary | ICD-10-CM | POA: Diagnosis not present

## 2017-09-29 DIAGNOSIS — I739 Peripheral vascular disease, unspecified: Secondary | ICD-10-CM | POA: Diagnosis not present

## 2017-09-29 DIAGNOSIS — R7303 Prediabetes: Secondary | ICD-10-CM | POA: Diagnosis not present

## 2017-09-29 NOTE — Assessment & Plan Note (Signed)
On metformin daily  Discussed cutting back on sugars

## 2017-09-29 NOTE — Assessment & Plan Note (Signed)
Controlled no changes Due for fasting labs next week

## 2017-09-29 NOTE — Assessment & Plan Note (Signed)
Discussed Dietary changes and exercise Overall doing well, has lost a few pounds

## 2017-09-29 NOTE — Progress Notes (Signed)
   Subjective:    Patient ID: Danielle Howard, female    DOB: 02/01/1951, 67 y.o.   MRN: [id number]  Patient presents for Follow-up (is not fasting)   Pt here to f/u chronic medical problems. Medications reviewed She recently had procedure by Dr. Gershon Crane - had cataract surgery in April, Lens moved, [facility]. Still has redness in left eye, using drops- prednisone  Hypothyroidism- seen by endocrine in March , taking metformin for borderline DM- last A1C 5.7%  Hypothyroidism  HTN- taking bp meds as prescribed, no side effects   Periperal edema/ PVD- using compression, and lasix  Walking every morning ,weight down 7 lbs since January           Review Of Systems:  GEN- denies fatigue, fever, weight loss,weakness, recent illness HEENT- denies eye drainage, change in vision, nasal discharge, CVS- denies chest pain, palpitations RESP- denies SOB, cough, wheeze ABD- denies N/V, change in stools, abd pain GU- denies dysuria, hematuria, dribbling, incontinence MSK- denies joint pain, muscle aches, injury Neuro- denies headache, dizziness, syncope, seizure activity       Objective:    BP 138/68   Pulse 100   Temp 98.2 F (36.8 C) (Oral)   Resp 12   Ht 5\' 6"  (1.676 m)   Wt 233 lb (105.7 kg)   SpO2 98%   BMI 37.61 kg/m  GEN- NAD, alert and oriented x3 HEENT- PERRL, EOMI, Left  injected sclera, pink conjunctiva, MMM, oropharynx clear, Neck- Supple, no thryomegaly CVS- RRR, no murmur RESP-CTAB ABS-NABS,soft,NT,ND EXT- No edema, compresison hose , trace edema  Pulses- Radial 2+        Assessment & Plan:      Problem List Items Addressed This Visit      Unprioritized   Hypothyroidism   PVD (peripheral vascular disease) ([place])    Continue compression hose and lasix      Prediabetes    On metformin daily  Discussed cutting back on sugars      Obesity    Discussed Dietary changes and exercise Overall doing well,  has lost a few pounds       Essential hypertension, benign - Primary    Controlled no changes Due for fasting labs next week          Note: This dictation was prepared with Dragon dictation along with smaller phrase technology. Any transcriptional errors that result from this process are unintentional.

## 2017-09-29 NOTE — Patient Instructions (Signed)
F/U end of Nov OR 1ST week December for Physical

## 2017-09-29 NOTE — Assessment & Plan Note (Signed)
Continue compression hose and lasix.  ? ?

## 2017-10-23 ENCOUNTER — Ambulatory Visit (HOSPITAL_COMMUNITY)
Admission: RE | Admit: 2017-10-23 | Discharge: 2017-10-23 | Disposition: A | Discharge status: Home / Self Care | Payer: Medicare Other | Source: Ambulatory Visit | Attending: Family Medicine | Admitting: Family Medicine

## 2017-10-23 DIAGNOSIS — Z1231 Encounter for screening mammogram for malignant neoplasm of breast: Secondary | ICD-10-CM | POA: Insufficient documentation

## 2017-12-26 ENCOUNTER — Other Ambulatory Visit: Payer: Self-pay | Admitting: "Endocrinology

## 2017-12-26 ENCOUNTER — Other Ambulatory Visit: Payer: Self-pay | Admitting: Family Medicine

## 2017-12-29 DIAGNOSIS — H2511 Age-related nuclear cataract, right eye: Secondary | ICD-10-CM | POA: Insufficient documentation

## 2017-12-29 DIAGNOSIS — H35372 Puckering of macula, left eye: Secondary | ICD-10-CM | POA: Diagnosis not present

## 2017-12-29 DIAGNOSIS — E119 Type 2 diabetes mellitus without complications: Secondary | ICD-10-CM | POA: Diagnosis not present

## 2017-12-29 DIAGNOSIS — H35039 Hypertensive retinopathy, unspecified eye: Secondary | ICD-10-CM | POA: Insufficient documentation

## 2017-12-29 DIAGNOSIS — H35033 Hypertensive retinopathy, bilateral: Secondary | ICD-10-CM | POA: Diagnosis not present

## 2017-12-29 DIAGNOSIS — H20042 Secondary noninfectious iridocyclitis, left eye: Secondary | ICD-10-CM | POA: Diagnosis not present

## 2017-12-29 DIAGNOSIS — H209 Unspecified iridocyclitis: Secondary | ICD-10-CM | POA: Insufficient documentation

## 2017-12-29 DIAGNOSIS — Z961 Presence of intraocular lens: Secondary | ICD-10-CM | POA: Diagnosis not present

## 2017-12-29 HISTORY — DX: Unspecified iridocyclitis: H20.9

## 2018-01-05 ENCOUNTER — Encounter: Payer: Self-pay | Admitting: "Endocrinology

## 2018-01-07 ENCOUNTER — Ambulatory Visit (INDEPENDENT_AMBULATORY_CARE_PROVIDER_SITE_OTHER): Payer: Medicare Other | Admitting: Family Medicine

## 2018-01-07 ENCOUNTER — Ambulatory Visit (HOSPITAL_COMMUNITY)
Admission: RE | Admit: 2018-01-07 | Discharge: 2018-01-07 | Disposition: A | Discharge status: Home / Self Care | Payer: Medicare Other | Source: Ambulatory Visit | Attending: Family Medicine | Admitting: Family Medicine

## 2018-01-07 ENCOUNTER — Encounter: Payer: Self-pay | Admitting: Family Medicine

## 2018-01-07 VITALS — BP 130/70 | HR 93 | Temp 98.1°F | Resp 18 | Ht 66.0 in | Wt 234.0 lb

## 2018-01-07 DIAGNOSIS — R059 Cough, unspecified: Secondary | ICD-10-CM

## 2018-01-07 DIAGNOSIS — R05 Cough: Secondary | ICD-10-CM | POA: Insufficient documentation

## 2018-01-07 DIAGNOSIS — J209 Acute bronchitis, unspecified: Secondary | ICD-10-CM | POA: Diagnosis not present

## 2018-01-07 DIAGNOSIS — J9811 Atelectasis: Secondary | ICD-10-CM | POA: Insufficient documentation

## 2018-01-07 MED ORDER — PREDNISONE 20 MG PO TABS: ORAL_TABLET | ORAL | 0 refills | Status: DC

## 2018-01-07 MED ORDER — AZITHROMYCIN 250 MG PO TABS: ORAL_TABLET | ORAL | 0 refills | Status: DC

## 2018-01-07 MED ORDER — ALBUTEROL SULFATE HFA 108 (90 BASE) MCG/ACT IN AERS: 2.0000 | INHALATION_SPRAY | Freq: Four times a day (QID) | RESPIRATORY_TRACT | 0 refills | Status: DC | PRN

## 2018-01-07 NOTE — Progress Notes (Signed)
Subjective:    Patient ID: Danielle Howard, female    DOB: 1951-02-11, 67 y.o.   MRN: [id number]  HPI Patient presents with a 3-day history of worsening cough, increasing shortness of breath, wheezing, and subjective fevers.  Cough is nonproductive.  On examination, she has decreased breath sounds bilaterally with expiratory wheezes and prolonged expiratory phase.  She states that she is never been told she has asthma or reactive airway disease.  Also she has faint bibasilar crackles right greater than left.  Cough is productive of yellow sputum.  She states she feels extremely weak and tired.  She reports diffuse body aches.  She reports subjective fevers. Past Medical History:  Diagnosis Date  . Hypertension   . Hypothyroidism   . Thyroid disease    Past Surgical History:  Procedure Laterality Date  . ABDOMINAL HYSTERECTOMY    . CHOLECYSTECTOMY     02/2012  . COLONOSCOPY N/A 05/25/2013   Procedure: COLONOSCOPY;  Surgeon: Rogene [place], MD;  Location: [facility];  Service: Endoscopy;  Laterality: N/A;  830-moved to 1055 Ann to notify pt  . JOINT REPLACEMENT Bilateral    2012,2013-bilateral knees  . THYROID SURGERY     Current Outpatient Medications on File Prior to Visit  Medication Sig Dispense Refill  . calcium carbonate (OS-CAL) 600 MG TABS tablet Take 600 mg by mouth 2 (two) times daily with a meal.    . cholecalciferol (VITAMIN D) 1000 UNITS tablet Take 1,000 Units by mouth daily.    . clindamycin (CLEOCIN T) 1 % external solution APPLY TO AFFECTED AREAS BID PRN  0  . levothyroxine (SYNTHROID, LEVOTHROID) 137 MCG tablet TAKE 1 TABLET(137 MCG) BY MOUTH DAILY 90 tablet 0  . metFORMIN (GLUCOPHAGE) 500 MG tablet TAKE 1 TABLET(500 MG) BY MOUTH DAILY AFTER BREAKFAST 90 tablet 0  . Multiple Vitamins-Minerals (MULTIVITAMIN WITH MINERALS) tablet Take 1 tablet by mouth daily.    . potassium chloride SA (K-DUR,KLOR-CON) 20 MEQ tablet TAKE 1 TABLET BY MOUTH TWICE DAILY 180 tablet 0  .  prednisoLONE acetate (PRED FORTE) 1 % ophthalmic suspension SHAKE LQ AND INT 1 GTT IN OS TID  1  . torsemide (DEMADEX) 20 MG tablet TAKE 1 TABLET BY MOUTH DAILY 90 tablet 0  . verapamil (CALAN-SR) 240 MG CR tablet TAKE 1 TABLET(240 MG) BY MOUTH TWICE DAILY 180 tablet 0   No current facility-administered medications on file prior to visit.    Allergies  Allergen Reactions  . Dicloxacillin     GI upset   Social History   Socioeconomic History  . Marital status: Single    Spouse name: Not on file  . Number of children: Not on file  . Years of education: Not on file  . Highest education level: Not on file  Occupational History  . Not on file  Social Needs  . Financial resource strain: Not on file  . Food insecurity:    Worry: Not on file    Inability: Not on file  . Transportation needs:    Medical: Not on file    Non-medical: Not on file  Tobacco Use  . Smoking status: Never Smoker  . Smokeless tobacco: Never Used  Substance and Sexual Activity  . Alcohol use: Yes    Comment: occasional  . Drug use: No  . Sexual activity: Yes  Lifestyle  . Physical activity:    Days per week: Not on file    Minutes per session: Not on file  . Stress:  Not on file  Relationships  . Social connections:    Talks on phone: Not on file    Gets together: Not on file    Attends religious service: Not on file    Active member of club or organization: Not on file    Attends meetings of clubs or organizations: Not on file    Relationship status: Not on file  . Intimate partner violence:    Fear of current or ex partner: Not on file    Emotionally abused: Not on file    Physically abused: Not on file    Forced sexual activity: Not on file  Other Topics Concern  . Not on file  Social History Narrative  . Not on file      Review of Systems  All other systems reviewed and are negative.      Objective:   Physical Exam  HENT:  Right Ear: External ear normal.  Left Ear: External ear  normal.  Nose: Nose normal.  Mouth/Throat: Oropharynx is clear and moist. No oropharyngeal exudate.  Cardiovascular: Normal rate and regular rhythm.  Pulmonary/Chest: Effort normal. She has wheezes. She has rales.  Musculoskeletal: She exhibits no edema.  Vitals reviewed.         Assessment & Plan:  Cough - Plan: DG Chest 2 View  Bronchitis with bronchospasm  Patient clearly is having bronchospasm and wheezing secondary to bronchitis.  Given the bibasilar crackles/Rales, I am concerned about bacterial origin versus even walking pneumonia.  Therefore I will treat the patient with a Z-Pak, prednisone taper pack for the reactive airway disease, and albuterol 2 puffs inhaled every 4-6 hours as needed for wheezing.  Meanwhile asked the patient to get a chest x-ray to rule out bibasilar pneumonia

## 2018-01-21 DIAGNOSIS — E890 Postprocedural hypothyroidism: Secondary | ICD-10-CM | POA: Diagnosis not present

## 2018-01-21 LAB — TSH: TSH: 0.32 mIU/L — ABNORMAL LOW (ref 0.40–4.50)

## 2018-01-21 LAB — T4, FREE: Free T4: 1.8 ng/dL (ref 0.8–1.8)

## 2018-01-22 ENCOUNTER — Ambulatory Visit: Payer: Medicare Other | Admitting: "Endocrinology

## 2018-01-25 DIAGNOSIS — Z880 Allergy status to penicillin: Secondary | ICD-10-CM | POA: Diagnosis not present

## 2018-01-25 DIAGNOSIS — Z9842 Cataract extraction status, left eye: Secondary | ICD-10-CM | POA: Diagnosis not present

## 2018-01-25 DIAGNOSIS — H20042 Secondary noninfectious iridocyclitis, left eye: Secondary | ICD-10-CM | POA: Diagnosis not present

## 2018-01-25 DIAGNOSIS — Z881 Allergy status to other antibiotic agents status: Secondary | ICD-10-CM | POA: Diagnosis not present

## 2018-01-25 DIAGNOSIS — H4602 Optic papillitis, left eye: Secondary | ICD-10-CM | POA: Diagnosis not present

## 2018-01-25 DIAGNOSIS — H35033 Hypertensive retinopathy, bilateral: Secondary | ICD-10-CM | POA: Diagnosis not present

## 2018-01-25 DIAGNOSIS — E1136 Type 2 diabetes mellitus with diabetic cataract: Secondary | ICD-10-CM | POA: Diagnosis not present

## 2018-01-25 DIAGNOSIS — H2511 Age-related nuclear cataract, right eye: Secondary | ICD-10-CM | POA: Diagnosis not present

## 2018-01-25 DIAGNOSIS — E119 Type 2 diabetes mellitus without complications: Secondary | ICD-10-CM | POA: Diagnosis not present

## 2018-01-25 DIAGNOSIS — Z961 Presence of intraocular lens: Secondary | ICD-10-CM | POA: Diagnosis not present

## 2018-01-25 DIAGNOSIS — E11319 Type 2 diabetes mellitus with unspecified diabetic retinopathy without macular edema: Secondary | ICD-10-CM | POA: Diagnosis not present

## 2018-01-25 DIAGNOSIS — H35372 Puckering of macula, left eye: Secondary | ICD-10-CM | POA: Diagnosis not present

## 2018-01-27 ENCOUNTER — Other Ambulatory Visit: Payer: Self-pay | Admitting: Family Medicine

## 2018-01-27 DIAGNOSIS — Z23 Encounter for immunization: Secondary | ICD-10-CM | POA: Diagnosis not present

## 2018-01-28 ENCOUNTER — Encounter: Payer: Self-pay | Admitting: "Endocrinology

## 2018-01-28 ENCOUNTER — Ambulatory Visit (INDEPENDENT_AMBULATORY_CARE_PROVIDER_SITE_OTHER): Payer: Medicare Other | Admitting: "Endocrinology

## 2018-01-28 VITALS — BP 124/73 | HR 96 | Ht 66.0 in | Wt 235.0 lb

## 2018-01-28 DIAGNOSIS — I10 Essential (primary) hypertension: Secondary | ICD-10-CM

## 2018-01-28 DIAGNOSIS — E890 Postprocedural hypothyroidism: Secondary | ICD-10-CM

## 2018-01-28 DIAGNOSIS — E782 Mixed hyperlipidemia: Secondary | ICD-10-CM

## 2018-01-28 DIAGNOSIS — R7303 Prediabetes: Secondary | ICD-10-CM

## 2018-01-28 NOTE — Progress Notes (Signed)
Endocrinology follow-up note   Subjective:    Patient ID: Danielle Howard, female    DOB: 03-Dec-1950, PCP Danielle Rossetti, MD   Past Medical History:  Diagnosis Date  . Hypertension   . Hypothyroidism   . Thyroid disease    Past Surgical History:  Procedure Laterality Date  . ABDOMINAL HYSTERECTOMY    . CHOLECYSTECTOMY     02/2012  . COLONOSCOPY N/A 05/25/2013   Procedure: COLONOSCOPY;  Surgeon: Danielle Houston, MD;  Location: [facility];  Service: Endoscopy;  Laterality: N/A;  830-moved to 1055 Ann to notify pt  . JOINT REPLACEMENT Bilateral    2012,2013-bilateral knees  . THYROID SURGERY     Social History   Socioeconomic History  . Marital status: Single    Spouse name: Not on file  . Number of children: Not on file  . Years of education: Not on file  . Highest education level: Not on file  Occupational History  . Not on file  Social Needs  . Financial resource strain: Not on file  . Food insecurity:    Worry: Not on file    Inability: Not on file  . Transportation needs:    Medical: Not on file    Non-medical: Not on file  Tobacco Use  . Smoking status: Never Smoker  . Smokeless tobacco: Never Used  Substance and Sexual Activity  . Alcohol use: Yes    Comment: occasional  . Drug use: No  . Sexual activity: Yes  Lifestyle  . Physical activity:    Days per week: Not on file    Minutes per session: Not on file  . Stress: Not on file  Relationships  . Social connections:    Talks on phone: Not on file    Gets together: Not on file    Attends religious service: Not on file    Active member of club or organization: Not on file    Attends meetings of clubs or organizations: Not on file    Relationship status: Not on file  Other Topics Concern  . Not on file  Social History Narrative  . Not on file   Outpatient Encounter Medications as of 01/28/2018  Medication Sig  . folic acid (FOLVITE) 1 MG tablet Take by mouth daily.  . [DISCONTINUED]  prednisoLONE acetate (PRED FORTE) 1 % ophthalmic suspension as directed.  Marland Kitchen albuterol (PROVENTIL HFA;VENTOLIN HFA) 108 (90 Base) MCG/ACT inhaler INHALE 2 PUFFS INTO THE LUNGS EVERY 6 HOURS AS NEEDED FOR WHEEZING OR SHORTNESS OF BREATH  . calcium carbonate (OS-CAL) 600 MG TABS tablet Take 600 mg by mouth 2 (two) times daily with a meal.  . cholecalciferol (VITAMIN D) 1000 UNITS tablet Take 1,000 Units by mouth daily.  . clindamycin (CLEOCIN T) 1 % external solution APPLY TO AFFECTED AREAS BID PRN  . levothyroxine (SYNTHROID, LEVOTHROID) 137 MCG tablet TAKE 1 TABLET(137 MCG) BY MOUTH DAILY  . metFORMIN (GLUCOPHAGE) 500 MG tablet TAKE 1 TABLET(500 MG) BY MOUTH DAILY AFTER BREAKFAST  . methotrexate (RHEUMATREX) 2.5 MG tablet 4 tablets once a week.  . Multiple Vitamins-Minerals (MULTIVITAMIN WITH MINERALS) tablet Take 1 tablet by mouth daily.  . potassium chloride SA (K-DUR,KLOR-CON) 20 MEQ tablet TAKE 1 TABLET BY MOUTH TWICE DAILY  . prednisoLONE acetate (PRED FORTE) 1 % ophthalmic suspension SHAKE LQ AND INT 1 GTT IN OS TID  . torsemide (DEMADEX) 20 MG tablet TAKE 1 TABLET BY MOUTH DAILY  . verapamil (CALAN-SR) 240 MG CR tablet TAKE  1 TABLET(240 MG) BY MOUTH TWICE DAILY  . [DISCONTINUED] azithromycin (ZITHROMAX) 250 MG tablet 2 tabs poqday1, 1 tab poqday 2-5  . [DISCONTINUED] predniSONE (DELTASONE) 20 MG tablet 3 tabs poqday 1-2, 2 tabs poqday 3-4, 1 tab poqday 5-6   No facility-administered encounter medications on file as of 01/28/2018.    ALLERGIES: Allergies  Allergen Reactions  . Dicloxacillin     GI upset   VACCINATION STATUS: Immunization History  Administered Date(s) Administered  . Influenza Whole 02/09/2013  . Influenza, High Dose Seasonal PF 01/20/2017  . Influenza,inj,Quad PF,6+ Mos 01/15/2015, 01/25/2016  . Influenza-Unspecified 01/13/2014  . Pneumococcal Conjugate-13 01/15/2015  . Pneumococcal Polysaccharide-23 01/13/2014  . Tdap 10/02/2009  . Varicella 03/03/2010     HPI   Danielle Howard is a 67 year old female patient with medical history as above.    She is returning to follow-up for postsurgical hypothyroidism, prediabetes, hypertension.    She is on levothyroxine 137 mcg po qam. she reports compliance to his medication. She has no new complaints.  She is consistent taking her metformin   she has a steady weight,  she does not exercise.  she has family hx of diabetes in her mother. she denies dysphagia, SOB, nor voice change.    Review of Systems  Constitutional: Positive for fatigue. Negative for chills, fever and unexpected weight change.  HENT: Negative for trouble swallowing and voice change.   Eyes: Negative for visual disturbance.  Respiratory: Negative for cough, shortness of breath and wheezing.   Cardiovascular: Negative for chest pain, palpitations and leg swelling.  Gastrointestinal: Negative for diarrhea, nausea and vomiting.  Endocrine: Negative for cold intolerance, heat intolerance, polydipsia, polyphagia and polyuria.  Musculoskeletal: Negative for arthralgias and myalgias.  Skin: Negative for color change, pallor, rash and wound.  Neurological: Negative for seizures and headaches.  Psychiatric/Behavioral: Negative for confusion and suicidal ideas.    Objective:    BP 124/73   Pulse 96   Ht 5\' 6"  (1.676 m)   Wt 235 lb (106.6 kg)   BMI 37.93 kg/m   Wt Readings from Last 3 Encounters:  01/28/18 235 lb (106.6 kg)  01/07/18 234 lb (106.1 kg)  09/29/17 233 lb (105.7 kg)    Physical Exam  Constitutional: She is oriented to person, place, and time. She appears well-developed.  HENT:  Head: Normocephalic and atraumatic.  Eyes: EOM are normal.  Neck: Normal range of motion. Neck supple. No tracheal deviation present. No thyromegaly present.  Cardiovascular: Normal rate and regular rhythm.  Pulmonary/Chest: Effort normal and breath sounds normal.  Abdominal: Soft. Bowel sounds are normal. There is no tenderness. There is  no guarding.  Musculoskeletal: Normal range of motion. She exhibits no edema.  Neurological: She is alert and oriented to person, place, and time. She has normal reflexes. No cranial nerve deficit. Coordination normal.  Skin: Skin is warm and dry. No rash noted. No erythema. No pallor.  Psychiatric: She has a normal mood and affect. Judgment normal.    Recent Results (from the past 2160 hour(s))  T4, free     Status: None   Collection Time: 01/21/18  8:30 AM  Result Value Ref Range   Free T4 1.8 0.8 - 1.8 ng/dL  TSH     Status: Abnormal   Collection Time: 01/21/18  8:30 AM  Result Value Ref Range   TSH 0.32 (L) 0.40 - 4.50 mIU/L     CMP     Component Value Date/Time   NA 143 03/31/2017 1029  K 4.1 03/31/2017 1029   CL 105 03/31/2017 1029   CO2 29 03/31/2017 1029   GLUCOSE 84 03/31/2017 1029   BUN 14 03/31/2017 1029   CREATININE 1.08 (H) 03/31/2017 1029   CALCIUM 9.4 03/31/2017 1029   PROT 7.2 03/31/2017 1029   ALBUMIN 3.8 07/15/2016 0859   AST 23 03/31/2017 1029   ALT 12 03/31/2017 1029   ALKPHOS 90 07/15/2016 0859   BILITOT 0.6 03/31/2017 1029     Diabetic Labs (most recent): Lab Results  Component Value Date   HGBA1C 5.7 (H) 07/15/2017   HGBA1C 5.5 07/15/2016   HGBA1C 5.3 05/26/2016     Lipid Panel ( most recent) Lipid Panel     Component Value Date/Time   CHOL 189 03/31/2017 1029   TRIG 135 03/31/2017 1029   HDL 63 03/31/2017 1029   CHOLHDL 3.0 03/31/2017 1029   VLDL 17 07/15/2016 0859   LDLCALC 103 (H) 03/31/2017 1029       Assessment & Plan:   1. Hypothyroidism: 2. Hashimoto's thyroiditis 3. Left sided hemithyroidectomy  Her recent thyroid function tests are consistent with appropriate replacement.  -She is advised to continue on her current dose of levothyroxine 137 mcg p.o. every morning.    - We discussed about correct intake of levothyroxine, at fasting, with water, separated by at least 30 minutes from breakfast, and separated by more  than 4 hours from calcium, iron, multivitamins, acid reflux medications (PPIs). -Patient is made aware of the fact that thyroid hormone replacement is needed for life, dose to be adjusted by periodic monitoring of thyroid function tests.  Her last thyroid u/s showed normal size right lobe with no nodules.  4. Pre-diabetes -Her last a1c is 5.7%.  She is advised to continue metformin 500 mg p.o. daily after breakfast.    5. Essential hypertension-controlled to target.   She is advised to continue verapamil 240 mg p.o. Daily.   6.  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 Surgically absent left lobe of the thyroid.   - I advised patient to maintain close follow up with Danielle Rossetti, MD for primary care needs. Follow up plan: Return in about 6 months (around 07/29/2018) for Follow up with Pre-visit Labs.  Glade Lloyd, MD Phone: [phone]  Fax: [phone]  This note was partially dictated with voice recognition software. Similar sounding words can be transcribed inadequately or may not  be corrected upon review.  01/28/2018, 1:23 PM

## 2018-01-28 NOTE — Patient Instructions (Signed)

## 2018-03-09 DIAGNOSIS — Z79899 Other long term (current) drug therapy: Secondary | ICD-10-CM | POA: Diagnosis not present

## 2018-03-09 DIAGNOSIS — H2511 Age-related nuclear cataract, right eye: Secondary | ICD-10-CM | POA: Diagnosis not present

## 2018-03-09 DIAGNOSIS — H35372 Puckering of macula, left eye: Secondary | ICD-10-CM | POA: Diagnosis not present

## 2018-03-09 DIAGNOSIS — E119 Type 2 diabetes mellitus without complications: Secondary | ICD-10-CM | POA: Diagnosis not present

## 2018-03-09 DIAGNOSIS — Z961 Presence of intraocular lens: Secondary | ICD-10-CM | POA: Diagnosis not present

## 2018-03-09 DIAGNOSIS — H35033 Hypertensive retinopathy, bilateral: Secondary | ICD-10-CM | POA: Diagnosis not present

## 2018-03-09 DIAGNOSIS — H20042 Secondary noninfectious iridocyclitis, left eye: Secondary | ICD-10-CM | POA: Diagnosis not present

## 2018-03-23 DIAGNOSIS — Z961 Presence of intraocular lens: Secondary | ICD-10-CM | POA: Diagnosis not present

## 2018-03-23 DIAGNOSIS — H2511 Age-related nuclear cataract, right eye: Secondary | ICD-10-CM | POA: Diagnosis not present

## 2018-03-23 DIAGNOSIS — H35372 Puckering of macula, left eye: Secondary | ICD-10-CM | POA: Diagnosis not present

## 2018-03-23 DIAGNOSIS — Z7984 Long term (current) use of oral hypoglycemic drugs: Secondary | ICD-10-CM | POA: Diagnosis not present

## 2018-03-23 DIAGNOSIS — H20042 Secondary noninfectious iridocyclitis, left eye: Secondary | ICD-10-CM | POA: Diagnosis not present

## 2018-03-23 DIAGNOSIS — Z79899 Other long term (current) drug therapy: Secondary | ICD-10-CM | POA: Diagnosis not present

## 2018-03-23 DIAGNOSIS — E119 Type 2 diabetes mellitus without complications: Secondary | ICD-10-CM | POA: Diagnosis not present

## 2018-03-23 DIAGNOSIS — H35033 Hypertensive retinopathy, bilateral: Secondary | ICD-10-CM | POA: Diagnosis not present

## 2018-03-26 ENCOUNTER — Other Ambulatory Visit: Payer: Self-pay | Admitting: Family Medicine

## 2018-03-26 ENCOUNTER — Other Ambulatory Visit: Payer: Self-pay | Admitting: "Endocrinology

## 2018-03-29 ENCOUNTER — Telehealth: Payer: Self-pay | Admitting: "Endocrinology

## 2018-03-29 NOTE — Telephone Encounter (Signed)
Danielle Howard is asking for a refill on metFORMIN (GLUCOPHAGE) 500 MG tablet and levothyroxine (SYNTHROID, LEVOTHROID) 137 MCG tablet  Please advise?

## 2018-03-29 NOTE — Telephone Encounter (Signed)
rx sent

## 2018-04-07 ENCOUNTER — Encounter: Payer: Self-pay | Admitting: Family Medicine

## 2018-04-07 ENCOUNTER — Ambulatory Visit (INDEPENDENT_AMBULATORY_CARE_PROVIDER_SITE_OTHER): Payer: Medicare Other | Admitting: Family Medicine

## 2018-04-07 ENCOUNTER — Other Ambulatory Visit: Payer: Self-pay

## 2018-04-07 VITALS — BP 122/62 | HR 80 | Temp 98.4°F | Resp 14 | Ht 66.0 in | Wt 232.0 lb

## 2018-04-07 DIAGNOSIS — H2000 Unspecified acute and subacute iridocyclitis: Secondary | ICD-10-CM | POA: Insufficient documentation

## 2018-04-07 DIAGNOSIS — R609 Edema, unspecified: Secondary | ICD-10-CM | POA: Diagnosis not present

## 2018-04-07 DIAGNOSIS — M75101 Unspecified rotator cuff tear or rupture of right shoulder, not specified as traumatic: Secondary | ICD-10-CM

## 2018-04-07 DIAGNOSIS — M25511 Pain in right shoulder: Secondary | ICD-10-CM | POA: Diagnosis not present

## 2018-04-07 DIAGNOSIS — G8929 Other chronic pain: Secondary | ICD-10-CM

## 2018-04-07 DIAGNOSIS — I10 Essential (primary) hypertension: Secondary | ICD-10-CM

## 2018-04-07 DIAGNOSIS — Z6837 Body mass index (BMI) 37.0-37.9, adult: Secondary | ICD-10-CM

## 2018-04-07 DIAGNOSIS — E890 Postprocedural hypothyroidism: Secondary | ICD-10-CM | POA: Diagnosis not present

## 2018-04-07 DIAGNOSIS — Z0000 Encounter for general adult medical examination without abnormal findings: Secondary | ICD-10-CM

## 2018-04-07 DIAGNOSIS — M25512 Pain in left shoulder: Secondary | ICD-10-CM

## 2018-04-07 NOTE — Patient Instructions (Addendum)
Referral to Doree Fudge for shoulder injection We will call with results Look at the advanced directives F/U 4 months

## 2018-04-07 NOTE — Progress Notes (Signed)
Patient ID: Danielle Howard, female   DOB: 1950-07-08, 67 y.o.   MRN: [id number] Subjective:   Patient presents for Medicare Annual/Subsequent preventive examination.     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 Dorris Fetch, no changes in medication   Prediabetes- taking metofrmin, last A1C 5.7%  Peripheral edema- taking diuretic and wearing compression hose  HTN- taking BP medication as prescribed   Bilat Shoulder pain- has pain raning above her head,using tylenol  , pain for many months, no falls or specific injury, she does exercise but does not lift weights        Review Past Medical/Family/Social: Per EMR   Risk Factors  Current exercise habits:  Exercises most days Dietary issues discussed: Yes  Cardiac risk factors: Obesity (BMI >= 30 kg/m2). HTN, borderline DM  Depression Screen  (Note: if answer to either of the following is "Yes", a more complete depression screening is indicated)  Over the past two weeks, have you felt down, depressed or hopeless? No Over the past two weeks, have you felt little interest or pleasure in doing things? No Have you lost interest or pleasure in daily life? No Do you often feel hopeless? No Do you cry easily over simple problems? No   Activities of Daily Living  In your present state of health, do you have any difficulty performing the following activities?:  Driving? No  Managing money? No  Feeding yourself? No  Getting from bed to chair? No  Climbing a flight of stairs? No  Preparing food and eating?: No  Bathing or showering? No  Getting dressed: No  Getting to the toilet? No  Using the toilet:No  Moving around from place to place: No  In the past year have you fallen or had a  near fall?:No  Are you sexually active? No  Do you have more than one partner? No   Hearing Difficulties: No  Do you often ask people to speak up or repeat themselves? No  Do you experience ringing or noises in your ears? No Do you have difficulty understanding soft or whispered voices? No  Do you feel that you have a problem with memory? No Do you often misplace items? No  Do you feel safe at home? Yes  Cognitive Testing  Alert? Yes Normal Appearance?Yes  Oriented to person? Yes Place? Yes  Time? Yes  Recall of three objects? Yes  Can perform simple calculations? Yes  Displays appropriate judgment?Yes  Can read the correct time from a watch face?Yes   List the Names of Other Physician/Practitioners you currently use:   Opthalomology  Dr. Manuella Ghazi- [facility]    Screening Tests / Date Colonoscopy       UTD              Zostavax - UTD ,  Pneumonia- UTD Mammogram UTD Influenza Vaccine  UTD Tetanus/tdap UTD Bone Density- Normal 2016  ROS: GEN- denies fatigue, fever, weight loss,weakness, recent illness HEENT- denies eye drainage, change in vision, nasal discharge, CVS- denies chest pain, palpitations RESP- denies SOB, cough, wheeze ABD- denies N/V, change in stools, abd pain GU- denies dysuria, hematuria, dribbling, incontinence MSK- + joint pain, muscle aches, injury Neuro- denies headache, dizziness, syncope, seizure activity  PHYSICAL: Vitals  reviewed  GEN- NAD, alert and oriented x3 HEENT- PERRL, EOMI, non injected sclera, pink conjunctiva, MMM, oropharynx clear, TM CLEAR BILAT no effusion  Neck- Supple, no thryomegaly, no bruit  CVS- RRR, no murmur RESP-CTAB ABS-NABS,soft,NT,ND MSK- decreased ROM Right shoulder, upper ext compared to left, +empty can right side, biceps in tact, TTP ant shoulder bilat EXT- No edema, compresison hose  Pulses- Radial 2+  Assessment:    Annual wellness medicare exam   Plan:    During the course of the visit the patient was  educated and counseled about appropriate screening and preventive services including:  Functional screen/audit c/fall /depression screening NEGATIVE   Prevention UTD/ Immunizations  UTD   Discussed advanced directives- given handout   Rotator cuff syndrome/shoulder pain- refer to orthopedics, may benefit from steroid shot   Fasting labs done for cholesterol and renal function   HTN- well controlled   Hypothyroidism- per endocrinology   Edema- continue compression hose and diuretics    Diet review for nutrition referral? Yes ____ Not Indicated __x__  Patient Instructions (the written plan) was given to the patient.  Medicare Attestation  I have personally reviewed:  The patient's medical and social history  Their use of alcohol, tobacco or illicit drugs  Their current medications and supplements  The patient's functional ability including ADLs,fall risks, home safety risks, cognitive, and hearing and visual impairment  Diet and physical activities  Evidence for depression or mood disorders  The patient's weight, height, BMI, and visual acuity have been recorded in the chart. I have made referrals, counseling, and provided education to the patient based on review of the above and I have provided the patient with a written personalized care plan for preventive services.

## 2018-04-08 LAB — COMPREHENSIVE METABOLIC PANEL
AG Ratio: 1.4 (calc) (ref 1.0–2.5)
ALT: 12 U/L (ref 6–29)
AST: 21 U/L (ref 10–35)
Albumin: 4.1 g/dL (ref 3.6–5.1)
Alkaline phosphatase (APISO): 95 U/L (ref 33–130)
BUN: 13 mg/dL (ref 7–25)
CO2: 30 mmol/L (ref 20–32)
Calcium: 9.9 mg/dL (ref 8.6–10.4)
Chloride: 104 mmol/L (ref 98–110)
Creat: 0.89 mg/dL (ref 0.50–0.99)
Globulin: 2.9 g/dL (calc) (ref 1.9–3.7)
Glucose, Bld: 78 mg/dL (ref 65–99)
Potassium: 4.4 mmol/L (ref 3.5–5.3)
Sodium: 143 mmol/L (ref 135–146)
Total Bilirubin: 0.7 mg/dL (ref 0.2–1.2)
Total Protein: 7 g/dL (ref 6.1–8.1)

## 2018-04-08 LAB — CBC WITH DIFFERENTIAL/PLATELET
Basophils Absolute: 47 cells/uL (ref 0–200)
Basophils Relative: 0.7 %
Eosinophils Absolute: 281 cells/uL (ref 15–500)
Eosinophils Relative: 4.2 %
HCT: 38.7 % (ref 35.0–45.0)
Hemoglobin: 12.7 g/dL (ref 11.7–15.5)
Lymphs Abs: 2271 cells/uL (ref 850–3900)
MCH: 29.6 pg (ref 27.0–33.0)
MCHC: 32.8 g/dL (ref 32.0–36.0)
MCV: 90.2 fL (ref 80.0–100.0)
MPV: 9.9 fL (ref 7.5–12.5)
Monocytes Relative: 8.6 %
Neutro Abs: 3524 cells/uL (ref 1500–7800)
Neutrophils Relative %: 52.6 %
Platelets: 407 10*3/uL — ABNORMAL HIGH (ref 140–400)
RBC: 4.29 10*6/uL (ref 3.80–5.10)
RDW: 14.1 % (ref 11.0–15.0)
Total Lymphocyte: 33.9 %
WBC mixed population: 576 cells/uL (ref 200–950)
WBC: 6.7 10*3/uL (ref 3.8–10.8)

## 2018-04-08 LAB — LIPID PANEL
Cholesterol: 195 mg/dL (ref <200)
HDL: 65 mg/dL (ref >50)
LDL Cholesterol (Calc): 107 mg/dL (calc) — ABNORMAL HIGH
Non-HDL Cholesterol (Calc): 130 mg/dL (calc) — ABNORMAL HIGH (ref <130)
Total CHOL/HDL Ratio: 3 (calc) (ref <5.0)
Triglycerides: 121 mg/dL (ref <150)

## 2018-04-09 ENCOUNTER — Encounter: Payer: Self-pay | Admitting: *Deleted

## 2018-04-19 DIAGNOSIS — M25512 Pain in left shoulder: Secondary | ICD-10-CM | POA: Diagnosis not present

## 2018-04-19 DIAGNOSIS — M25511 Pain in right shoulder: Secondary | ICD-10-CM | POA: Diagnosis not present

## 2018-05-04 ENCOUNTER — Encounter (INDEPENDENT_AMBULATORY_CARE_PROVIDER_SITE_OTHER): Payer: Self-pay | Admitting: *Deleted

## 2018-05-17 DIAGNOSIS — M25512 Pain in left shoulder: Secondary | ICD-10-CM | POA: Diagnosis not present

## 2018-05-17 DIAGNOSIS — M25511 Pain in right shoulder: Secondary | ICD-10-CM | POA: Diagnosis not present

## 2018-05-19 ENCOUNTER — Telehealth (INDEPENDENT_AMBULATORY_CARE_PROVIDER_SITE_OTHER): Payer: Self-pay | Admitting: *Deleted

## 2018-05-19 ENCOUNTER — Encounter (INDEPENDENT_AMBULATORY_CARE_PROVIDER_SITE_OTHER): Payer: Self-pay | Admitting: *Deleted

## 2018-05-19 MED ORDER — PEG 3350-KCL-NA BICARB-NACL 420 G PO SOLR: 4000.0000 mL | Freq: Once | ORAL | 0 refills | Status: AC

## 2018-05-19 NOTE — Telephone Encounter (Signed)
Patient needs trilyte 

## 2018-05-20 ENCOUNTER — Other Ambulatory Visit (INDEPENDENT_AMBULATORY_CARE_PROVIDER_SITE_OTHER): Payer: Self-pay | Admitting: *Deleted

## 2018-05-20 DIAGNOSIS — Z86010 Personal history of colonic polyps: Secondary | ICD-10-CM | POA: Insufficient documentation

## 2018-05-21 ENCOUNTER — Telehealth (INDEPENDENT_AMBULATORY_CARE_PROVIDER_SITE_OTHER): Payer: Self-pay | Admitting: *Deleted

## 2018-05-21 NOTE — Telephone Encounter (Signed)
Referring MD/PCP: [place]   Procedure: tcs  Reason/Indication:  Hx polyps  Has patient had this procedure before?  Yes, 2015  If so, when, by whom and where?    Is there a family history of colon cancer?  no  Who?  What age when diagnosed?    Is patient diabetic?   yes      Does patient have prosthetic heart valve or mechanical valve?  no  Do you have a pacemaker?  no  Has patient ever had endocarditis? no  Has patient had joint replacement within last 12 months?  no  Is patient constipated or do they take laxatives? no  Does patient have a history of alcohol/drug use?  no  Is patient on blood thinner such as Coumadin, Plavix and/or Aspirin? no  Medications: brimonide 0.2%, calcium bid, vit d daily, folic acid daily, levothyroxine 137 mcg daily, metformin 500 mg daily, methotrexate 2.5 mg 6 tabs once a week, mvi daily, potassium 20 meq daily, timolol 0.5% bid, torsemide 20 mg daily, verapamil 240 mg bid  Allergies: dicloxacillin  Medication Adjustment per Dr Lindi Adie, NP: hold metformin morning of procedure  Procedure date & time: 06/21/18 at 1020

## 2018-05-24 ENCOUNTER — Ambulatory Visit (INDEPENDENT_AMBULATORY_CARE_PROVIDER_SITE_OTHER): Payer: Medicare Other | Admitting: Otolaryngology

## 2018-05-24 DIAGNOSIS — H903 Sensorineural hearing loss, bilateral: Secondary | ICD-10-CM

## 2018-05-25 DIAGNOSIS — H2511 Age-related nuclear cataract, right eye: Secondary | ICD-10-CM | POA: Diagnosis not present

## 2018-05-25 DIAGNOSIS — H35033 Hypertensive retinopathy, bilateral: Secondary | ICD-10-CM | POA: Diagnosis not present

## 2018-05-25 DIAGNOSIS — E119 Type 2 diabetes mellitus without complications: Secondary | ICD-10-CM | POA: Diagnosis not present

## 2018-05-25 DIAGNOSIS — H3581 Retinal edema: Secondary | ICD-10-CM | POA: Diagnosis not present

## 2018-05-25 DIAGNOSIS — Z961 Presence of intraocular lens: Secondary | ICD-10-CM | POA: Diagnosis not present

## 2018-05-25 DIAGNOSIS — H35372 Puckering of macula, left eye: Secondary | ICD-10-CM | POA: Diagnosis not present

## 2018-05-25 DIAGNOSIS — Z79899 Other long term (current) drug therapy: Secondary | ICD-10-CM | POA: Diagnosis not present

## 2018-05-25 DIAGNOSIS — H20042 Secondary noninfectious iridocyclitis, left eye: Secondary | ICD-10-CM | POA: Diagnosis not present

## 2018-05-25 NOTE — Telephone Encounter (Signed)
agree

## 2018-06-21 ENCOUNTER — Ambulatory Visit (HOSPITAL_COMMUNITY)
Admission: RE | Admit: 2018-06-21 | Discharge: 2018-06-21 | Disposition: A | Discharge status: Home / Self Care | Payer: Medicare Other | Attending: Internal Medicine | Admitting: Internal Medicine

## 2018-06-21 ENCOUNTER — Encounter (HOSPITAL_COMMUNITY): Payer: Self-pay | Admitting: *Deleted

## 2018-06-21 ENCOUNTER — Encounter (HOSPITAL_COMMUNITY)
Admission: RE | Disposition: A | Discharge status: Home / Self Care | Payer: Self-pay | Source: Home / Self Care | Attending: Internal Medicine

## 2018-06-21 ENCOUNTER — Other Ambulatory Visit: Payer: Self-pay

## 2018-06-21 DIAGNOSIS — Z9049 Acquired absence of other specified parts of digestive tract: Secondary | ICD-10-CM | POA: Diagnosis not present

## 2018-06-21 DIAGNOSIS — Z90710 Acquired absence of both cervix and uterus: Secondary | ICD-10-CM | POA: Diagnosis not present

## 2018-06-21 DIAGNOSIS — Z8489 Family history of other specified conditions: Secondary | ICD-10-CM | POA: Insufficient documentation

## 2018-06-21 DIAGNOSIS — I10 Essential (primary) hypertension: Secondary | ICD-10-CM | POA: Insufficient documentation

## 2018-06-21 DIAGNOSIS — Z1211 Encounter for screening for malignant neoplasm of colon: Secondary | ICD-10-CM | POA: Diagnosis not present

## 2018-06-21 DIAGNOSIS — Z8249 Family history of ischemic heart disease and other diseases of the circulatory system: Secondary | ICD-10-CM | POA: Insufficient documentation

## 2018-06-21 DIAGNOSIS — K6289 Other specified diseases of anus and rectum: Secondary | ICD-10-CM

## 2018-06-21 DIAGNOSIS — D123 Benign neoplasm of transverse colon: Secondary | ICD-10-CM | POA: Insufficient documentation

## 2018-06-21 DIAGNOSIS — Z79899 Other long term (current) drug therapy: Secondary | ICD-10-CM | POA: Diagnosis not present

## 2018-06-21 DIAGNOSIS — E079 Disorder of thyroid, unspecified: Secondary | ICD-10-CM | POA: Insufficient documentation

## 2018-06-21 DIAGNOSIS — K644 Residual hemorrhoidal skin tags: Secondary | ICD-10-CM | POA: Diagnosis not present

## 2018-06-21 DIAGNOSIS — Z96653 Presence of artificial knee joint, bilateral: Secondary | ICD-10-CM | POA: Insufficient documentation

## 2018-06-21 DIAGNOSIS — Z7984 Long term (current) use of oral hypoglycemic drugs: Secondary | ICD-10-CM | POA: Insufficient documentation

## 2018-06-21 DIAGNOSIS — E039 Hypothyroidism, unspecified: Secondary | ICD-10-CM | POA: Diagnosis not present

## 2018-06-21 DIAGNOSIS — Z880 Allergy status to penicillin: Secondary | ICD-10-CM | POA: Diagnosis not present

## 2018-06-21 DIAGNOSIS — E119 Type 2 diabetes mellitus without complications: Secondary | ICD-10-CM | POA: Insufficient documentation

## 2018-06-21 DIAGNOSIS — M1990 Unspecified osteoarthritis, unspecified site: Secondary | ICD-10-CM | POA: Diagnosis not present

## 2018-06-21 DIAGNOSIS — Z09 Encounter for follow-up examination after completed treatment for conditions other than malignant neoplasm: Secondary | ICD-10-CM | POA: Diagnosis not present

## 2018-06-21 DIAGNOSIS — Z860100 Personal history of colon polyps, unspecified: Secondary | ICD-10-CM | POA: Insufficient documentation

## 2018-06-21 DIAGNOSIS — Z8261 Family history of arthritis: Secondary | ICD-10-CM | POA: Insufficient documentation

## 2018-06-21 HISTORY — DX: Type 2 diabetes mellitus without complications: E11.9

## 2018-06-21 HISTORY — DX: Adverse effect of unspecified anesthetic, initial encounter: T41.45XA

## 2018-06-21 HISTORY — DX: Other complications of anesthesia, initial encounter: T88.59XA

## 2018-06-21 HISTORY — PX: POLYPECTOMY: SHX5525

## 2018-06-21 HISTORY — DX: Headache: R51

## 2018-06-21 HISTORY — DX: Headache, unspecified: R51.9

## 2018-06-21 HISTORY — PX: COLONOSCOPY: SHX5424

## 2018-06-21 LAB — GLUCOSE, CAPILLARY
Glucose-Capillary: 69 mg/dL — ABNORMAL LOW (ref 70–99)
Glucose-Capillary: 74 mg/dL (ref 70–99)

## 2018-06-21 SURGERY — COLONOSCOPY
Anesthesia: Moderate Sedation

## 2018-06-21 MED ORDER — SODIUM CHLORIDE 0.9 % IV SOLN
INTRAVENOUS | Status: DC
  Administered 2018-06-21: 1000 mL via INTRAVENOUS

## 2018-06-21 MED ORDER — MIDAZOLAM HCL 5 MG/5ML IJ SOLN
INTRAMUSCULAR | Status: DC | PRN
  Administered 2018-06-21 (×2): 2 mg via INTRAVENOUS
  Administered 2018-06-21: 1 mg via INTRAVENOUS

## 2018-06-21 MED ORDER — MEPERIDINE HCL 50 MG/ML IJ SOLN
INTRAMUSCULAR | Status: DC | PRN
  Administered 2018-06-21 (×2): 25 mg

## 2018-06-21 MED ORDER — MEPERIDINE HCL 50 MG/ML IJ SOLN
INTRAMUSCULAR | Status: AC
  Filled 2018-06-21: qty 1

## 2018-06-21 MED ORDER — STERILE WATER FOR IRRIGATION IR SOLN
Status: DC | PRN
  Administered 2018-06-21: 1.5 mL

## 2018-06-21 MED ORDER — MIDAZOLAM HCL 5 MG/5ML IJ SOLN
INTRAMUSCULAR | Status: AC
  Filled 2018-06-21: qty 10

## 2018-06-21 NOTE — Discharge Instructions (Signed)
No aspirin or NSAIDs for 1 week. Resume usual medications and diet as before. No driving for 24 hours. Physician will call with biopsy results.    Colon Polyps  Polyps are tissue growths inside the body. Polyps can grow in many places, including the large intestine (colon). A polyp may be a round bump or a mushroom-shaped growth. You could have one polyp or several. Most colon polyps are noncancerous (benign). However, some colon polyps can become cancerous over time. Finding and removing the polyps early can help prevent this. What are the causes? The exact cause of colon polyps is not known. What increases the risk? You are more likely to develop this condition if you:  Have a family history of colon cancer or colon polyps.  Are older than 37 or older than 45 if you are African American.  Have inflammatory bowel disease, such as ulcerative colitis or Crohn's disease.  Have certain hereditary conditions, such as: ? Familial adenomatous polyposis. ? Lynch syndrome. ? Turcot syndrome. ? Peutz-Jeghers syndrome.  Are overweight.  Smoke cigarettes.  Do not get enough exercise.  Drink too much alcohol.  Eat a diet that is high in fat and red meat and low in fiber.  Had childhood cancer that was treated with abdominal radiation. What are the signs or symptoms? Most polyps do not cause symptoms. If you have symptoms, they may include:  Blood coming from your rectum when having a bowel movement.  Blood in your stool. The stool may look dark red or black.  Abdominal pain.  A change in bowel habits, such as constipation or diarrhea. How is this diagnosed? This condition is diagnosed with a colonoscopy. This is a procedure in which a lighted, flexible scope is inserted into the anus and then passed into the colon to examine the area. Polyps are sometimes found when a colonoscopy is done as part of routine cancer screening tests. How is this treated? Treatment for this  condition involves removing any polyps that are found. Most polyps can be removed during a colonoscopy. Those polyps will then be tested for cancer. Additional treatment may be needed depending on the results of testing. Follow these instructions at home: Lifestyle  Maintain a healthy weight, or lose weight if recommended by your health care provider.  Exercise every day or as told by your health care provider.  Do not use any products that contain nicotine or tobacco, such as cigarettes and e-cigarettes. If you need help quitting, ask your health care provider.  If you drink alcohol, limit how much you have: ? 0-1 drink a day for women. ? 0-2 drinks a day for men.  Be aware of how much alcohol is in your drink. In the [place] one drink equals one 12 oz bottle of beer (355 mL), one 5 oz glass of wine (148 mL), or one 1 oz shot of hard liquor (44 mL). Eating and drinking   Eat foods that are high in fiber, such as fruits, vegetables, and whole grains.  Eat foods that are high in calcium and vitamin D, such as milk, cheese, yogurt, eggs, liver, fish, and broccoli.  Limit foods that are high in fat, such as fried foods and desserts.  Limit the amount of red meat and processed meat you eat, such as hot dogs, sausage, bacon, and lunch meats. General instructions  Keep all follow-up visits as told by your health care provider. This is important. ? This includes having regularly scheduled colonoscopies. ? Talk to your  health care provider about when you need a colonoscopy. Contact a health care provider if:  You have new or worsening bleeding during a bowel movement.  You have new or increased blood in your stool.  You have a change in bowel habits.  You lose weight for no known reason. Summary  Polyps are tissue growths inside the body. Polyps can grow in many places, including the colon.  Most colon polyps are noncancerous (benign), but some can become cancerous over  time.  This condition is diagnosed with a colonoscopy.  Treatment for this condition involves removing any polyps that are found. Most polyps can be removed during a colonoscopy. This information is not intended to replace advice given to you by your health care provider. Make sure you discuss any questions you have with your health care provider. Document Released: 01/16/2004 Document Revised: 08/06/2017 Document Reviewed: 08/06/2017 Elsevier Interactive Patient Education  2019 [employer].  Colonoscopy, Adult, Care After This sheet gives you information about how to care for yourself after your procedure. Your doctor may also give you more specific instructions. If you have problems or questions, call your doctor. What can I expect after the procedure? After the procedure, it is common to have:  A small amount of blood in your poop for 24 hours.  Some gas.  Mild cramping or bloating in your belly. Follow these instructions at home: General instructions  For the first 24 hours after the procedure: ? Do not drive or use machinery. ? Do not sign important documents. ? Do not drink alcohol. ? Do your daily activities more slowly than normal. ? Eat foods that are soft and easy to digest.  Take over-the-counter or prescription medicines only as told by your doctor. To help cramping and bloating:   Try walking around.  Put heat on your belly (abdomen) as told by your doctor. Use a heat source that your doctor recommends, such as a moist heat pack or a heating pad. ? Put a towel between your skin and the heat source. ? Leave the heat on for 20-30 minutes. ? Remove the heat if your skin turns bright red. This is especially important if you cannot feel pain, heat, or cold. You can get burned. Eating and drinking   Drink enough fluid to keep your pee (urine) clear or pale yellow.  Return to your normal diet as told by your doctor. Avoid heavy or fried foods that are hard to  digest.  Avoid drinking alcohol for as long as told by your doctor. Contact a doctor if:  You have blood in your poop (stool) 2-3 days after the procedure. Get help right away if:  You have more than a small amount of blood in your poop.  You see large clumps of tissue (blood clots) in your poop.  Your belly is swollen.  You feel sick to your stomach (nauseous).  You throw up (vomit).  You have a fever.  You have belly pain that gets worse, and medicine does not help your pain. Summary  After the procedure, it is common to have a small amount of blood in your poop. You may also have mild cramping and bloating in your belly.  For the first 24 hours after the procedure, do not drive or use machinery, do not sign important documents, and do not drink alcohol.  Get help right away if you have a lot of blood in your poop, feel sick to your stomach, have a fever, or have more  belly pain. This information is not intended to replace advice given to you by your health care provider. Make sure you discuss any questions you have with your health care provider. Document Released: 05/24/2010 Document Revised: 02/19/2017 Document Reviewed: 01/14/2016 Elsevier Interactive Patient Education  2019 [employer]  Hemorrhoids Hemorrhoids are swollen veins that may develop:  In the butt (rectum). These are called internal hemorrhoids.  Around the opening of the butt (anus). These are called external hemorrhoids. Hemorrhoids can cause pain, itching, or bleeding. Most of the time, they do not cause serious problems. They usually get better with diet changes, lifestyle changes, and other home treatments. What are the causes? This condition may be caused by:  Having trouble pooping (constipation).  Pushing hard (straining) to poop.  Watery poop (diarrhea).  Pregnancy.  Being very overweight (obese).  Sitting for long periods of time.  Heavy lifting or other activity that causes you to  strain.  Anal sex.  Riding a bike for a long period of time. What are the signs or symptoms? Symptoms of this condition include:  Pain.  Itching or soreness in the butt.  Bleeding from the butt.  Leaking poop.  Swelling in the area.  One or more lumps around the opening of your butt. How is this diagnosed? A doctor can often diagnose this condition by looking at the affected area. The doctor may also:  Do an exam that involves feeling the area with a gloved hand (digital rectal exam).  Examine the area inside your butt using a small tube (anoscope).  Order blood tests. This may be done if you have lost a lot of blood.  Have you get a test that involves looking inside the colon using a flexible tube with a camera on the end (sigmoidoscopy or colonoscopy). How is this treated? This condition can usually be treated at home. Your doctor may tell you to change what you eat, make lifestyle changes, or try home treatments. If these do not help, procedures can be done to remove the hemorrhoids or make them smaller. These may involve:  Placing rubber bands at the base of the hemorrhoids to cut off their blood supply.  Injecting medicine into the hemorrhoids to shrink them.  Shining a type of light energy onto the hemorrhoids to cause them to fall off.  Doing surgery to remove the hemorrhoids or cut off their blood supply. Follow these instructions at home: Eating and drinking   Eat foods that have a lot of fiber in them. These include whole grains, beans, nuts, fruits, and vegetables.  Ask your doctor about taking products that have added fiber (fibersupplements).  Reduce the amount of fat in your diet. You can do this by: ? Eating low-fat dairy products. ? Eating less red meat. ? Avoiding processed foods.  Drink enough fluid to keep your pee (urine) pale yellow. Managing pain and swelling   Take a warm-water bath (sitz bath) for 20 minutes to ease pain. Do this 3-4  times a day. You may do this in a bathtub or using a portable sitz bath that fits over the toilet.  If told, put ice on the painful area. It may be helpful to use ice between your warm baths. ? Put ice in a plastic bag. ? Place a towel between your skin and the bag. ? Leave the ice on for 20 minutes, 2-3 times a day. General instructions  Take over-the-counter and prescription medicines only as told by your doctor. ? Medicated creams  and medicines may be used as told.  Exercise often. Ask your doctor how much and what kind of exercise is best for you.  Go to the bathroom when you have the urge to poop. Do not wait.  Avoid pushing too hard when you poop.  Keep your butt dry and clean. Use wet toilet paper or moist towelettes after pooping.  Do not sit on the toilet for a long time.  Keep all follow-up visits as told by your doctor. This is important. Contact a doctor if you:  Have pain and swelling that do not get better with treatment or medicine.  Have trouble pooping.  Cannot poop.  Have pain or swelling outside the area of the hemorrhoids. Get help right away if you have:  Bleeding that will not stop. Summary  Hemorrhoids are swollen veins in the butt or around the opening of the butt.  They can cause pain, itching, or bleeding.  Eat foods that have a lot of fiber in them. These include whole grains, beans, nuts, fruits, and vegetables.  Take a warm-water bath (sitz bath) for 20 minutes to ease pain. Do this 3-4 times a day. This information is not intended to replace advice given to you by your health care provider. Make sure you discuss any questions you have with your health care provider. Document Released: 01/29/2008 Document Revised: 09/10/2017 Document Reviewed: 09/10/2017 Elsevier Interactive Patient Education  2019 [employer].

## 2018-06-21 NOTE — Op Note (Signed)
[facility] Patient Name: Danielle Howard Procedure Date: 06/21/2018 9:27 AM MRN: [id number] Date of Birth: 1951-02-11 Attending MD: Hildred Laser , MD CSN: [id number] Age: 68 Admit Type: Outpatient Procedure:                Colonoscopy Indications:              High risk colon cancer surveillance: Personal                            history of colonic polyps Providers:                Hildred Laser, MD, Janeece Riggers, RN, Aram Candela Referring MD:             Modena Nunnery. Weston, MD Medicines:                Meperidine 50 mg IV, Midazolam 6 mg IV Complications:            No immediate complications. Estimated Blood Loss:     Estimated blood loss was minimal. Procedure:                Pre-Anesthesia Assessment:                           - Prior to the procedure, a History and Physical                            was performed, and patient medications and                            allergies were reviewed. The patient's tolerance of                            previous anesthesia was also reviewed. The risks                            and benefits of the procedure and the sedation                            options and risks were discussed with the patient.                            All questions were answered, and informed consent                            was obtained. Prior Anticoagulants: The patient has                            taken no previous anticoagulant or antiplatelet                            agents. ASA Grade Assessment: III - A patient with                            severe systemic disease. After reviewing the risks  and benefits, the patient was deemed in                            satisfactory condition to undergo the procedure.                           After obtaining informed consent, the colonoscope                            was passed under direct vision. Throughout the                            procedure, the patient's blood pressure,  pulse, and                            oxygen saturations were monitored continuously. The                            PCF-[id number] ([id number]) was introduced through the                            anus and advanced to the the cecum, identified by                            appendiceal orifice and ileocecal valve. The                            colonoscopy was performed without difficulty. The                            patient tolerated the procedure well. The quality                            of the bowel preparation was good. The ileocecal                            valve, appendiceal orifice, and rectum were                            photographed. Scope In: 10:06:44 AM Scope Out: 10:32:49 AM Scope Withdrawal Time: 0 hours 20 minutes 9 seconds  Total Procedure Duration: 0 hours 26 minutes 5 seconds  Findings:      Skin tags were found on perianal exam.      The digital rectal exam was normal.      A 12 mm polyp was found in the hepatic flexure. The polyp was       pedunculated. The polyp was removed with a hot snare. Resection and       retrieval were complete. The pathology specimen was placed into Bottle       Number 1.      Three sessile polyps were found in the transverse colon. The polyps were       small in size. These polyps were removed with a cold snare. Resection       and retrieval were complete. The pathology specimen was placed  into       Bottle Number 2.      External hemorrhoids were found during retroflexion. The hemorrhoids       were small.      Anal papilla(e) were hypertrophied. Impression:               - Perianal skin tags found on perianal exam.                           - One 12 mm polyp at the hepatic flexure, removed                            with a hot snare. Resected and retrieved.                           - Three small polyps in the transverse colon,                            removed with a cold snare. Resected and retrieved.                           -  External hemorrhoids.                           - Anal papilla(e) were hypertrophied. Moderate Sedation:      Moderate (conscious) sedation was administered by the endoscopy nurse       and supervised by the endoscopist. The following parameters were       monitored: oxygen saturation, heart rate, blood pressure, CO2       capnography and response to care. Total physician intraservice time was       32 minutes. Recommendation:           - Patient has a contact number available for                            emergencies. The signs and symptoms of potential                            delayed complications were discussed with the                            patient. Return to normal activities tomorrow.                            Written discharge instructions were provided to the                            patient.                           - Resume previous diet today.                           - Continue present medications.                           -  No aspirin, ibuprofen, naproxen, or other                            non-steroidal anti-inflammatory drugs for 7 days                            after polyp removal.                           - Await pathology results.                           - Repeat colonoscopy is recommended. The                            colonoscopy date will be determined after pathology                            results from today's exam become available for                            review. Procedure Code(s):        --- Professional ---                           [phone], Colonoscopy, flexible; with removal of                            tumor(s), polyp(s), or other lesion(s) by snare                            technique                           99153, Moderate sedation; each additional 15                            minutes intraservice time                           G0500, Moderate sedation services provided by the                            same physician or  other qualified health care                            professional performing a gastrointestinal                            endoscopic service that sedation supports,                            requiring the presence of an independent trained                            observer to assist in the monitoring of the  patient's level of consciousness and physiological                            status; initial 15 minutes of intra-service time;                            patient age 63 years or older (additional time may                            be reported with [id number], as appropriate) Diagnosis Code(s):        --- Professional ---                           D12.3, Benign neoplasm of transverse colon (hepatic                            flexure or splenic flexure)                           K64.4, Residual hemorrhoidal skin tags                           Z86.010, Personal history of colonic polyps                           K62.89, Other specified diseases of anus and rectum CPT copyright 2018 American Medical Association. All rights reserved. The codes documented in this report are preliminary and upon coder review may  be revised to meet current compliance requirements. Hildred Laser, MD Hildred Laser, MD 06/21/2018 10:41:55 AM This report has been signed electronically. Number of Addenda: 0

## 2018-06-21 NOTE — H&P (Signed)
Danielle Howard is an 68 y.o. female.   Chief Complaint: Patient is here for colonoscopy. HPI: Patient is 68 year old African-American female with history of colonic polyps and is here for surveillance colonoscopy.  She last colonoscopy was in January 2015 revealing 4 polyps and 3 were tubular adenomas.  She denies abdominal pain change in bowel habits or rectal bleeding. Family history is negative for CRC.  Past Medical History:  Diagnosis Date  . Arthritis   . Complication of anesthesia    nausea  . Diabetes mellitus without complication ([place])   . Headache   . Hypertension   . Hypothyroidism   . Hypothyroidism   . Iritis   . Thyroid disease     Past Surgical History:  Procedure Laterality Date  . ABDOMINAL HYSTERECTOMY    . CATARACT EXTRACTION Left 2019  . CHOLECYSTECTOMY     02/2012  . COLONOSCOPY N/A 05/25/2013   Procedure: COLONOSCOPY;  Surgeon: Rogene Houston, MD;  Location: [facility];  Service: Endoscopy;  Laterality: N/A;  830-moved to 1055 Ann to notify pt  . JOINT REPLACEMENT Bilateral    2012,2013-bilateral knees  . THYROID SURGERY      Family History  Problem Relation Age of Onset  . Arthritis Mother   . Heart disease Mother   . Hypertension Mother   . Arthritis Father   . Hyperlipidemia Father   . HIV/AIDS Brother    Social History:  reports that she has never smoked. She has never used smokeless tobacco. She reports current alcohol use. She reports that she does not use drugs.  Allergies:  Allergies  Allergen Reactions  . Dicloxacillin Other (See Comments)    GI upset    Medications Prior to Admission  Medication Sig Dispense Refill  . brimonidine (ALPHAGAN) 0.2 % ophthalmic solution Place 1 drop into the left eye 3 (three) times daily.   5  . Calcium Carb-Cholecalciferol (CALCIUM 600/VITAMIN D3 PO) Take 1 tablet by mouth daily.    . cholecalciferol (VITAMIN D) 1000 UNITS tablet Take 1,000 Units by mouth daily.    . cycloSPORINE modified (NEORAL)  25 MG capsule Take 25 mg by mouth 2 (two) times daily.    . folic acid (FOLVITE) 1 MG tablet Take 1 mg by mouth daily.     Marland Kitchen levothyroxine (SYNTHROID, LEVOTHROID) 137 MCG tablet TAKE 1 TABLET(137 MCG) BY MOUTH DAILY (Patient taking differently: Take 137 mcg by mouth daily before breakfast. ) 90 tablet 0  . metFORMIN (GLUCOPHAGE) 500 MG tablet TAKE 1 TABLET(500 MG) BY MOUTH DAILY AFTER BREAKFAST (Patient taking differently: Take 500 mg by mouth daily after breakfast. ) 90 tablet 0  . methotrexate (RHEUMATREX) 2.5 MG tablet Take 15 mg by mouth every Wednesday.     . Multiple Vitamins-Minerals (MULTIVITAMIN WITH MINERALS) tablet Take 1 tablet by mouth daily.    . potassium chloride SA (K-DUR,KLOR-CON) 20 MEQ tablet TAKE 1 TABLET BY MOUTH TWICE DAILY (Patient taking differently: Take 20 mEq by mouth daily. ) 180 tablet 0  . timolol (TIMOPTIC) 0.5 % ophthalmic solution Place 1 drop into both eyes 2 (two) times daily.   5  . torsemide (DEMADEX) 20 MG tablet TAKE 1 TABLET BY MOUTH DAILY (Patient taking differently: Take 20 mg by mouth daily. ) 90 tablet 0  . verapamil (CALAN-SR) 240 MG CR tablet TAKE 1 TABLET(240 MG) BY MOUTH TWICE DAILY (Patient taking differently: Take 240 mg by mouth 2 (two) times daily. ) 180 tablet 0    Results for  orders placed or performed during the hospital encounter of 06/21/18 (from the past 48 hour(s))  Glucose, capillary     Status: None   Collection Time: 06/21/18  9:37 AM  Result Value Ref Range   Glucose-Capillary 74 70 - 99 mg/dL   No results found.  ROS  Blood pressure 137/77, pulse 79, temperature (!) 97.5 F (36.4 C), temperature source Oral, resp. rate 13, height 5\' 6"  (1.676 m), weight 106.6 kg, SpO2 100 %. Physical Exam  Constitutional: She appears well-developed and well-nourished.  HENT:  Mouth/Throat: Oropharynx is clear and moist.  Eyes: Conjunctivae are normal. No scleral icterus.  Neck: No thyromegaly present.  Cardiovascular: Normal rate, regular  rhythm and normal heart sounds.  No murmur heard. Respiratory: Effort normal and breath sounds normal.  GI:  Abdomen is full.  She has Pfannenstiel scar.  Abdomen is soft and nontender with organomegaly or masses.  Musculoskeletal:        General: Edema present.     Comments: Trace edema around ankles.  Lymphadenopathy:    She has no cervical adenopathy.  Neurological: She is alert.  Skin: Skin is warm and dry.     Assessment/Plan History of colonic adenomas. Surveillance colonoscopy.  Hildred Laser, MD 06/21/2018, 9:54 AM

## 2018-06-23 ENCOUNTER — Encounter (HOSPITAL_COMMUNITY): Payer: Self-pay | Admitting: Internal Medicine

## 2018-06-24 ENCOUNTER — Other Ambulatory Visit: Payer: Self-pay | Admitting: Family Medicine

## 2018-06-24 ENCOUNTER — Other Ambulatory Visit: Payer: Self-pay | Admitting: "Endocrinology

## 2018-06-28 DIAGNOSIS — M25512 Pain in left shoulder: Secondary | ICD-10-CM | POA: Diagnosis not present

## 2018-06-28 DIAGNOSIS — Z6837 Body mass index (BMI) 37.0-37.9, adult: Secondary | ICD-10-CM | POA: Diagnosis not present

## 2018-06-28 DIAGNOSIS — M25511 Pain in right shoulder: Secondary | ICD-10-CM | POA: Diagnosis not present

## 2018-07-17 ENCOUNTER — Ambulatory Visit (INDEPENDENT_AMBULATORY_CARE_PROVIDER_SITE_OTHER): Payer: Medicare Other | Admitting: Podiatry

## 2018-07-17 ENCOUNTER — Other Ambulatory Visit: Payer: Self-pay

## 2018-07-17 DIAGNOSIS — M722 Plantar fascial fibromatosis: Secondary | ICD-10-CM | POA: Diagnosis not present

## 2018-07-17 NOTE — Progress Notes (Signed)
Subjective:  Patient ID: Danielle Howard, female    DOB: 09-Oct-1950,  MRN: [id number]  No chief complaint on file.   68 y.o. female presents with the above complaint. Reports pain in both heels for the past week. R>L. Worst in AM and after walking. Sore and tender.   Review of Systems: Negative except as noted in the HPI. Denies N/V/F/Ch.  Past Medical History:  Diagnosis Date  . Arthritis   . Complication of anesthesia    nausea  . Diabetes mellitus without complication ([place])   . Headache   . Hypertension   . Hypothyroidism   . Hypothyroidism   . Iritis   . Thyroid disease     Current Outpatient Medications:  .  brimonidine (ALPHAGAN) 0.2 % ophthalmic solution, Place 1 drop into the left eye 3 (three) times daily. , Disp: , Rfl: 5 .  Calcium Carb-Cholecalciferol (CALCIUM 600/VITAMIN D3 PO), Take 1 tablet by mouth daily., Disp: , Rfl:  .  cholecalciferol (VITAMIN D) 1000 UNITS tablet, Take 1,000 Units by mouth daily., Disp: , Rfl:  .  cycloSPORINE modified (NEORAL) 25 MG capsule, Take 25 mg by mouth 2 (two) times daily., Disp: , Rfl:  .  folic acid (FOLVITE) 1 MG tablet, Take 1 mg by mouth daily. , Disp: , Rfl:  .  levothyroxine (SYNTHROID, LEVOTHROID) 137 MCG tablet, TAKE 1 TABLET(137 MCG) BY MOUTH DAILY, Disp: 90 tablet, Rfl: 0 .  metFORMIN (GLUCOPHAGE) 500 MG tablet, TAKE 1 TABLET(500 MG) BY MOUTH DAILY AFTER BREAKFAST, Disp: 90 tablet, Rfl: 0 .  methotrexate (RHEUMATREX) 2.5 MG tablet, Take 15 mg by mouth every Wednesday. , Disp: , Rfl:  .  Multiple Vitamins-Minerals (MULTIVITAMIN WITH MINERALS) tablet, Take 1 tablet by mouth daily., Disp: , Rfl:  .  polyethylene glycol-electrolytes (NULYTELY/GOLYTELY) 420 g solution, , Disp: , Rfl:  .  potassium chloride SA (K-DUR,KLOR-CON) 20 MEQ tablet, TAKE 1 TABLET BY MOUTH TWICE DAILY (Patient taking differently: Take 20 mEq by mouth daily. ), Disp: 180 tablet, Rfl: 0 .  timolol (TIMOPTIC) 0.5 % ophthalmic solution, Place 1 drop into both  eyes 2 (two) times daily. , Disp: , Rfl: 5 .  torsemide (DEMADEX) 20 MG tablet, TAKE 1 TABLET BY MOUTH DAILY (Patient taking differently: Take 20 mg by mouth daily. ), Disp: 90 tablet, Rfl: 0 .  verapamil (CALAN-SR) 240 MG CR tablet, TAKE 1 TABLET(240 MG) BY MOUTH TWICE DAILY, Disp: 180 tablet, Rfl: 0  Social History   Tobacco Use  Smoking Status Never Smoker  Smokeless Tobacco Never Used    Allergies  Allergen Reactions  . Dicloxacillin Other (See Comments)    GI upset   Objective:  There were no vitals filed for this visit. There is no height or weight on file to calculate BMI. Constitutional Well developed. Well nourished.  Vascular Dorsalis pedis pulses palpable bilaterally. Posterior tibial pulses palpable bilaterally. Capillary refill normal to all digits.  No cyanosis or clubbing noted. Pedal hair growth normal.  Neurologic Normal speech. Oriented to person, place, and time. Epicritic sensation to light touch grossly present bilaterally.  Dermatologic Nails well groomed and normal in appearance. No open wounds. No skin lesions.  Orthopedic: Normal joint ROM without pain or crepitus bilaterally. No visible deformities. Tender to palpation at the calcaneal tuber bilaterally. No pain with calcaneal squeeze bilaterally. Ankle ROM diminished range of motion bilaterally. Silfverskiold Test: postitive bilaterally.   Radiographs: None  Assessment:   1. Plantar fasciitis    Plan:  Patient was evaluated  and treated and all questions answered.  Plantar Fasciitis, bilaterally - Educated on icing and stretching. Instructions given.  - Injection delivered to the plantar fascia as below. - DME: PF Brace dispensed bilat.  Procedure: Injection Tendon/Ligament Location: Bilateral plantar fascia at the glabrous junction; medial approach. Skin Prep: alcohol Injectate: 1 cc 0.5% marcaine plain, 1 cc dexamethasone phosphate, 0.5 cc kenalog 10. Disposition: Patient tolerated  procedure well. Injection site dressed with a band-aid.  Return in about 6 weeks (around 08/28/2018) for Plantar fasciitis, Bilateral.

## 2018-07-23 DIAGNOSIS — R7303 Prediabetes: Secondary | ICD-10-CM | POA: Diagnosis not present

## 2018-07-23 DIAGNOSIS — E890 Postprocedural hypothyroidism: Secondary | ICD-10-CM | POA: Diagnosis not present

## 2018-07-23 DIAGNOSIS — E782 Mixed hyperlipidemia: Secondary | ICD-10-CM | POA: Diagnosis not present

## 2018-07-24 LAB — COMPLETE METABOLIC PANEL WITH GFR
AG Ratio: 1.5 (calc) (ref 1.0–2.5)
ALT: 9 U/L (ref 6–29)
AST: 15 U/L (ref 10–35)
Albumin: 4.1 g/dL (ref 3.6–5.1)
Alkaline phosphatase (APISO): 95 U/L (ref 37–153)
BUN/Creatinine Ratio: 15 (calc) (ref 6–22)
BUN: 15 mg/dL (ref 7–25)
CO2: 28 mmol/L (ref 20–32)
Calcium: 9.5 mg/dL (ref 8.6–10.4)
Chloride: 107 mmol/L (ref 98–110)
Creat: 1.01 mg/dL — ABNORMAL HIGH (ref 0.50–0.99)
GFR, Est African American: 66 mL/min/{1.73_m2} (ref >=60)
GFR, Est Non African American: 57 mL/min/{1.73_m2} — ABNORMAL LOW (ref >=60)
Globulin: 2.8 g/dL (calc) (ref 1.9–3.7)
Glucose, Bld: 89 mg/dL (ref 65–99)
Potassium: 4.1 mmol/L (ref 3.5–5.3)
Sodium: 146 mmol/L (ref 135–146)
Total Bilirubin: 0.4 mg/dL (ref 0.2–1.2)
Total Protein: 6.9 g/dL (ref 6.1–8.1)

## 2018-07-24 LAB — HEMOGLOBIN A1C
Hgb A1c MFr Bld: 5.4 % of total Hgb (ref <5.7)
Mean Plasma Glucose: 108 (calc)
eAG (mmol/L): 6 (calc)

## 2018-07-24 LAB — T4, FREE: Free T4: 1.8 ng/dL (ref 0.8–1.8)

## 2018-07-24 LAB — TSH: TSH: 0.54 mIU/L (ref 0.40–4.50)

## 2018-07-24 LAB — LIPID PANEL
Cholesterol: 184 mg/dL (ref <200)
HDL: 73 mg/dL (ref >=50)
LDL Cholesterol (Calc): 95 mg/dL (calc)
Non-HDL Cholesterol (Calc): 111 mg/dL (calc) (ref <130)
Total CHOL/HDL Ratio: 2.5 (calc) (ref <5.0)
Triglycerides: 75 mg/dL (ref <150)

## 2018-07-27 DIAGNOSIS — Z961 Presence of intraocular lens: Secondary | ICD-10-CM | POA: Diagnosis not present

## 2018-07-27 DIAGNOSIS — E119 Type 2 diabetes mellitus without complications: Secondary | ICD-10-CM | POA: Diagnosis not present

## 2018-07-27 DIAGNOSIS — H2511 Age-related nuclear cataract, right eye: Secondary | ICD-10-CM | POA: Diagnosis not present

## 2018-07-27 DIAGNOSIS — H35372 Puckering of macula, left eye: Secondary | ICD-10-CM | POA: Diagnosis not present

## 2018-07-27 DIAGNOSIS — Z79899 Other long term (current) drug therapy: Secondary | ICD-10-CM | POA: Diagnosis not present

## 2018-07-27 DIAGNOSIS — H35033 Hypertensive retinopathy, bilateral: Secondary | ICD-10-CM | POA: Diagnosis not present

## 2018-07-27 DIAGNOSIS — H20042 Secondary noninfectious iridocyclitis, left eye: Secondary | ICD-10-CM | POA: Diagnosis not present

## 2018-07-30 ENCOUNTER — Ambulatory Visit (INDEPENDENT_AMBULATORY_CARE_PROVIDER_SITE_OTHER): Payer: Medicare Other | Admitting: "Endocrinology

## 2018-07-30 ENCOUNTER — Encounter: Payer: Self-pay | Admitting: "Endocrinology

## 2018-07-30 DIAGNOSIS — R7303 Prediabetes: Secondary | ICD-10-CM | POA: Diagnosis not present

## 2018-07-30 DIAGNOSIS — E890 Postprocedural hypothyroidism: Secondary | ICD-10-CM | POA: Diagnosis not present

## 2018-07-30 DIAGNOSIS — E782 Mixed hyperlipidemia: Secondary | ICD-10-CM | POA: Diagnosis not present

## 2018-07-30 NOTE — Progress Notes (Signed)
Endocrinology Telephone Visit Follow up Note -During COVID -19 Pandemic    Subjective:    Patient ID: Danielle Howard, female    DOB: 12-Mar-1951, PCP Danielle Rossetti, MD   Past Medical History:  Diagnosis Date  . Arthritis   . Complication of anesthesia    nausea  . Diabetes mellitus without complication ([place])   . Headache   . Hypertension   . Hypothyroidism   . Hypothyroidism   . Iritis   . Thyroid disease    Past Surgical History:  Procedure Laterality Date  . ABDOMINAL HYSTERECTOMY    . CATARACT EXTRACTION Left 2019  . CHOLECYSTECTOMY     02/2012  . COLONOSCOPY N/A 05/25/2013   Procedure: COLONOSCOPY;  Surgeon: Rogene Houston, MD;  [facility]: [facility];  Service: Endoscopy;  Laterality: N/A;  830-moved to 1055 Ann to notify pt  . COLONOSCOPY N/A 06/21/2018   Procedure: COLONOSCOPY;  Surgeon: Rogene Houston, MD;  Location: [facility];  Service: Endoscopy;  Laterality: N/A;  1020  . JOINT REPLACEMENT Bilateral    2012,2013-bilateral knees  . POLYPECTOMY  06/21/2018   Procedure: POLYPECTOMY;  Surgeon: Rogene Houston, MD;  Location: [facility];  Service: Endoscopy;;  . THYROID SURGERY     Social History   Socioeconomic History  . Marital status: Single    Spouse name: Not on file  . Number of children: Not on file  . Years of education: Not on file  . Highest education level: Not on file  Occupational History  . Not on file  Social Needs  . Financial resource strain: Not on file  . Food insecurity:    Worry: Not on file    Inability: Not on file  . Transportation needs:    Medical: Not on file    Non-medical: Not on file  Tobacco Use  . Smoking status: Never Smoker  . Smokeless tobacco: Never Used  Substance and Sexual Activity  . Alcohol use: Yes    Comment: occasional  . Drug use: No  . Sexual activity: Yes  Lifestyle  . Physical activity:    Days per week: Not on file    Minutes per  session: Not on file  . Stress: Not on file  Relationships  . Social connections:    Talks on phone: Not on file    Gets together: Not on file    Attends religious service: Not on file    Active member of club or organization: Not on file    Attends meetings of clubs or organizations: Not on file    Relationship status: Not on file  Other Topics Concern  . Not on file  Social History Narrative  . Not on file   Outpatient Encounter Medications as of 07/30/2018  Medication Sig  . brimonidine (ALPHAGAN) 0.2 % ophthalmic solution Place 1 drop into the left eye 3 (three) times daily.   . Calcium Carb-Cholecalciferol (CALCIUM 600/VITAMIN D3 PO) Take 1 tablet by mouth daily.  . cholecalciferol (VITAMIN D) 1000 UNITS tablet Take 1,000 Units by mouth daily.  . cycloSPORINE modified (NEORAL) 25 MG capsule Take 25 mg by mouth 2 (two) times daily.  . folic  acid (FOLVITE) 1 MG tablet Take 1 mg by mouth daily.   Marland Kitchen levothyroxine (SYNTHROID, LEVOTHROID) 137 MCG tablet TAKE 1 TABLET(137 MCG) BY MOUTH DAILY  . metFORMIN (GLUCOPHAGE) 500 MG tablet TAKE 1 TABLET(500 MG) BY MOUTH DAILY AFTER BREAKFAST  . methotrexate (RHEUMATREX) 2.5 MG tablet Take 15 mg by mouth every Wednesday.   . Multiple Vitamins-Minerals (MULTIVITAMIN WITH MINERALS) tablet Take 1 tablet by mouth daily.  . polyethylene glycol-electrolytes (NULYTELY/GOLYTELY) 420 g solution   . potassium chloride SA (K-DUR,KLOR-CON) 20 MEQ tablet TAKE 1 TABLET BY MOUTH TWICE DAILY (Patient taking differently: Take 20 mEq by mouth daily. )  . timolol (TIMOPTIC) 0.5 % ophthalmic solution Place 1 drop into both eyes 2 (two) times daily.   Marland Kitchen torsemide (DEMADEX) 20 MG tablet TAKE 1 TABLET BY MOUTH DAILY (Patient taking differently: Take 20 mg by mouth daily. )  . verapamil (CALAN-SR) 240 MG CR tablet TAKE 1 TABLET(240 MG) BY MOUTH TWICE DAILY   No facility-administered encounter medications on file as of 07/30/2018.    ALLERGIES: Allergies  Allergen  Reactions  . Dicloxacillin Other (See Comments)    GI upset   VACCINATION STATUS: Immunization History  Administered Date(s) Administered  . Influenza Whole 02/09/2013  . Influenza, High Dose Seasonal PF 01/20/2017, 01/27/2018  . Influenza,inj,Quad PF,6+ Mos 01/15/2015, 01/25/2016  . Influenza-Unspecified 01/13/2014  . Pneumococcal Conjugate-13 01/15/2015  . Pneumococcal Polysaccharide-23 01/13/2014  . Tdap 10/02/2009  . Varicella 03/03/2010    HPI   [name] is a 68 year old female patient with medical history as above.  She is engaged in telephone visit this morning for follow-up of her  postsurgical hypothyroidism, prediabetes, hyperlipidemia.    She is on levothyroxine 137 mcg po qam. she reports compliance to his medication. She has no new complaints.  She is consistent taking her metformin, no side effects reported.  Her exercise habit is intermittent.    she has family hx of diabetes in her mother. she denies dysphagia, SOB, nor voice change.    Objective:    There were no vitals taken for this visit.  Wt Readings from Last 3 Encounters:  06/21/18 235 lb (106.6 kg)  04/07/18 232 lb (105.2 kg)  01/28/18 235 lb (106.6 kg)      Recent Results (from the past 2160 hour(s))  Glucose, capillary     Status: None   Collection Time: 06/21/18  9:37 AM  Result Value Ref Range   Glucose-Capillary 74 70 - 99 mg/dL  Glucose, capillary     Status: Abnormal   Collection Time: 06/21/18  9:56 AM  Result Value Ref Range   Glucose-Capillary 69 (L) 70 - 99 mg/dL  Hemoglobin A1c     Status: None   Collection Time: 07/23/18  7:57 AM  Result Value Ref Range   Hgb A1c MFr Bld 5.4 <5.7 % of total Hgb    Comment: For the purpose of screening for the presence of diabetes: . <5.7%       Consistent with the absence of diabetes 5.7-6.4%    Consistent with increased risk for diabetes             (prediabetes) > or =6.5%  Consistent with diabetes . This assay result is consistent  with a decreased risk of diabetes. . Currently, no consensus exists regarding use of hemoglobin A1c for diagnosis of diabetes in children. . According to American Diabetes Association (ADA) guidelines, hemoglobin A1c <7.0% represents optimal control in non-pregnant diabetic patients. Different metrics may apply to  specific patient populations.  Standards of Medical Care in Diabetes(ADA). .    Mean Plasma Glucose 108 (calc)   eAG (mmol/L) 6.0 (calc)  COMPLETE METABOLIC PANEL WITH GFR     Status: Abnormal   Collection Time: 07/23/18  7:57 AM  Result Value Ref Range   Glucose, Bld 89 65 - 99 mg/dL    Comment: .            Fasting reference interval .    BUN 15 7 - 25 mg/dL   Creat 1.01 (H) 0.50 - 0.99 mg/dL    Comment: For patients >70 years of age, the reference limit for Creatinine is approximately 13% higher for people identified as African-American. .    GFR, Est Non African American 57 (L) > OR = 60 mL/min/1.46m2   GFR, Est African American 66 > OR = 60 mL/min/1.3m2   BUN/Creatinine Ratio 15 6 - 22 (calc)   Sodium 146 135 - 146 mmol/L   Potassium 4.1 3.5 - 5.3 mmol/L   Chloride 107 98 - 110 mmol/L   CO2 28 20 - 32 mmol/L   Calcium 9.5 8.6 - 10.4 mg/dL   Total Protein 6.9 6.1 - 8.1 g/dL   Albumin 4.1 3.6 - 5.1 g/dL   Globulin 2.8 1.9 - 3.7 g/dL (calc)   AG Ratio 1.5 1.0 - 2.5 (calc)   Total Bilirubin 0.4 0.2 - 1.2 mg/dL   Alkaline phosphatase (APISO) 95 37 - 153 U/L   AST 15 10 - 35 U/L   ALT 9 6 - 29 U/L  TSH     Status: None   Collection Time: 07/23/18  7:57 AM  Result Value Ref Range   TSH 0.54 0.40 - 4.50 mIU/L  T4, free     Status: None   Collection Time: 07/23/18  7:57 AM  Result Value Ref Range   Free T4 1.8 0.8 - 1.8 ng/dL  Lipid panel     Status: None   Collection Time: 07/23/18  7:57 AM  Result Value Ref Range   Cholesterol 184 <200 mg/dL   HDL 73 > OR = 50 mg/dL   Triglycerides 75 <150 mg/dL   LDL Cholesterol (Calc) 95 mg/dL (calc)    Comment:  Reference range: <100 . Desirable range <100 mg/dL for primary prevention;   <70 mg/dL for patients with CHD or diabetic patients  with > or = 2 CHD risk factors. Marland Kitchen LDL-C is now calculated using the Martin-Hopkins  calculation, which is a validated novel method providing  better accuracy than the Friedewald equation in the  estimation of LDL-C.  Cresenciano Genre et al. Annamaria Helling. 8366;294(76): 2061-2068  (http://education.[facility].com/faq/FAQ164)    Total CHOL/HDL Ratio 2.5 <5.0 (calc)   Non-HDL Cholesterol (Calc) 111 <130 mg/dL (calc)    Comment: For patients with diabetes plus 1 major ASCVD risk  factor, treating to a non-HDL-C goal of <100 mg/dL  (LDL-C of <70 mg/dL) is considered a therapeutic  option.      CMP     Component Value Date/Time   NA 146 07/23/2018 0757   K 4.1 07/23/2018 0757   CL 107 07/23/2018 0757   CO2 28 07/23/2018 0757   GLUCOSE 89 07/23/2018 0757   BUN 15 07/23/2018 0757   CREATININE 1.01 (H) 07/23/2018 0757   CALCIUM 9.5 07/23/2018 0757   PROT 6.9 07/23/2018 0757   ALBUMIN 3.8 07/15/2016 0859   AST 15 07/23/2018 0757   ALT 9 07/23/2018 0757   ALKPHOS 90 07/15/2016 0859  BILITOT 0.4 07/23/2018 0757   GFRNONAA 57 (L) 07/23/2018 0757   GFRAA 66 07/23/2018 0757     Diabetic Labs (most recent): Lab Results  Component Value Date   HGBA1C 5.4 07/23/2018   HGBA1C 5.7 (H) 07/15/2017   HGBA1C 5.5 07/15/2016     Lipid Panel ( most recent) Lipid Panel     Component Value Date/Time   CHOL 184 07/23/2018 0757   TRIG 75 07/23/2018 0757   HDL 73 07/23/2018 0757   CHOLHDL 2.5 07/23/2018 0757   VLDL 17 07/15/2016 0859   LDLCALC 95 07/23/2018 0757       Assessment & Plan:   1. Hypothyroidism: 2. Hashimoto's thyroiditis 3. Left sided hemithyroidectomy  Her previsit thyroid function tests are consistent with appropriate replacement.  She is advised to continue  levothyroxine 137 mcg p.o. every morning.    - We discussed about the correct  intake of her thyroid hormone, on empty stomach at fasting, with water, separated by at least 30 minutes from breakfast and other medications,  and separated by more than 4 hours from calcium, iron, multivitamins, acid reflux medications (PPIs). -Patient is made aware of the fact that thyroid hormone replacement is needed for life, dose to be adjusted by periodic monitoring of thyroid function tests.  Her last thyroid u/s showed normal size right lobe with no nodules.   Pre-diabetes -Her recent A1c was 5.7% still consistent with prediabetes.  She is advised to continue metformin 500 mg p.o. daily after breakfast.    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 Surgically absent left lobe of the thyroid.   - I advised patient to maintain close follow up with Danielle Rossetti, MD for primary care needs. Follow up plan: Return in about 4 months (around 11/29/2018) for Follow up with Pre-visit Labs.  Glade Lloyd, MD Phone: [phone]  Fax: [phone]  This note was partially dictated with voice recognition software. Similar sounding words can be transcribed inadequately or may not  be corrected upon review.  07/30/2018, 8:55 AM

## 2018-08-09 ENCOUNTER — Ambulatory Visit (INDEPENDENT_AMBULATORY_CARE_PROVIDER_SITE_OTHER): Payer: Medicare Other | Admitting: Family Medicine

## 2018-08-09 ENCOUNTER — Encounter: Payer: Self-pay | Admitting: Family Medicine

## 2018-08-09 ENCOUNTER — Other Ambulatory Visit: Payer: Self-pay

## 2018-08-09 VITALS — BP 138/72 | HR 64 | Temp 98.4°F | Resp 14 | Ht 66.0 in | Wt 236.0 lb

## 2018-08-09 DIAGNOSIS — R609 Edema, unspecified: Secondary | ICD-10-CM

## 2018-08-09 DIAGNOSIS — Z6838 Body mass index (BMI) 38.0-38.9, adult: Secondary | ICD-10-CM

## 2018-08-09 DIAGNOSIS — E782 Mixed hyperlipidemia: Secondary | ICD-10-CM

## 2018-08-09 DIAGNOSIS — I10 Essential (primary) hypertension: Secondary | ICD-10-CM | POA: Diagnosis not present

## 2018-08-09 DIAGNOSIS — R7303 Prediabetes: Secondary | ICD-10-CM

## 2018-08-09 MED ORDER — VERAPAMIL HCL ER 240 MG PO TBCR: EXTENDED_RELEASE_TABLET | ORAL | 1 refills | Status: DC

## 2018-08-09 MED ORDER — POTASSIUM CHLORIDE CRYS ER 20 MEQ PO TBCR: 20.0000 meq | EXTENDED_RELEASE_TABLET | Freq: Two times a day (BID) | ORAL | 0 refills | Status: DC

## 2018-08-09 MED ORDER — LANCETS MISC: 3 refills | Status: DC

## 2018-08-09 MED ORDER — TORSEMIDE 20 MG PO TABS: 20.0000 mg | ORAL_TABLET | Freq: Every day | ORAL | 1 refills | Status: DC

## 2018-08-09 MED ORDER — BLOOD GLUCOSE SYSTEM PAK KIT: PACK | 1 refills | Status: DC

## 2018-08-09 MED ORDER — BLOOD GLUCOSE TEST VI STRP: ORAL_STRIP | 3 refills | Status: DC

## 2018-08-09 NOTE — Patient Instructions (Signed)
F/U 4 months  

## 2018-08-09 NOTE — Assessment & Plan Note (Signed)
A1C is normal, endocrinology still has on metformin 500mg  daily Will give glucometer, she did have hypoglycemia episode after colonoscopy in Feb Will let her check CBG a few times a week, if getting hypoglycemia episodes recommend discontinuing metformin

## 2018-08-09 NOTE — Assessment & Plan Note (Signed)
Well controlled no change to verapamil Renal function reviewed at goal

## 2018-08-09 NOTE — Assessment & Plan Note (Signed)
Continue lasix  °

## 2018-08-09 NOTE — Progress Notes (Signed)
   Subjective:    Patient ID: Danielle Howard, female    DOB: August 21, 1950, 68 y.o.   MRN: [id number]  Patient presents for Follow-up (is fating)  Pt here to f/u chronic medical problems  - reviewed recent fasting labs at bedside with patient   Medications reviewed  Followed by endocrinology for her diabetes and hypothyroidism, currently on metformin 500mg  daily - last A1C 5.4% and synthroid 185mcg daily   HTN- taking verapamil ,normal renal at baseline Cr 15/ BUN 1.01   Hyperlipidemia- normal lipids 2 weeks ago , LDL at 95, has cut out fried foods   Curreny on Methotrexate 25mg , for iritis, cyclosporin 50mg  BID , told pressure very high in      With folic acid , has f/u end of April  Followed by podiatry- had injection for plantar fascitis,wearing brace of right foot  No new concerns    Review Of Systems:  GEN- denies fatigue, fever, weight loss,weakness, recent illness HEENT- denies eye drainage, change in vision, nasal discharge, CVS- denies chest pain, palpitations RESP- denies SOB, cough, wheeze ABD- denies N/V, change in stools, abd pain GU- denies dysuria, hematuria, dribbling, incontinence MSK- denies joint pain, muscle aches, injury Neuro- denies headache, dizziness, syncope, seizure activity       Objective:    BP 138/72   Pulse 64   Temp 98.4 F (36.9 C) (Oral)   Resp 14   Ht 5\' 6"  (1.676 m)   Wt 236 lb (107 kg)   SpO2 99%   BMI 38.09 kg/m  GEN- NAD, alert and oriented x3 HEENT- PERRL, EOMI, non injected sclera, pink conjunctiva, MMM, oropharynx clear Neck- Supple, no thyromegaly CVS- RRR, no murmur RESP-CTAB ABD-NABS,soft,NT,ND EXT- pedal edema Pulses- Radial, DP- 2+        Assessment & Plan:      Problem List Items Addressed This Visit      Unprioritized   Essential hypertension, benign - Primary    Well controlled no change to verapamil Renal function reviewed at goal      Relevant Medications   verapamil (CALAN-SR) 240 MG CR tablet   torsemide (DEMADEX) 20 MG tablet   Mixed hyperlipidemia    Lipids at goal, continue to work on dietary changes      Relevant Medications   verapamil (CALAN-SR) 240 MG CR tablet   torsemide (DEMADEX) 20 MG tablet   Obesity   Peripheral edema    Continue lasix      Prediabetes    A1C is normal, endocrinology still has on metformin 500mg  daily Will give glucometer, she did have hypoglycemia episode after colonoscopy in Feb Will let her check CBG a few times a week, if getting hypoglycemia episodes recommend discontinuing metformin      Relevant Orders   Microalbumin / creatinine urine ratio      Note: This dictation was prepared with Dragon dictation along with smaller phrase technology. Any transcriptional errors that result from this process are unintentional.

## 2018-08-09 NOTE — Assessment & Plan Note (Signed)
Lipids at goal, continue to work on dietary changes

## 2018-08-10 LAB — MICROALBUMIN / CREATININE URINE RATIO
Creatinine, Urine: 99 mg/dL (ref 20–275)
Microalb Creat Ratio: 4 mcg/mg creat (ref <30)
Microalb, Ur: 0.4 mg/dL

## 2018-08-24 DIAGNOSIS — E119 Type 2 diabetes mellitus without complications: Secondary | ICD-10-CM | POA: Diagnosis not present

## 2018-08-24 DIAGNOSIS — Z79899 Other long term (current) drug therapy: Secondary | ICD-10-CM | POA: Diagnosis not present

## 2018-08-24 DIAGNOSIS — H3581 Retinal edema: Secondary | ICD-10-CM | POA: Diagnosis not present

## 2018-08-24 DIAGNOSIS — Z961 Presence of intraocular lens: Secondary | ICD-10-CM | POA: Diagnosis not present

## 2018-08-24 DIAGNOSIS — H35033 Hypertensive retinopathy, bilateral: Secondary | ICD-10-CM | POA: Diagnosis not present

## 2018-08-24 DIAGNOSIS — H35372 Puckering of macula, left eye: Secondary | ICD-10-CM | POA: Diagnosis not present

## 2018-08-24 DIAGNOSIS — H20042 Secondary noninfectious iridocyclitis, left eye: Secondary | ICD-10-CM | POA: Diagnosis not present

## 2018-08-24 DIAGNOSIS — H2511 Age-related nuclear cataract, right eye: Secondary | ICD-10-CM | POA: Diagnosis not present

## 2018-08-27 ENCOUNTER — Other Ambulatory Visit: Payer: Self-pay

## 2018-08-27 ENCOUNTER — Ambulatory Visit (INDEPENDENT_AMBULATORY_CARE_PROVIDER_SITE_OTHER): Payer: Medicare Other | Admitting: Podiatry

## 2018-08-27 ENCOUNTER — Encounter: Payer: Self-pay | Admitting: Podiatry

## 2018-08-27 VITALS — Temp 97.7°F

## 2018-08-27 DIAGNOSIS — M216X9 Other acquired deformities of unspecified foot: Secondary | ICD-10-CM

## 2018-08-27 DIAGNOSIS — M722 Plantar fascial fibromatosis: Secondary | ICD-10-CM

## 2018-09-02 ENCOUNTER — Ambulatory Visit: Payer: Medicare Other | Admitting: Orthotics

## 2018-09-02 ENCOUNTER — Other Ambulatory Visit: Payer: Self-pay

## 2018-09-02 DIAGNOSIS — M216X9 Other acquired deformities of unspecified foot: Secondary | ICD-10-CM

## 2018-09-02 DIAGNOSIS — M722 Plantar fascial fibromatosis: Secondary | ICD-10-CM

## 2018-09-02 NOTE — Progress Notes (Signed)
Punched out heel, added offload for bone spur; added scaphoid pad to make f/o (got elsewhere) more comfortable.

## 2018-09-10 ENCOUNTER — Other Ambulatory Visit (HOSPITAL_COMMUNITY): Payer: Self-pay | Admitting: Family Medicine

## 2018-09-10 DIAGNOSIS — Z1231 Encounter for screening mammogram for malignant neoplasm of breast: Secondary | ICD-10-CM

## 2018-09-20 ENCOUNTER — Other Ambulatory Visit: Payer: Self-pay | Admitting: "Endocrinology

## 2018-09-22 ENCOUNTER — Other Ambulatory Visit: Payer: Self-pay | Admitting: "Endocrinology

## 2018-10-12 ENCOUNTER — Other Ambulatory Visit: Payer: Self-pay

## 2018-10-12 ENCOUNTER — Ambulatory Visit (INDEPENDENT_AMBULATORY_CARE_PROVIDER_SITE_OTHER): Payer: Medicare Other | Admitting: Family Medicine

## 2018-10-12 ENCOUNTER — Encounter: Payer: Self-pay | Admitting: Family Medicine

## 2018-10-12 VITALS — BP 122/74 | HR 74 | Temp 98.2°F | Resp 15 | Ht 66.0 in | Wt 236.4 lb

## 2018-10-12 DIAGNOSIS — H35033 Hypertensive retinopathy, bilateral: Secondary | ICD-10-CM | POA: Diagnosis not present

## 2018-10-12 DIAGNOSIS — H3581 Retinal edema: Secondary | ICD-10-CM | POA: Diagnosis not present

## 2018-10-12 DIAGNOSIS — R609 Edema, unspecified: Secondary | ICD-10-CM

## 2018-10-12 DIAGNOSIS — H35372 Puckering of macula, left eye: Secondary | ICD-10-CM | POA: Diagnosis not present

## 2018-10-12 DIAGNOSIS — H2511 Age-related nuclear cataract, right eye: Secondary | ICD-10-CM | POA: Diagnosis not present

## 2018-10-12 DIAGNOSIS — M79604 Pain in right leg: Secondary | ICD-10-CM

## 2018-10-12 DIAGNOSIS — E119 Type 2 diabetes mellitus without complications: Secondary | ICD-10-CM | POA: Diagnosis not present

## 2018-10-12 DIAGNOSIS — M10071 Idiopathic gout, right ankle and foot: Secondary | ICD-10-CM | POA: Diagnosis not present

## 2018-10-12 DIAGNOSIS — R7989 Other specified abnormal findings of blood chemistry: Secondary | ICD-10-CM | POA: Diagnosis not present

## 2018-10-12 DIAGNOSIS — H20042 Secondary noninfectious iridocyclitis, left eye: Secondary | ICD-10-CM | POA: Diagnosis not present

## 2018-10-12 DIAGNOSIS — Z79899 Other long term (current) drug therapy: Secondary | ICD-10-CM | POA: Diagnosis not present

## 2018-10-12 DIAGNOSIS — Z961 Presence of intraocular lens: Secondary | ICD-10-CM | POA: Diagnosis not present

## 2018-10-12 LAB — BASIC METABOLIC PANEL
BUN/Creatinine Ratio: 12 (calc) (ref 6–22)
BUN: 16 mg/dL (ref 7–25)
CO2: 28 mmol/L (ref 20–32)
Calcium: 9.8 mg/dL (ref 8.6–10.4)
Chloride: 101 mmol/L (ref 98–110)
Creat: 1.34 mg/dL — ABNORMAL HIGH (ref 0.50–0.99)
Glucose, Bld: 83 mg/dL (ref 65–99)
Potassium: 4.8 mmol/L (ref 3.5–5.3)
Sodium: 140 mmol/L (ref 135–146)

## 2018-10-12 LAB — CBC WITH DIFFERENTIAL/PLATELET
Absolute Monocytes: 588 cells/uL (ref 200–950)
Basophils Absolute: 30 cells/uL (ref 0–200)
Basophils Relative: 0.5 %
Eosinophils Absolute: 120 cells/uL (ref 15–500)
Eosinophils Relative: 2 %
HCT: 36.6 % (ref 35.0–45.0)
Hemoglobin: 11.9 g/dL (ref 11.7–15.5)
Lymphs Abs: 1890 cells/uL (ref 850–3900)
MCH: 28.7 pg (ref 27.0–33.0)
MCHC: 32.5 g/dL (ref 32.0–36.0)
MCV: 88.2 fL (ref 80.0–100.0)
MPV: 10 fL (ref 7.5–12.5)
Monocytes Relative: 9.8 %
Neutro Abs: 3372 cells/uL (ref 1500–7800)
Neutrophils Relative %: 56.2 %
Platelets: 457 10*3/uL — ABNORMAL HIGH (ref 140–400)
RBC: 4.15 10*6/uL (ref 3.80–5.10)
RDW: 14.5 % (ref 11.0–15.0)
Total Lymphocyte: 31.5 %
WBC: 6 10*3/uL (ref 3.8–10.8)

## 2018-10-12 LAB — D-DIMER, QUANTITATIVE: D-Dimer, Quant: 0.77 mcg/mL FEU — ABNORMAL HIGH (ref <0.50)

## 2018-10-12 LAB — URIC ACID: Uric Acid, Serum: 8.1 mg/dL — ABNORMAL HIGH (ref 2.5–7.0)

## 2018-10-12 MED ORDER — COLCHICINE 0.6 MG PO TABS: 0.6000 mg | ORAL_TABLET | Freq: Every day | ORAL | 0 refills | Status: DC

## 2018-10-12 MED ORDER — PREDNISONE 10 MG PO TABS: ORAL_TABLET | ORAL | 0 refills | Status: DC

## 2018-10-12 NOTE — Patient Instructions (Addendum)
Increase demadex to 40mg  and take 2 of the potassium We will call with lab results  Elevate your feet  F/U Monday for recheck

## 2018-10-12 NOTE — Progress Notes (Signed)
   Subjective:    Patient ID: Danielle Howard, female    DOB: 03-11-1951, 68 y.o.   MRN: [id number]  Patient presents for Leg Swelling (In with c/o right lower leg swelling )      Pt here with right leg pain and swelling for past few weeks. Cant walk due to th epain, worse of the over the weekend  Pain rigtht top of her foot as well as the ankle which is the worst.  She has been up on her feet more than usual.  She has been taking her water pills daily she actually increase to 1-1/2 which would be 30 mg of the torsemide.  Denies any difficulty breathing denies any chest pain.  Has not had any fever.  No injury to her leg. She has Not had any prolonged car rides.      Review Of Systems:  GEN- denies fatigue, fever, weight loss,weakness, recent illness HEENT- denies eye drainage, change in vision, nasal discharge, CVS- denies chest pain, palpitations RESP- denies SOB, cough, wheeze ABD- denies N/V, change in stools, abd pain GU- denies dysuria, hematuria, dribbling, incontinence MSK- + joint pain, muscle aches, injury Neuro- denies headache, dizziness, syncope, seizure activity       Objective:    BP 122/74   Pulse 74   Temp 98.2 F (36.8 C) (Oral)   Resp 15   Ht 5\' 6"  (1.676 m)   Wt 236 lb 6 oz (107.2 kg)   SpO2 98%   BMI 38.15 kg/m  GEN- NAD, alert and oriented x3 HEENT- PERRL, EOMI, non injected sclera, pink conjunctiva, MMM, oropharynx clear CVS- RRR, no murmur RESP-CTAB ABD-NABS,soft,NT,ND EXT- 2+ edema  R > L, mild warmth top of her right foot and ankle, no erythema, decreased ROM ankle, TTP over swelling  Pulses- Radial,2+ DP- diminished with swelling         Assessment & Plan:      Problem List Items Addressed This Visit      Unprioritized   Peripheral edema - Primary    Increase demadex to 40mg  for  3 days only due to ARI Cr  1.34      Relevant Orders   CBC with Differential/Platelet (Completed)   Basic metabolic panel (Completed)   D-dimer,  quantitative (not at [facility])   Uric Acid (Completed)    Other Visit Diagnoses    Pain of right lower extremity       d Dimer to r/u blood clot initially sent with labs if positive [place] to be done   Relevant Orders   D-dimer, quantitative (not at [facility])   Uric Acid (Completed)   Acute idiopathic gout of right foot       Elevated uric acid on top of her peripheral edema. Start prednisone taper, due to interaction of colchicine with cyclosporine   Relevant Medications   colchicine 0.6 MG tablet   predniSONE (DELTASONE) 10 MG tablet      Note: This dictation was prepared with Dragon dictation along with smaller phrase technology. Any transcriptional errors that result from this process are unintentional.

## 2018-10-12 NOTE — Assessment & Plan Note (Signed)
Increase demadex to 40mg  for  3 days only due to ARI Cr  1.34

## 2018-10-12 NOTE — Addendum Note (Signed)
Addended by: Vic Blackbird F on: 10/12/2018 05:19 PM   Modules accepted: Orders

## 2018-10-13 ENCOUNTER — Other Ambulatory Visit: Payer: Self-pay | Admitting: Family Medicine

## 2018-10-13 ENCOUNTER — Ambulatory Visit (HOSPITAL_COMMUNITY)
Admission: RE | Admit: 2018-10-13 | Discharge: 2018-10-13 | Disposition: A | Discharge status: Home / Self Care | Payer: Medicare Other | Source: Ambulatory Visit | Attending: Family Medicine | Admitting: Family Medicine

## 2018-10-13 DIAGNOSIS — R7989 Other specified abnormal findings of blood chemistry: Secondary | ICD-10-CM

## 2018-10-13 DIAGNOSIS — R609 Edema, unspecified: Secondary | ICD-10-CM | POA: Insufficient documentation

## 2018-10-13 DIAGNOSIS — M10071 Idiopathic gout, right ankle and foot: Secondary | ICD-10-CM | POA: Insufficient documentation

## 2018-10-13 DIAGNOSIS — M7989 Other specified soft tissue disorders: Secondary | ICD-10-CM | POA: Diagnosis not present

## 2018-10-18 ENCOUNTER — Other Ambulatory Visit: Payer: Self-pay

## 2018-10-18 ENCOUNTER — Ambulatory Visit (INDEPENDENT_AMBULATORY_CARE_PROVIDER_SITE_OTHER): Payer: Medicare Other | Admitting: Family Medicine

## 2018-10-18 ENCOUNTER — Encounter: Payer: Self-pay | Admitting: Family Medicine

## 2018-10-18 VITALS — BP 132/76 | HR 88 | Temp 97.8°F | Resp 14 | Ht 66.0 in | Wt 234.4 lb

## 2018-10-18 DIAGNOSIS — N179 Acute kidney failure, unspecified: Secondary | ICD-10-CM

## 2018-10-18 DIAGNOSIS — M10079 Idiopathic gout, unspecified ankle and foot: Secondary | ICD-10-CM | POA: Diagnosis not present

## 2018-10-18 DIAGNOSIS — M109 Gout, unspecified: Secondary | ICD-10-CM | POA: Insufficient documentation

## 2018-10-18 DIAGNOSIS — R609 Edema, unspecified: Secondary | ICD-10-CM | POA: Diagnosis not present

## 2018-10-18 NOTE — Patient Instructions (Addendum)
Gout- look at dietary changes  Work on dietary changes Try [place] the prednisone Stop the extra the vitamin D  F/U  As previous   Low-Purine Eating Plan A low-purine eating plan involves making food choices to limit your intake of purine. Purine is a kind of uric acid. Too much uric acid in your blood can cause certain conditions, such as gout and kidney stones. Eating a low-purine diet can help control these conditions. What are tips for following this plan? Reading food labels   Avoid foods with saturated or Trans fat.  Check the ingredient list of grains-based foods, such as bread and cereal, to make sure that they contain whole grains.  Check the ingredient list of sauces or soups to make sure they do not contain meat or fish.  When choosing soft drinks, check the ingredient list to make sure they do not contain high-fructose corn syrup. Shopping  Buy plenty of fresh fruits and vegetables.  Avoid buying canned or fresh fish.  Buy dairy products labeled as low-fat or nonfat.  Avoid buying premade or processed foods. These foods are often high in fat, salt (sodium), and added sugar. Cooking  Use olive oil instead of butter when cooking. Oils like olive oil, canola oil, and sunflower oil contain healthy fats. Meal planning  Learn which foods do or do not affect you. If you find out that a food tends to cause your gout symptoms to flare up, avoid eating that food. You can enjoy foods that do not cause problems. If you have any questions about a food item, talk with your dietitian or health care provider.  Limit foods high in fat, especially saturated fat. Fat makes it harder for your body to get rid of uric acid.  Choose foods that are lower in fat and are lean sources of protein. General guidelines  Limit alcohol intake to no more than 1 drink a day for nonpregnant women and 2 drinks a day for men. One drink equals 12 oz of beer, 5 oz of wine, or 1 oz of  hard liquor. Alcohol can affect the way your body gets rid of uric acid.  Drink plenty of water to keep your urine clear or pale yellow. Fluids can help remove uric acid from your body.  If directed by your health care provider, take a vitamin C supplement.  Work with your health care provider and dietitian to develop a plan to achieve or maintain a healthy weight. Losing weight can help reduce uric acid in your blood. What foods are recommended? The items listed may not be a complete list. Talk with your dietitian about what dietary choices are best for you. Foods low in purines Foods low in purines do not need to be limited. These include:  All fruits.  All low-purine vegetables, pickles, and olives.  Breads, pasta, rice, cornbread, and popcorn. Cake and other baked goods.  All dairy foods.  Eggs, nuts, and nut butters.  Spices and condiments, such as salt, herbs, and vinegar.  Plant oils, butter, and margarine.  Water, sugar-free soft drinks, tea, coffee, and cocoa.  Vegetable-based soups, broths, sauces, and gravies. Foods moderate in purines Foods moderate in purines should be limited to the amounts listed.   cup of asparagus, cauliflower, spinach, mushrooms, or green peas, each day.  2/3 cup uncooked oatmeal, each day.   cup dry wheat bran or wheat germ, each day.  2-3 ounces of meat or poultry, each day.  4-6  ounces of shellfish, such as crab, lobster, oysters, or shrimp, each day.  1 cup cooked beans, peas, or lentils, each day.  Soup, broths, or bouillon made from meat or fish. Limit these foods as much as possible. What foods are not recommended? The items listed may not be a complete list. Talk with your dietitian about what dietary choices are best for you. Limit your intake of foods high in purines, including:  Beer and other alcohol.  Meat-based gravy or sauce.  Canned or fresh fish, such as: ? Anchovies, sardines, herring, and tuna. ? Mussels  and scallops. ? Codfish, trout, and haddock.  Bern[place] Salines.  Organ meats, such as: ? Liver or kidney. ? Tripe. ? Sweetbreads (thymus gland or pancreas).  Wild Clinical biochemist.  Yeast or yeast extract supplements.  Drinks sweetened with high-fructose corn syrup. Summary  Eating a low-purine diet can help control conditions caused by too much uric acid in the body, such as gout or kidney stones.  Choose low-purine foods, limit alcohol, and limit foods high in fat.  You will learn over time which foods do or do not affect you. If you find out that a food tends to cause your gout symptoms to flare up, avoid eating that food. This information is not intended to replace advice given to you by your health care provider. Make sure you discuss any questions you have with your health care provider. Document Released: 08/16/2010 Document Revised: 06/04/2016 Document Reviewed: 06/04/2016 Elsevier Interactive Patient Education  2019 [employer].

## 2018-10-18 NOTE — Progress Notes (Signed)
   Subjective:    Patient ID: Danielle Howard, female    DOB: 1951/03/18, 68 y.o.   MRN: [id number]  Patient presents for Follow-up (edema/ gout)   Pt here for inermin F/U on leg swelling,uric acid elevated and d dimer. US obtained and negative. Gout treated with prednisone taper due to her opthalmology medications and interaction with cyclosporine Swelling has gone down significantly the pain has pretty much resolved.  She is able to walk much better.  She still has a few days left on the prednisone she is back on her 20 mg of her diuretic    All of her meds and supplements reviewed at bedside,   adjustments made in MVI, calcium 1200mg , Vitamin D 1000IU  Review Of Systems:  GEN- denies fatigue, fever, weight loss,weakness, recent illness HEENT- denies eye drainage, change in vision, nasal discharge, CVS- denies chest pain, palpitations RESP- denies SOB, cough, wheeze ABD- denies N/V, change in stools, abd pain GU- denies dysuria, hematuria, dribbling, incontinence MSK- + joint pain, muscle aches, injury Neuro- denies headache, dizziness, syncope, seizure activity       Objective:    BP 132/76   Pulse 88   Temp 97.8 F (36.6 C) (Oral)   Resp 14   Ht 5\' 6"  (1.676 m)   Wt 234 lb 6.4 oz (106.3 kg)   SpO2 99%   BMI 37.83 kg/m  GEN- NAD, alert and oriented x 3  CVS- RRR, no murmur RESP-CTAB EXT- non pitting edema bilat back to baseline around 1 + near ankle, no erythema, no warmth, swelling resolved on top of foot Pulses- Radial,2+ DP- palpated          Assessment & Plan:      Problem List Items Addressed This Visit      Unprioritized   Gout - Primary    Complete prednisone, discussed dietary changes for gout, cherry tart juice, before adding allopurnol to her multiple meds      Peripheral edema    Other Visit Diagnoses    Acute renal failure, unspecified acute renal failure type ([place])       Edema overall improved more of her baseline, continue duiretic, recheck  renal function   Relevant Orders   Basic metabolic panel (Completed)      Note: This dictation was prepared with Dragon dictation along with smaller phrase technology. Any transcriptional errors that result from this process are unintentional.

## 2018-10-19 ENCOUNTER — Encounter: Payer: Self-pay | Admitting: Family Medicine

## 2018-10-19 LAB — BASIC METABOLIC PANEL
BUN/Creatinine Ratio: 20 (calc) (ref 6–22)
BUN: 27 mg/dL — ABNORMAL HIGH (ref 7–25)
CO2: 28 mmol/L (ref 20–32)
Calcium: 10.4 mg/dL (ref 8.6–10.4)
Chloride: 101 mmol/L (ref 98–110)
Creat: 1.34 mg/dL — ABNORMAL HIGH (ref 0.50–0.99)
Glucose, Bld: 83 mg/dL (ref 65–99)
Potassium: 4.4 mmol/L (ref 3.5–5.3)
Sodium: 141 mmol/L (ref 135–146)

## 2018-10-19 NOTE — Assessment & Plan Note (Signed)
Complete prednisone, discussed dietary changes for gout, cherry tart juice, before adding allopurnol to her multiple meds

## 2018-10-27 ENCOUNTER — Ambulatory Visit (HOSPITAL_COMMUNITY): Payer: Medicare Other

## 2018-10-29 ENCOUNTER — Ambulatory Visit (HOSPITAL_COMMUNITY)
Admission: RE | Admit: 2018-10-29 | Discharge: 2018-10-29 | Disposition: A | Discharge status: Home / Self Care | Payer: Medicare Other | Source: Ambulatory Visit | Attending: Family Medicine | Admitting: Family Medicine

## 2018-10-29 ENCOUNTER — Other Ambulatory Visit: Payer: Self-pay

## 2018-10-29 DIAGNOSIS — Z1231 Encounter for screening mammogram for malignant neoplasm of breast: Secondary | ICD-10-CM | POA: Diagnosis not present

## 2018-11-02 ENCOUNTER — Other Ambulatory Visit: Payer: Medicare Other

## 2018-11-02 ENCOUNTER — Other Ambulatory Visit: Payer: Self-pay

## 2018-11-02 DIAGNOSIS — R6889 Other general symptoms and signs: Secondary | ICD-10-CM | POA: Diagnosis not present

## 2018-11-02 DIAGNOSIS — Z20822 Contact with and (suspected) exposure to covid-19: Secondary | ICD-10-CM

## 2018-11-02 NOTE — Progress Notes (Signed)
Subjective:  Patient ID: Danielle Howard, female    DOB: 03-20-1951,  MRN: [id number]  Chief Complaint  Patient presents with  . Plantar Fasciitis    Follow up bilateral heels   "The left one is fine, no pain, but the right one is hurting still"    68 y.o. female presents with the above complaint. Hx as above.   Review of Systems: Negative except as noted in the HPI. Denies N/V/F/Ch.  Past Medical History:  Diagnosis Date  . Arthritis   . Complication of anesthesia    nausea  . Diabetes mellitus without complication ([place])   . Headache   . Hypertension   . Hypothyroidism   . Hypothyroidism   . Iritis   . Thyroid disease     Current Outpatient Medications:  .  brimonidine (ALPHAGAN) 0.2 % ophthalmic solution, Place 1 drop into the left eye 3 (three) times daily. , Disp: , Rfl: 5 .  Calcium Carb-Cholecalciferol (CALCIUM 600/VITAMIN D3 PO), Take 1 tablet by mouth daily., Disp: , Rfl:  .  cholecalciferol (VITAMIN D) 1000 UNITS tablet, Take 1,000 Units by mouth daily., Disp: , Rfl:  .  colchicine 0.6 MG tablet, Take 1 tablet (0.6 mg total) by mouth daily., Disp: 30 tablet, Rfl: 0 .  cycloSPORINE modified (NEORAL) 50 MG capsule, Take 100 capsules by mouth 2 (two) times a day., Disp: , Rfl:  .  folic acid (FOLVITE) 1 MG tablet, Take 1 mg by mouth daily. , Disp: , Rfl:  .  latanoprost (XALATAN) 0.005 % ophthalmic solution, PLACE 1 GTT INTO BOTH EYES NIGHTLY, Disp: , Rfl:  .  levothyroxine (SYNTHROID) 137 MCG tablet, TAKE 1 TABLET(137 MCG) BY MOUTH DAILY, Disp: 90 tablet, Rfl: 0 .  metFORMIN (GLUCOPHAGE) 500 MG tablet, TAKE 1 TABLET(500 MG) BY MOUTH DAILY AFTER BREAKFAST, Disp: 90 tablet, Rfl: 0 .  methotrexate (RHEUMATREX) 2.5 MG tablet, Take 25 mg by mouth every Wednesday. , Disp: , Rfl:  .  Multiple Vitamins-Minerals (MULTIVITAMIN WITH MINERALS) tablet, Take 1 tablet by mouth daily., Disp: , Rfl:  .  polyethylene glycol-electrolytes (NULYTELY/GOLYTELY) 420 g solution, , Disp: , Rfl:  .   potassium chloride SA (K-DUR,KLOR-CON) 20 MEQ tablet, Take 1 tablet (20 mEq total) by mouth 2 (two) times daily., Disp: 180 tablet, Rfl: 0 .  predniSONE (DELTASONE) 10 MG tablet, Take 40mg  x 3 days,20mg  x 3 days, 10mg  x 3 days, Disp: 21 tablet, Rfl: 0 .  timolol (TIMOPTIC) 0.5 % ophthalmic solution, Place 1 drop into both eyes 2 (two) times daily. , Disp: , Rfl: 5 .  torsemide (DEMADEX) 20 MG tablet, Take 1 tablet (20 mg total) by mouth daily., Disp: 90 tablet, Rfl: 1 .  verapamil (CALAN-SR) 240 MG CR tablet, TAKE 1 TABLET(240 MG) BY MOUTH TWICE DAILY, Disp: 180 tablet, Rfl: 1  Social History   Tobacco Use  Smoking Status Never Smoker  Smokeless Tobacco Never Used    Allergies  Allergen Reactions  . Dicloxacillin Other (See Comments)    GI upset   Objective:   Vitals:   08/27/18 1113  Temp: 97.7 F (36.5 C)   There is no height or weight on file to calculate BMI. Constitutional Well developed. Well nourished.  Vascular Dorsalis pedis pulses palpable bilaterally. Posterior tibial pulses palpable bilaterally. Capillary refill normal to all digits.  No cyanosis or clubbing noted. Pedal hair growth normal.  Neurologic Normal speech. Oriented to person, place, and time. Epicritic sensation to light touch grossly present bilaterally.  Dermatologic  Nails well groomed and normal in appearance. No open wounds. No skin lesions.  Orthopedic: Mild POP right medial calc tuber   Radiographs: None Assessment:   1. Plantar fasciitis   2. Equinus deformity of foot    Plan:  Patient was evaluated and treated and all questions answered.  Plantar fasciitis -Continue stretching and icing. Discussed injection should pain worsen. F/u as needed. -Will make appt for possible inserts.  Return if symptoms worsen or fail to improve.

## 2018-11-08 LAB — NOVEL CORONAVIRUS, NAA: SARS-CoV-2, NAA: NOT DETECTED

## 2018-11-12 ENCOUNTER — Encounter: Payer: Self-pay | Admitting: Family Medicine

## 2018-11-12 ENCOUNTER — Ambulatory Visit (INDEPENDENT_AMBULATORY_CARE_PROVIDER_SITE_OTHER): Payer: Medicare Other | Admitting: Family Medicine

## 2018-11-12 ENCOUNTER — Other Ambulatory Visit: Payer: Self-pay

## 2018-11-12 ENCOUNTER — Ambulatory Visit (HOSPITAL_COMMUNITY)
Admission: RE | Admit: 2018-11-12 | Discharge: 2018-11-12 | Disposition: A | Discharge status: Home / Self Care | Payer: Medicare Other | Source: Ambulatory Visit | Attending: Family Medicine | Admitting: Family Medicine

## 2018-11-12 VITALS — BP 128/72 | HR 88 | Temp 98.1°F | Resp 14

## 2018-11-12 DIAGNOSIS — R600 Localized edema: Secondary | ICD-10-CM | POA: Diagnosis not present

## 2018-11-12 LAB — BASIC METABOLIC PANEL WITH GFR
BUN/Creatinine Ratio: 14 (calc) (ref 6–22)
BUN: 22 mg/dL (ref 7–25)
CO2: 31 mmol/L (ref 20–32)
Calcium: 10.2 mg/dL (ref 8.6–10.4)
Chloride: 102 mmol/L (ref 98–110)
Creat: 1.57 mg/dL — ABNORMAL HIGH (ref 0.60–0.93)
GFR, Est African American: 38 mL/min/{1.73_m2} — ABNORMAL LOW (ref >=60)
GFR, Est Non African American: 33 mL/min/{1.73_m2} — ABNORMAL LOW (ref >=60)
Glucose, Bld: 86 mg/dL (ref 65–99)
Potassium: 4.8 mmol/L (ref 3.5–5.3)
Sodium: 142 mmol/L (ref 135–146)

## 2018-11-12 NOTE — Progress Notes (Signed)
Patient ID: Danielle Howard, female    DOB: Aug 19, 1950, 68 y.o.   MRN: [id number]  PCP: [name], MD  Chief Complaint  Patient presents with  . Leg Pain    x1 week- S/P fall, R leg swollen and painful    Subjective:   Danielle Howard is a 68 y.o. female, presents to clinic with CC of right leg pain and swelling s/p mechanical fall, slipped walking in kitchen, falling on linoleum and with hx of b/l knee replacements she had to scoot to her room and call someone on the phone to help her get up. Brother came over to help her.   Pain with fall was initially to outside of left knee and lower leg, was able to bear weight and walk after helped up, only had a slight limp.  Since then she's had gradual development and worsening of RLE edema.  She normally has b/l LE edema tx with diuretics, and recently had gout to right ankle with R>L LE edema and decreased right ankle ROM and increase uric acid - she did have negative DVT study at that time, tx with increased diuretics and steroids and did improve.  This LE edema is much worse, skin is tight and swollen, red and hot, extends from foot up to knee.  She is walking fine now without any limp and denies ankle pain or any right knee or hip pain, pain is generalized to edematous areas, worse with palpation.  No improvement with diuretics, elevation.  Her right leg is so swollen she can't wear any of her shoes or compression stockings.  She denies any fever chills sweats shortness of breath, chest pain, rapid heart rate, syncope  Patient Active Problem List   Diagnosis Date Noted  . Gout 10/18/2018  . Mixed hyperlipidemia 07/30/2018  . Hx of colonic polyps 05/20/2018  . Acute iritis, left eye 04/07/2018  . Macular pucker, left eye 12/29/2017  . Nuclear sclerotic cataract of right eye 12/29/2017  . Uveitis of left eye 12/29/2017  . PVD (peripheral vascular disease) ([place]) 11/24/2016  . Osteoarthritis, hand 10/01/2016  . Hypothyroidism 01/23/2016  .  Post menopausal syndrome 09/05/2013  . Prediabetes 03/07/2013  . Hashimoto's thyroiditis 01/03/2013  . Peripheral edema 01/03/2013  . Obesity 01/03/2013  . Essential hypertension, benign 01/03/2013     Prior to Admission medications   Medication Sig Start Date End Date Taking? Authorizing Provider  brimonidine (ALPHAGAN) 0.2 % ophthalmic solution Place 1 drop into the left eye 3 (three) times daily.     [provider]  Calcium Carb-Cholecalciferol (CALCIUM 600/VITAMIN D3 PO) Take 1 tablet by mouth daily.    [provider]  cholecalciferol (VITAMIN D) 1000 UNITS tablet Take 1,000 Units by mouth daily.    [provider]  cycloSPORINE modified (NEORAL) 50 MG capsule Take 100 capsules by mouth 2 (two) times a day. 09/19/18   [provider]  folic acid (FOLVITE) 1 MG tablet Take 1 mg by mouth daily.  01/25/18   [provider]  latanoprost (XALATAN) 0.005 % ophthalmic solution PLACE 1 GTT INTO BOTH EYES NIGHTLY 07/27/18   [provider]  levothyroxine (SYNTHROID) 137 MCG tablet TAKE 1 TABLET(137 MCG) BY MOUTH DAILY 09/21/18   Cassandria Anger, MD  metFORMIN (GLUCOPHAGE) 500 MG tablet TAKE 1 TABLET(500 MG) BY MOUTH DAILY AFTER BREAKFAST 09/22/18   Cassandria Anger, MD  methotrexate (RHEUMATREX) 2.5 MG tablet Take 25 mg by mouth every Wednesday.  03/09/18  [provider]  Multiple Vitamins-Minerals (MULTIVITAMIN WITH MINERALS) tablet Take 1 tablet by mouth daily.    [provider]  potassium chloride SA (K-DUR,KLOR-CON) 20 MEQ tablet Take 1 tablet (20 mEq total) by mouth 2 (two) times daily. 08/09/18   [place], Modena Nunnery, MD  timolol (TIMOPTIC) 0.5 % ophthalmic solution Place 1 drop into both eyes 2 (two) times daily.     [provider]  torsemide (DEMADEX) 20 MG tablet Take 1 tablet (20 mg total) by mouth daily. 08/09/18   [name], MD  verapamil (CALAN-SR) 240 MG CR tablet TAKE 1 TABLET(240 MG) BY  MOUTH TWICE DAILY 08/09/18   [name], MD     Allergies  Allergen Reactions  . Dicloxacillin Other (See Comments)    GI upset     Family History  Problem Relation Age of Onset  . Arthritis Mother   . Heart disease Mother   . Hypertension Mother   . Arthritis Father   . Hyperlipidemia Father   . HIV/AIDS Brother      Social History   Socioeconomic History  . Marital status: Single    Spouse name: Not on file  . Number of children: Not on file  . Years of education: Not on file  . Highest education level: Not on file  Occupational History  . Not on file  Social Needs  . Financial resource strain: Not on file  . Food insecurity    Worry: Not on file    Inability: Not on file  . Transportation needs    Medical: Not on file    Non-medical: Not on file  Tobacco Use  . Smoking status: Never Smoker  . Smokeless tobacco: Never Used  Substance and Sexual Activity  . Alcohol use: Yes    Comment: occasional  . Drug use: No  . Sexual activity: Yes  Lifestyle  . Physical activity    Days per week: Not on file    Minutes per session: Not on file  . Stress: Not on file  Relationships  . Social Herbalist on phone: Not on file    Gets together: Not on file    Attends religious service: Not on file    Active member of club or organization: Not on file    Attends meetings of clubs or organizations: Not on file    Relationship status: Not on file  . Intimate partner violence    Fear of current or ex partner: Not on file    Emotionally abused: Not on file    Physically abused: Not on file    Forced sexual activity: Not on file  Other Topics Concern  . Not on file  Social History Narrative  . Not on file     Review of Systems  Constitutional: Negative.   HENT: Negative.   Eyes: Negative.   Respiratory: Negative.   Cardiovascular: Negative.   Gastrointestinal: Negative.   Endocrine: Negative.   Genitourinary: Negative.   Musculoskeletal:  Negative.   Skin: Negative.   Allergic/Immunologic: Negative.   Neurological: Negative.   Hematological: Negative.   Psychiatric/Behavioral: Negative.   All other systems reviewed and are negative.      Objective:    Vitals:   11/12/18 1124  BP: 128/72  Pulse: 88  Resp: 14  Temp: 98.1 F (36.7 C)  TempSrc: Oral  SpO2: 98%      Physical Exam Vitals signs and nursing note reviewed.  Constitutional:  Appearance: She is well-developed.  HENT:     Head: Normocephalic and atraumatic.     Nose: Nose normal.  Eyes:     General:        Right eye: No discharge.        Left eye: No discharge.     Conjunctiva/sclera: Conjunctivae normal.  Neck:     Trachea: No tracheal deviation.  Cardiovascular:     Rate and Rhythm: Normal rate and regular rhythm.     Comments: RLE visibly larger than LLE, calf circumference on right is 49 cm, left is 44 cm Edema to LLE is mid tibia down with pedal edema, mildly pitting RLE has ttp, erythema, warmth, pretibial pitting, and very tender No right popliteal palpable cord, but severe ttp to right popliteal fossa - pt jumped and screamed Pulmonary:     Effort: Pulmonary effort is normal. No respiratory distress.     Breath sounds: No stridor.  Musculoskeletal: Normal range of motion.     Right knee: She exhibits normal range of motion, no deformity and no laceration.     Right ankle: Normal. She exhibits normal range of motion, no ecchymosis and normal pulse.     Right lower leg: She exhibits tenderness and swelling. She exhibits no bony tenderness, no deformity and no laceration. 2+ Edema present.     Left lower leg: 1+ Edema present.  Skin:    General: Skin is warm and dry.     Findings: No rash.  Neurological:     Mental Status: She is alert.     Motor: No abnormal muscle tone.     Coordination: Coordination normal.  Psychiatric:        Behavior: Behavior normal.           Assessment & Plan:      ICD-10-CM   1. Unilateral  edema of lower extremity  R60.0 US Venous Img Lower Unilateral Right    BASIC METABOLIC PANEL WITH GFR    Injury to right lower extremity with acute worsening of edema that is erythematous, hot to the touch, on exam significantly larger calf circumference than on the left, also very tender to palpation to the popliteal fossa.  Sent for DVT study (although did just recently have DVT study that was negative, however pt states this swelling is acute/new, worse, red and hot after trauma to leg). BMP obtained to assess renal function in case needing to start NOAC for DVT. No signs of abrasion, and it would be unlikely to be cellulitis since unclear where source of infection would come from, but cellulitis in differential. Dx previously with gout as well, that was isolated to one joint, which today is fairly normal with no ttp and good ROM.     Delsa Grana, PA-C 11/12/18 11:49 AM

## 2018-11-12 NOTE — Progress Notes (Signed)
Can you please call pt and notify her that the DVT study was negative, since the swelling is so severe and very tender behind the knee she needs to follow up with her ortho MD.   If swelling or redness (anything worsens over the weekend) I would go to UC or ED for eval

## 2018-11-16 DIAGNOSIS — H20042 Secondary noninfectious iridocyclitis, left eye: Secondary | ICD-10-CM | POA: Diagnosis not present

## 2018-11-16 DIAGNOSIS — H3581 Retinal edema: Secondary | ICD-10-CM | POA: Diagnosis not present

## 2018-11-16 DIAGNOSIS — H35033 Hypertensive retinopathy, bilateral: Secondary | ICD-10-CM | POA: Diagnosis not present

## 2018-11-16 DIAGNOSIS — H2511 Age-related nuclear cataract, right eye: Secondary | ICD-10-CM | POA: Diagnosis not present

## 2018-11-16 DIAGNOSIS — Z79899 Other long term (current) drug therapy: Secondary | ICD-10-CM | POA: Diagnosis not present

## 2018-11-16 DIAGNOSIS — E119 Type 2 diabetes mellitus without complications: Secondary | ICD-10-CM | POA: Diagnosis not present

## 2018-11-16 DIAGNOSIS — H35372 Puckering of macula, left eye: Secondary | ICD-10-CM | POA: Diagnosis not present

## 2018-11-16 DIAGNOSIS — Z961 Presence of intraocular lens: Secondary | ICD-10-CM | POA: Diagnosis not present

## 2018-11-17 ENCOUNTER — Ambulatory Visit (INDEPENDENT_AMBULATORY_CARE_PROVIDER_SITE_OTHER): Payer: Medicare Other | Admitting: Family Medicine

## 2018-11-17 ENCOUNTER — Encounter: Payer: Self-pay | Admitting: Family Medicine

## 2018-11-17 ENCOUNTER — Other Ambulatory Visit: Payer: Self-pay

## 2018-11-17 VITALS — BP 138/78 | HR 92 | Temp 97.9°F | Resp 14 | Ht 66.0 in | Wt 235.0 lb

## 2018-11-17 DIAGNOSIS — R609 Edema, unspecified: Secondary | ICD-10-CM | POA: Diagnosis not present

## 2018-11-17 DIAGNOSIS — N179 Acute kidney failure, unspecified: Secondary | ICD-10-CM

## 2018-11-17 DIAGNOSIS — I739 Peripheral vascular disease, unspecified: Secondary | ICD-10-CM | POA: Diagnosis not present

## 2018-11-17 NOTE — Progress Notes (Signed)
   Subjective:    Patient ID: Danielle Howard, female    DOB: 09/10/1950, 68 y.o.   MRN: [id number]  Patient presents for Follow-up  Patient here for interim follow-up on her renal function.  Creatinine peaked to 1.57 diuretic was decreased CR  was 1.34 a month ago. She has been seen for an interim visit last week secondary to bilateral edema lower extremities.  She did have ultrasound done there was no DVT. He also had a fall before the swelling, she slipped on throw rugs  Initially diuretic increased to BID but with ARF, decreased back to daily  Still has swelling but no pain from the fall Review Of Systems:  GEN- denies fatigue, fever, weight loss,weakness, recent illness HEENT- denies eye drainage, change in vision, nasal discharge, CVS- denies chest pain, palpitations RESP- denies SOB, cough, wheeze ABD- denies N/V, change in stools, abd pain GU- denies dysuria, hematuria, dribbling, incontinence MSK- + joint pain, muscle aches, injury Neuro- denies headache, dizziness, syncope, seizure activity       Objective:    BP 138/78   Pulse 92   Temp 97.9 F (36.6 C) (Oral)   Resp 14   Ht 5\' 6"  (1.676 m)   Wt 235 lb (106.6 kg)   SpO2 99%   BMI 37.93 kg/m  GEN- NAD, alert and oriented x3 HEENT- PERRL, EOMI, non injected sclera, pink conjunctiva, MMM, oropharynx clear CVS- RRR, no murmur RESP-CTAB ABD-NABS,soft,NT,ND EXT- 2+ edema  R > L Pulses- Radial,2+ DP- diminished with swelling       Assessment & Plan:      Problem List Items Addressed This Visit      Unprioritized   Peripheral edema - Primary    Recommended unna boots but pt declined, stated she need to take a shower daily Will instead wrap with ACE wraps during day since she cant get her compression hose on Recheck renal function Recheck in 1 week      Relevant Orders   Basic metabolic panel   Microalbumin / creatinine urine ratio   PVD (peripheral vascular disease) ([place])    Other Visit Diagnoses    Acute renal failure, unspecified acute renal failure type ([place])       Relevant Orders   Basic metabolic panel   Microalbumin / creatinine urine ratio      Note: This dictation was prepared with Dragon dictation along with smaller phrase technology. Any transcriptional errors that result from this process are unintentional.

## 2018-11-17 NOTE — Assessment & Plan Note (Signed)
Recommended unna boots but pt declined, stated she need to take a shower daily Will instead wrap with ACE wraps during day since she cant get her compression hose on Recheck renal function Recheck in 1 week

## 2018-11-17 NOTE — Patient Instructions (Addendum)
F/U next Wed Wear compression hose  Cancel appt for August 7th

## 2018-11-18 LAB — BASIC METABOLIC PANEL WITH GFR
BUN/Creatinine Ratio: 11 (calc) (ref 6–22)
BUN: 18 mg/dL (ref 7–25)
CO2: 30 mmol/L (ref 20–32)
Calcium: 9.6 mg/dL (ref 8.6–10.4)
Chloride: 103 mmol/L (ref 98–110)
Creat: 1.59 mg/dL — ABNORMAL HIGH (ref 0.60–0.93)
Glucose, Bld: 83 mg/dL (ref 65–99)
Potassium: 4.7 mmol/L (ref 3.5–5.3)
Sodium: 141 mmol/L (ref 135–146)

## 2018-11-18 LAB — MICROALBUMIN / CREATININE URINE RATIO
Creatinine, Urine: 67 mg/dL (ref 20–275)
Microalb Creat Ratio: 16 mcg/mg creat (ref <30)
Microalb, Ur: 1.1 mg/dL

## 2018-11-22 DIAGNOSIS — E890 Postprocedural hypothyroidism: Secondary | ICD-10-CM | POA: Diagnosis not present

## 2018-11-22 LAB — T4, FREE: Free T4: 1.5 ng/dL (ref 0.8–1.8)

## 2018-11-22 LAB — TSH: TSH: 0.12 mIU/L — ABNORMAL LOW (ref 0.40–4.50)

## 2018-11-23 ENCOUNTER — Other Ambulatory Visit: Payer: Self-pay

## 2018-11-24 ENCOUNTER — Encounter: Payer: Self-pay | Admitting: Family Medicine

## 2018-11-24 ENCOUNTER — Ambulatory Visit (INDEPENDENT_AMBULATORY_CARE_PROVIDER_SITE_OTHER): Payer: Medicare Other | Admitting: Family Medicine

## 2018-11-24 VITALS — BP 136/68 | HR 90 | Temp 98.6°F | Resp 14 | Ht 66.0 in | Wt 235.0 lb

## 2018-11-24 DIAGNOSIS — R600 Localized edema: Secondary | ICD-10-CM

## 2018-11-24 DIAGNOSIS — R609 Edema, unspecified: Secondary | ICD-10-CM | POA: Diagnosis not present

## 2018-11-24 DIAGNOSIS — N179 Acute kidney failure, unspecified: Secondary | ICD-10-CM

## 2018-11-24 NOTE — Progress Notes (Signed)
   Subjective:    Patient ID: Danielle Howard, female    DOB: 1951-04-10, 68 y.o.   MRN: [id number]  Patient presents for Follow-up (edema- BLE- states that she has used compression hose on R foot, but not L foot)  Pt here for intermin follow up on her bilat edema  She used the ACE wraps helped some, thinks she can get her lower compression hose on, but she would like the thigh pair She is keepingup with fluids, repeat CR was  1.59 which was consistent with 1 month ago, has not changed  No new concerns  Reviewed her chart from time she went to vascular back in 2018 she has had swelling in legs for almost 10 years, they did not recommend any other intervention that could be done, just compression and dose duiretic      Review Of Systems:  GEN- denies fatigue, fever, weight loss,weakness, recent illness HEENT- denies eye drainage, change in vision, nasal discharge, CVS- denies chest pain, palpitations RESP- denies SOB, cough, wheeze MSK- denies joint pain, muscle aches, injury Neuro- denies headache, dizziness, syncope, seizure activity       Objective:    BP 136/68   Pulse 90   Temp 98.6 F (37 C) (Oral)   Resp 14   Ht 5\' 6"  (1.676 m)   Wt 235 lb (106.6 kg)   SpO2 97%   BMI 37.93 kg/m  GEN- NAD, alert and oriented x3 CVS- RRR, no murmur RESP-CTAB EXT- 1+ edema  R > L at ankle Pulses- Radial,2+ DP- diminished with swelling         Assessment & Plan:      Problem List Items Addressed This Visit      Unprioritized   Peripheral edema - Primary    Edema more of her baseline now Return to compression hose Given script for new hose  Recheck renal function in 1 month given lab slip       Other Visit Diagnoses    Acute renal failure, unspecified acute renal failure type [facility])       Relevant Orders   Basic metabolic panel      Note: This dictation was prepared with Dragon dictation along with smaller phrase technology. Any transcriptional errors that result from  this process are unintentional.

## 2018-11-24 NOTE — Patient Instructions (Addendum)
Get labs done in 1 month for kidneys in [place] F/U 4 months

## 2018-11-25 ENCOUNTER — Encounter: Payer: Self-pay | Admitting: Family Medicine

## 2018-11-25 NOTE — Assessment & Plan Note (Signed)
Edema more of her baseline now Return to compression hose Given script for new hose  Recheck renal function in 1 month given lab slip

## 2018-11-29 ENCOUNTER — Ambulatory Visit (INDEPENDENT_AMBULATORY_CARE_PROVIDER_SITE_OTHER): Payer: Medicare Other | Admitting: "Endocrinology

## 2018-11-29 ENCOUNTER — Encounter: Payer: Self-pay | Admitting: "Endocrinology

## 2018-11-29 ENCOUNTER — Other Ambulatory Visit: Payer: Self-pay

## 2018-11-29 DIAGNOSIS — E890 Postprocedural hypothyroidism: Secondary | ICD-10-CM | POA: Diagnosis not present

## 2018-11-29 DIAGNOSIS — R7303 Prediabetes: Secondary | ICD-10-CM | POA: Diagnosis not present

## 2018-11-29 MED ORDER — LEVOTHYROXINE SODIUM 137 MCG PO TABS: 137.0000 ug | ORAL_TABLET | Freq: Every day | ORAL | 1 refills | Status: DC

## 2018-11-29 NOTE — Progress Notes (Signed)
11/29/2018                                Endocrinology Telehealth Visit Follow up Note -During COVID -19 Pandemic  I connected with Danielle Howard on 11/29/2018   by telephone and verified that I am speaking with the correct person using two identifiers. Danielle Howard, 17-May-1950. she has verbally consented to this visit. All issues noted in this document were discussed and addressed. The format was not optimal for physical exam.   Subjective:    Patient ID: Danielle Howard, female    DOB: 07/03/1950, PCP Danielle Rossetti, MD   Past Medical History:  Diagnosis Date  . Arthritis   . Complication of anesthesia    nausea  . Diabetes mellitus without complication ([place])   . Headache   . Hypertension   . Hypothyroidism   . Hypothyroidism   . Iritis   . Thyroid disease    Past Surgical History:  Procedure Laterality Date  . ABDOMINAL HYSTERECTOMY    . CATARACT EXTRACTION Left 2019  . CHOLECYSTECTOMY     02/2012  . COLONOSCOPY N/A 05/25/2013   Procedure: COLONOSCOPY;  Surgeon: Danielle Houston, MD;  [facility];  Service: Endoscopy;  Laterality: N/A;  830-moved to 1055 Ann to notify pt  . COLONOSCOPY N/A 06/21/2018   Procedure: COLONOSCOPY;  Surgeon: Danielle Houston, MD;  Location: [facility];  Service: Endoscopy;  Laterality: N/A;  1020  . JOINT REPLACEMENT Bilateral    2012,2013-bilateral knees  . POLYPECTOMY  06/21/2018   Procedure: POLYPECTOMY;  Surgeon: Danielle Houston, MD;  Location: [facility];  Service: Endoscopy;;  . THYROID SURGERY     Social History   Socioeconomic History  . Marital status: Single    Spouse name: Not on file  . Number of children: Not on file  . Years of education: Not on file  . Highest education level: Not on file  Occupational History  . Not on file  Social Needs  . Financial resource strain: Not on file  . Food insecurity    Worry: Not on file    Inability: Not on file  . Transportation needs    Medical: Not on file   Non-medical: Not on file  Tobacco Use  . Smoking status: Never Smoker  . Smokeless tobacco: Never Used  Substance and Sexual Activity  . Alcohol use: Yes    Comment: occasional  . Drug use: No  . Sexual activity: Yes  Lifestyle  . Physical activity    Days per week: Not on file    Minutes per session: Not on file  . Stress: Not on file  Relationships  . Social Herbalist on phone: Not on file    Gets together: Not on file    Attends religious service: Not on file    Active member of club or organization: Not on file    Attends meetings of clubs or organizations: Not on file    Relationship status: Not on file  Other Topics Concern  . Not on file  Social History Narrative  . Not on file   Outpatient Encounter Medications as of 11/29/2018  Medication Sig  . brimonidine (ALPHAGAN) 0.2 % ophthalmic solution Place 1 drop into the left eye 3 (three) times daily.   . Calcium Carb-Cholecalciferol (CALCIUM 600/VITAMIN D3 PO) Take 1 tablet by mouth daily.  Marland Kitchen  cholecalciferol (VITAMIN D) 1000 UNITS tablet Take 1,000 Units by mouth daily.  . cycloSPORINE modified (NEORAL) 50 MG capsule Take by mouth. Take (2) caps PO Q AM and (1) cap PO Q PM  . folic acid (FOLVITE) 1 MG tablet Take 1 mg by mouth daily.   Marland Kitchen latanoprost (XALATAN) 0.005 % ophthalmic solution PLACE 1 GTT INTO BOTH EYES NIGHTLY  . levothyroxine (SYNTHROID) 137 MCG tablet Take 1 tablet (137 mcg total) by mouth daily before breakfast.  . methotrexate (RHEUMATREX) 2.5 MG tablet Take 25 mg by mouth every Wednesday.   . Multiple Vitamins-Minerals (MULTIVITAMIN WITH MINERALS) tablet Take 1 tablet by mouth daily.  . potassium chloride SA (K-DUR,KLOR-CON) 20 MEQ tablet Take 1 tablet (20 mEq total) by mouth 2 (two) times daily.  . timolol (TIMOPTIC) 0.5 % ophthalmic solution Place 1 drop into both eyes 2 (two) times daily.   Marland Kitchen torsemide (DEMADEX) 20 MG tablet Take 1 tablet (20 mg total) by mouth daily.  . verapamil (CALAN-SR)  240 MG CR tablet TAKE 1 TABLET(240 MG) BY MOUTH TWICE DAILY  . [DISCONTINUED] levothyroxine (SYNTHROID) 137 MCG tablet TAKE 1 TABLET(137 MCG) BY MOUTH DAILY  . [DISCONTINUED] metFORMIN (GLUCOPHAGE) 500 MG tablet TAKE 1 TABLET(500 MG) BY MOUTH DAILY AFTER BREAKFAST   No facility-administered encounter medications on file as of 11/29/2018.    ALLERGIES: Allergies  Allergen Reactions  . Dicloxacillin Other (See Comments)    GI upset   VACCINATION STATUS: Immunization History  Administered Date(s) Administered  . Influenza Whole 02/09/2013  . Influenza, High Dose Seasonal PF 01/20/2017, 01/27/2018  . Influenza,inj,Quad PF,6+ Mos 01/15/2015, 01/25/2016  . Influenza-Unspecified 01/13/2014  . Pneumococcal Conjugate-13 01/15/2015  . Pneumococcal Polysaccharide-23 01/13/2014  . Tdap 10/02/2009  . Varicella 03/03/2010    HPI   Danielle Howard is a 68 year old female patient with medical history as above.  She is engaged in telephone visit this morning for follow-up of her  postsurgical hypothyroidism, prediabetes, hyperlipidemia.    She is on levothyroxine 137 mcg po qam. she reports compliance to his medication. She has no new complaints.  -  She is consistent taking her metformin, no side effects reported.  However, her most recent lab work reveals acute renal insufficiency.  She is scheduled to consult with a nephrologist.  she has family hx of diabetes in her mother. she denies dysphagia, SOB, nor voice change.    Objective:    There were no vitals taken for this visit.  Wt Readings from Last 3 Encounters:  11/24/18 235 lb (106.6 kg)  11/17/18 235 lb (106.6 kg)  10/18/18 234 lb 6.4 oz (106.3 kg)      Recent Results (from the past 2160 hour(s))  CBC with Differential/Platelet     Status: Abnormal   Collection Time: 10/12/18 12:12 PM  Result Value Ref Range   WBC 6.0 3.8 - 10.8 Thousand/uL   RBC 4.15 3.80 - 5.10 Million/uL   Hemoglobin 11.9 11.7 - 15.5 g/dL   HCT 36.6 35.0 -  45.0 %   MCV 88.2 80.0 - 100.0 fL   MCH 28.7 27.0 - 33.0 pg   MCHC 32.5 32.0 - 36.0 g/dL   RDW 14.5 11.0 - 15.0 %   Platelets 457 (H) 140 - 400 Thousand/uL   MPV 10.0 7.5 - 12.5 fL   Neutro Abs 3,372 1,500 - 7,800 cells/uL   Lymphs Abs 1,890 850 - 3,900 cells/uL   Absolute Monocytes 588 200 - 950 cells/uL   Eosinophils Absolute 120 15 -  500 cells/uL   Basophils Absolute 30 0 - 200 cells/uL   Neutrophils Relative % 56.2 %   Total Lymphocyte 31.5 %   Monocytes Relative 9.8 %   Eosinophils Relative 2.0 %   Basophils Relative 0.5 %  Basic metabolic panel     Status: Abnormal   Collection Time: 10/12/18 12:12 PM  Result Value Ref Range   Glucose, Bld 83 65 - 99 mg/dL    Comment: .            Fasting reference interval .    BUN 16 7 - 25 mg/dL   Creat 1.34 (H) 0.50 - 0.99 mg/dL    Comment: For patients >50 years of age, the reference limit for Creatinine is approximately 13% higher for people identified as African-American. .    BUN/Creatinine Ratio 12 6 - 22 (calc)   Sodium 140 135 - 146 mmol/L   Potassium 4.8 3.5 - 5.3 mmol/L   Chloride 101 98 - 110 mmol/L   CO2 28 20 - 32 mmol/L   Calcium 9.8 8.6 - 10.4 mg/dL  D-dimer, quantitative (not at [facility])     Status: Abnormal   Collection Time: 10/12/18 12:12 PM  Result Value Ref Range   D-Dimer, Quant 0.77 (H) <0.50 mcg/mL FEU    Comment: . The D-Dimer test is used frequently to exclude an acute PE or DVT. In patients with a low to moderate clinical risk assessment and a D-Dimer result <0.50 mcg/mL FEU, the likelihood of a PE or DVT is very low. However, a thromboembolic event should not be excluded solely on the basis of the D-Dimer level. Increased levels of D-Dimer are associated with a PE, DVT, DIC, malignancies, inflammation, sepsis, surgery, trauma, pregnancy, and advancing patient age. [Jama 2006 11:295(2):199-207] . For additional information, please refer to: http://education.quest[employer].com/faq/FAQ149 (This  link is being provided for informational/ educational purposes only) .   Uric Acid     Status: Abnormal   Collection Time: 10/12/18 12:12 PM  Result Value Ref Range   Uric Acid, Serum 8.1 (H) 2.5 - 7.0 mg/dL    Comment: Therapeutic target for gout patients: <6.0 mg/dL .   Basic metabolic panel     Status: Abnormal   Collection Time: 10/18/18  9:32 AM  Result Value Ref Range   Glucose, Bld 83 65 - 99 mg/dL    Comment: .            Fasting reference interval .    BUN 27 (H) 7 - 25 mg/dL   Creat 1.34 (H) 0.50 - 0.99 mg/dL    Comment: For patients >45 years of age, the reference limit for Creatinine is approximately 13% higher for people identified as African-American. .    BUN/Creatinine Ratio 20 6 - 22 (calc)   Sodium 141 135 - 146 mmol/L   Potassium 4.4 3.5 - 5.3 mmol/L   Chloride 101 98 - 110 mmol/L   CO2 28 20 - 32 mmol/L   Calcium 10.4 8.6 - 10.4 mg/dL  Novel Coronavirus, NAA ([facility])     Status: None   Collection Time: 11/02/18 12:06 PM  Result Value Ref Range   SARS-CoV-2, NAA Not Detected Not Detected    Comment: Testing was performed using the cobas(R) SARS-CoV-2 test. This test was developed and its performance characteristics determined by [employer]. This test has not been FDA cleared or approved. This test has been authorized by FDA under an Emergency Use Authorization (EUA). This test is only authorized for the  duration of time the declaration that circumstances exist justifying the authorization of the emergency use of in vitro diagnostic tests for detection of SARS-CoV-2 virus and/or diagnosis of COVID-19 infection under section 564(b)(1) of the Act, 21 U.S.C. 193XTK-2(I)(0), unless the authorization is terminated or revoked sooner. When diagnostic testing is negative, the possibility of a false negative result should be considered in the context of a patient's recent exposures and the presence of clinical signs and symptoms consistent with  COVID-19. An individual without symptoms of COVID-19 and who is not shedding SARS-CoV-2 virus would expect to have a negati ve (not detected) result in this assay.   BASIC METABOLIC PANEL WITH GFR     Status: Abnormal   Collection Time: 11/12/18 12:11 PM  Result Value Ref Range   Glucose, Bld 86 65 - 99 mg/dL    Comment: .            Fasting reference interval .    BUN 22 7 - 25 mg/dL   Creat 1.57 (H) 0.60 - 0.93 mg/dL    Comment: For patients >103 years of age, the reference limit for Creatinine is approximately 13% higher for people identified as African-American. .    GFR, Est Non African American 33 (L) > OR = 60 mL/min/1.51m2   GFR, Est African American 38 (L) > OR = 60 mL/min/1.83m2   BUN/Creatinine Ratio 14 6 - 22 (calc)   Sodium 142 135 - 146 mmol/L   Potassium 4.8 3.5 - 5.3 mmol/L   Chloride 102 98 - 110 mmol/L   CO2 31 20 - 32 mmol/L   Calcium 10.2 8.6 - 10.4 mg/dL  Basic metabolic panel     Status: Abnormal   Collection Time: 11/17/18 11:37 AM  Result Value Ref Range   Glucose, Bld 83 65 - 99 mg/dL    Comment: .            Fasting reference interval .    BUN 18 7 - 25 mg/dL   Creat 1.59 (H) 0.60 - 0.93 mg/dL    Comment: For patients >45 years of age, the reference limit for Creatinine is approximately 13% higher for people identified as African-American. .    BUN/Creatinine Ratio 11 6 - 22 (calc)   Sodium 141 135 - 146 mmol/L   Potassium 4.7 3.5 - 5.3 mmol/L   Chloride 103 98 - 110 mmol/L   CO2 30 20 - 32 mmol/L   Calcium 9.6 8.6 - 10.4 mg/dL  Microalbumin / creatinine urine ratio     Status: None   Collection Time: 11/17/18 11:37 AM  Result Value Ref Range   Creatinine, Urine 67 20 - 275 mg/dL   Microalb, Ur 1.1 mg/dL    Comment: Reference Range Not established    Microalb Creat Ratio 16 <30 mcg/mg creat    Comment: . The ADA defines abnormalities in albumin excretion as follows: Marland Kitchen Category         Result (mcg/mg creatinine) . Normal                     <30 Microalbuminuria         30-299  Clinical albuminuria   > OR = 300 . The ADA recommends that at least two of three specimens collected within a 3-6 month period be abnormal before considering a patient to be within a diagnostic category.   TSH     Status: Abnormal   Collection Time: 11/22/18  9:19 AM  Result  Value Ref Range   TSH 0.12 (L) 0.40 - 4.50 mIU/L  T4, free     Status: None   Collection Time: 11/22/18  9:19 AM  Result Value Ref Range   Free T4 1.5 0.8 - 1.8 ng/dL     CMP     Component Value Date/Time   NA 141 11/17/2018 1137   K 4.7 11/17/2018 1137   CL 103 11/17/2018 1137   CO2 30 11/17/2018 1137   GLUCOSE 83 11/17/2018 1137   BUN 18 11/17/2018 1137   CREATININE 1.59 (H) 11/17/2018 1137   CALCIUM 9.6 11/17/2018 1137   PROT 6.9 07/23/2018 0757   ALBUMIN 3.8 07/15/2016 0859   AST 15 07/23/2018 0757   ALT 9 07/23/2018 0757   ALKPHOS 90 07/15/2016 0859   BILITOT 0.4 07/23/2018 0757   GFRNONAA 33 (L) 11/12/2018 1211   GFRAA 38 (L) 11/12/2018 1211     Diabetic Labs (most recent): Lab Results  Component Value Date   HGBA1C 5.4 07/23/2018   HGBA1C 5.7 (H) 07/15/2017   HGBA1C 5.5 07/15/2016     Lipid Panel ( most recent) Lipid Panel     Component Value Date/Time   CHOL 184 07/23/2018 0757   TRIG 75 07/23/2018 0757   HDL 73 07/23/2018 0757   CHOLHDL 2.5 07/23/2018 0757   VLDL 17 07/15/2016 0859   LDLCALC 95 07/23/2018 0757       Assessment & Plan:   1. Hypothyroidism: 2. Hashimoto's thyroiditis 3. Left sided hemithyroidectomy  Her previsit thyroid function tests are consistent with appropriate replacement.  She is advised to continue  levothyroxine 137 mcg p.o. every morning.    - We discussed about the correct intake of her thyroid hormone, on empty stomach at fasting, with water, separated by at least 30 minutes from breakfast and other medications,  and separated by more than 4 hours from calcium, iron, multivitamins, acid reflux  medications (PPIs). -Patient is made aware of the fact that thyroid hormone replacement is needed for life, dose to be adjusted by periodic monitoring of thyroid function tests.   Her last thyroid u/s showed normal size right lobe with no nodules.   Pre-diabetes -Her recent A1c was 5.4% still consistent with prediabetes.  She is advised to discontinue metformin in the face of acute renal sufficiency.  She is encouraged to keep her appointment with a nephrologist.  She is advised to avoid any over-the-counter pain medications.  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 Surgically absent left lobe of the thyroid.   - I advised patient to maintain close follow up with Danielle Rossetti, MD for primary care needs.   Time for this visit: 15 minutes. Danielle Howard  participated in the discussions, expressed understanding, and voiced agreement with the above plans.  All questions were answered to her satisfaction. she is encouraged to contact clinic should she have any questions or concerns prior to her return visit.  Follow up plan: Return in about 4 months (around 04/01/2019) for Follow up with Pre-visit Labs.  Glade Lloyd, MD Phone: [phone]  Fax: [phone]  This note was partially dictated with voice recognition software. Similar sounding words can be transcribed inadequately or may not  be corrected upon review.  11/29/2018, 1:13 PM

## 2018-12-10 ENCOUNTER — Ambulatory Visit: Payer: Medicare Other | Admitting: Family Medicine

## 2018-12-21 ENCOUNTER — Other Ambulatory Visit: Payer: Self-pay | Admitting: "Endocrinology

## 2018-12-21 ENCOUNTER — Other Ambulatory Visit: Payer: Self-pay | Admitting: Family Medicine

## 2018-12-21 DIAGNOSIS — N179 Acute kidney failure, unspecified: Secondary | ICD-10-CM | POA: Diagnosis not present

## 2018-12-21 LAB — BASIC METABOLIC PANEL
BUN/Creatinine Ratio: 14 (calc) (ref 6–22)
BUN: 17 mg/dL (ref 7–25)
CO2: 26 mmol/L (ref 20–32)
Calcium: 9.7 mg/dL (ref 8.6–10.4)
Chloride: 105 mmol/L (ref 98–110)
Creat: 1.21 mg/dL — ABNORMAL HIGH (ref 0.60–0.93)
Glucose, Bld: 88 mg/dL (ref 65–99)
Potassium: 4.3 mmol/L (ref 3.5–5.3)
Sodium: 144 mmol/L (ref 135–146)

## 2019-01-14 DIAGNOSIS — Z23 Encounter for immunization: Secondary | ICD-10-CM | POA: Diagnosis not present

## 2019-01-25 DIAGNOSIS — H2511 Age-related nuclear cataract, right eye: Secondary | ICD-10-CM | POA: Diagnosis not present

## 2019-01-25 DIAGNOSIS — Z79899 Other long term (current) drug therapy: Secondary | ICD-10-CM | POA: Diagnosis not present

## 2019-01-25 DIAGNOSIS — H20042 Secondary noninfectious iridocyclitis, left eye: Secondary | ICD-10-CM | POA: Diagnosis not present

## 2019-01-25 DIAGNOSIS — H35033 Hypertensive retinopathy, bilateral: Secondary | ICD-10-CM | POA: Diagnosis not present

## 2019-01-25 DIAGNOSIS — H35372 Puckering of macula, left eye: Secondary | ICD-10-CM | POA: Diagnosis not present

## 2019-01-25 DIAGNOSIS — E119 Type 2 diabetes mellitus without complications: Secondary | ICD-10-CM | POA: Diagnosis not present

## 2019-01-25 DIAGNOSIS — Z961 Presence of intraocular lens: Secondary | ICD-10-CM | POA: Diagnosis not present

## 2019-01-28 DIAGNOSIS — Z79899 Other long term (current) drug therapy: Secondary | ICD-10-CM | POA: Diagnosis not present

## 2019-01-28 DIAGNOSIS — M25562 Pain in left knee: Secondary | ICD-10-CM | POA: Diagnosis not present

## 2019-01-28 DIAGNOSIS — M25561 Pain in right knee: Secondary | ICD-10-CM | POA: Diagnosis not present

## 2019-01-28 DIAGNOSIS — H20042 Secondary noninfectious iridocyclitis, left eye: Secondary | ICD-10-CM | POA: Diagnosis not present

## 2019-03-11 DIAGNOSIS — T8484XA Pain due to internal orthopedic prosthetic devices, implants and grafts, initial encounter: Secondary | ICD-10-CM | POA: Diagnosis not present

## 2019-03-25 ENCOUNTER — Other Ambulatory Visit: Payer: Self-pay | Admitting: *Deleted

## 2019-03-25 MED ORDER — VERAPAMIL HCL ER 240 MG PO TBCR: EXTENDED_RELEASE_TABLET | ORAL | 1 refills | Status: DC

## 2019-03-28 ENCOUNTER — Other Ambulatory Visit: Payer: Self-pay

## 2019-03-28 DIAGNOSIS — E890 Postprocedural hypothyroidism: Secondary | ICD-10-CM | POA: Diagnosis not present

## 2019-03-28 DIAGNOSIS — R7303 Prediabetes: Secondary | ICD-10-CM | POA: Diagnosis not present

## 2019-03-29 ENCOUNTER — Ambulatory Visit: Payer: Medicare Other | Admitting: Family Medicine

## 2019-03-29 LAB — COMPLETE METABOLIC PANEL WITH GFR
AG Ratio: 1.4 (calc) (ref 1.0–2.5)
ALT: 13 U/L (ref 6–29)
AST: 21 U/L (ref 10–35)
Albumin: 4.2 g/dL (ref 3.6–5.1)
Alkaline phosphatase (APISO): 109 U/L (ref 37–153)
BUN/Creatinine Ratio: 13 (calc) (ref 6–22)
BUN: 17 mg/dL (ref 7–25)
CO2: 28 mmol/L (ref 20–32)
Calcium: 9.5 mg/dL (ref 8.6–10.4)
Chloride: 106 mmol/L (ref 98–110)
Creat: 1.31 mg/dL — ABNORMAL HIGH (ref 0.60–0.93)
GFR, Est African American: 48 mL/min/{1.73_m2} — ABNORMAL LOW (ref >=60)
GFR, Est Non African American: 41 mL/min/{1.73_m2} — ABNORMAL LOW (ref >=60)
Globulin: 2.9 g/dL (calc) (ref 1.9–3.7)
Glucose, Bld: 93 mg/dL (ref 65–99)
Potassium: 4.7 mmol/L (ref 3.5–5.3)
Sodium: 144 mmol/L (ref 135–146)
Total Bilirubin: 1.1 mg/dL (ref 0.2–1.2)
Total Protein: 7.1 g/dL (ref 6.1–8.1)

## 2019-03-29 LAB — T4, FREE: Free T4: 1.3 ng/dL (ref 0.8–1.8)

## 2019-03-29 LAB — HEMOGLOBIN A1C
Hgb A1c MFr Bld: 5.6 % of total Hgb (ref <5.7)
Mean Plasma Glucose: 114 (calc)
eAG (mmol/L): 6.3 (calc)

## 2019-03-29 LAB — TSH: TSH: 2.61 mIU/L (ref 0.40–4.50)

## 2019-04-04 ENCOUNTER — Encounter: Payer: Self-pay | Admitting: Family Medicine

## 2019-04-04 ENCOUNTER — Other Ambulatory Visit: Payer: Self-pay

## 2019-04-04 ENCOUNTER — Ambulatory Visit (INDEPENDENT_AMBULATORY_CARE_PROVIDER_SITE_OTHER): Payer: Medicare Other | Admitting: Family Medicine

## 2019-04-04 VITALS — BP 128/66 | HR 72 | Temp 97.9°F | Resp 14 | Ht 66.0 in | Wt 242.0 lb

## 2019-04-04 DIAGNOSIS — Z6839 Body mass index (BMI) 39.0-39.9, adult: Secondary | ICD-10-CM | POA: Diagnosis not present

## 2019-04-04 DIAGNOSIS — I739 Peripheral vascular disease, unspecified: Secondary | ICD-10-CM | POA: Diagnosis not present

## 2019-04-04 DIAGNOSIS — N1832 Chronic kidney disease, stage 3b: Secondary | ICD-10-CM

## 2019-04-04 DIAGNOSIS — N183 Chronic kidney disease, stage 3 unspecified: Secondary | ICD-10-CM | POA: Insufficient documentation

## 2019-04-04 DIAGNOSIS — I10 Essential (primary) hypertension: Secondary | ICD-10-CM

## 2019-04-04 MED ORDER — TORSEMIDE 20 MG PO TABS: ORAL_TABLET | ORAL | 2 refills | Status: DC

## 2019-04-04 MED ORDER — POTASSIUM CHLORIDE CRYS ER 20 MEQ PO TBCR: EXTENDED_RELEASE_TABLET | ORAL | 3 refills | Status: DC

## 2019-04-04 NOTE — Assessment & Plan Note (Signed)
Viewed recent labs.

## 2019-04-04 NOTE — Assessment & Plan Note (Signed)
Blood pressure is well-controlled no change in medication 

## 2019-04-04 NOTE — Progress Notes (Signed)
   Subjective:    Patient ID: Danielle Howard, female    DOB: [birth year], 68 y.o.   MRN: [id number]  Patient presents for Follow-up (is not fasting)    Pt here to f/u chronic medical problems  Medications reviewed    HTN- taking verapamil as prescribed, no side effects with the medications Weight up7lbs since July   Chronic edema- takes demadex as needed , she still has 2+, she is unale to get her compression hose,  For the past few weeks.  States that her skin is turning dark.  She is already seeing vascular the past couple years she had an ultrasound for DVT few months back when the swelling worsen.  She has a follow-up appointment with orthopedic surgery this week. Has not been able to exercise or walk because of the discomfort and tightness in her legs.   Followed by Dr. [place] Crane ophthalmology- MTX and folic acid, cycloproin, drops, has Drops- Iritis, Hypertnsive retinopathy, F/U in 1 month   Glucose intolerance- last A1C 5.6 reviewed her recent labs   CKD- GFR 40, she is currently on mobic as needed      Review Of Systems:  GEN- denies fatigue, fever, weight loss,weakness, recent illness HEENT- denies eye drainage, change in vision, nasal discharge, CVS- denies chest pain, palpitations RESP- denies SOB, cough, wheeze ABD- denies N/V, change in stools, abd pain GU- denies dysuria, hematuria, dribbling, incontinence MSK- + joint pain, muscle aches, injury Neuro- denies headache, dizziness, syncope, seizure activity       Objective:    BP 128/66   Pulse 72   Temp 97.9 F (36.6 C) (Temporal)   Resp 14   Ht 5\' 6"  (1.676 m)   Wt 242 lb (109.8 kg)   SpO2 99%   BMI 39.06 kg/m  GEN- NAD, alert and oriented x3 HEENT- PERRL, EOMI, non injected sclera, pink conjunctiva, MMM, oropharynx clear, TM clear bilateral no effusion Neck- Supple, no thyromegaly CVS- RRR, no murmur RESP-CTAB ABD-NABS,soft,NT,ND EXT-2+ edema with chronic venous stasis changes Pulses- Radial,  DP-elevated        Assessment & Plan:      Problem List Items Addressed This Visit      Unprioritized   Chronic kidney disease (CKD), stage III (moderate)    Viewed recent labs.      Essential hypertension, benign    Blood pressure is well controlled no change in medication.      Relevant Medications   torsemide (DEMADEX) 20 MG tablet   Obesity    I do think some of her weight gain is secondary to the extra fluid she has on the lower extremities.       PVD (peripheral vascular disease) ([place]) - Primary    No peripheral vascular disease with chronic venous stasis.  We will increase her torsemide to 40 mg daily for the next 3 days along with her potassium.  She needs to hydrate along with this.  My concern about keeping her on the higher dose is her chronic kidney disease and her age.  She does not tolerate her compression hose at this time she cannot get on Ace wraps we also discussed Unna boot she is not willing to use these.      Relevant Medications   torsemide (DEMADEX) 20 MG tablet      Note: This dictation was prepared with Dragon dictation along with smaller phrase technology. Any transcriptional errors that result from this process are unintentional.

## 2019-04-04 NOTE — Patient Instructions (Addendum)
Increase demadex to 40mg  for the next 3 days, then put on the compression hose  Elevate your feet  F/u 4 MONTHS for Physical

## 2019-04-04 NOTE — Assessment & Plan Note (Signed)
No peripheral vascular disease with chronic venous stasis.  We will increase her torsemide to 40 mg daily for the next 3 days along with her potassium.  She needs to hydrate along with this.  My concern about keeping her on the higher dose is her chronic kidney disease and her age.  She does not tolerate her compression hose at this time she cannot get on Ace wraps we also discussed Unna boot she is not willing to use these.

## 2019-04-04 NOTE — Assessment & Plan Note (Signed)
I do think some of her weight gain is secondary to the extra fluid she has on the lower extremities.

## 2019-04-05 ENCOUNTER — Other Ambulatory Visit: Payer: Self-pay

## 2019-04-05 ENCOUNTER — Encounter: Payer: Self-pay | Admitting: "Endocrinology

## 2019-04-05 ENCOUNTER — Ambulatory Visit (INDEPENDENT_AMBULATORY_CARE_PROVIDER_SITE_OTHER): Payer: Medicare Other | Admitting: "Endocrinology

## 2019-04-05 DIAGNOSIS — E890 Postprocedural hypothyroidism: Secondary | ICD-10-CM

## 2019-04-05 DIAGNOSIS — E782 Mixed hyperlipidemia: Secondary | ICD-10-CM

## 2019-04-05 DIAGNOSIS — R7303 Prediabetes: Secondary | ICD-10-CM

## 2019-04-05 MED ORDER — LEVOTHYROXINE SODIUM 137 MCG PO TABS: ORAL_TABLET | ORAL | 1 refills | Status: DC

## 2019-04-05 NOTE — Progress Notes (Signed)
04/05/2019                                Endocrinology Telehealth Visit Follow up Note -During COVID -19 Pandemic  I connected with Danielle Howard on 04/05/2019   by telephone and verified that I am speaking with the correct person using two identifiers. Danielle Howard, July 28, 1950. she has verbally consented to this visit. All issues noted in this document were discussed and addressed. The format was not optimal for physical exam.   Subjective:    Patient ID: Danielle Howard, female    DOB: [birth year], PCP [name], MD   Past Medical History:  Diagnosis Date  . Arthritis   . Complication of anesthesia    nausea  . Diabetes mellitus without complication ([place])   . Headache   . Hypertension   . Hypothyroidism   . Hypothyroidism   . Iritis   . Thyroid disease    Past Surgical History:  Procedure Laterality Date  . ABDOMINAL HYSTERECTOMY    . CATARACT EXTRACTION Left 2019  . CHOLECYSTECTOMY     02/2012  . COLONOSCOPY N/A 05/25/2013   Procedure: COLONOSCOPY;  Surgeon: Rogene Houston, MD;  Location: [facility];  Service: Endoscopy;  Laterality: N/A;  830-moved to 1055 Ann to notify pt  . COLONOSCOPY N/A 06/21/2018   Procedure: COLONOSCOPY;  Surgeon: Rogene Houston, MD;  Location: [facility];  Service: Endoscopy;  Laterality: N/A;  1020  . JOINT REPLACEMENT Bilateral    2012,2013-bilateral knees  . POLYPECTOMY  06/21/2018   Procedure: POLYPECTOMY;  Surgeon: Rogene Houston, MD;  Location: [facility];  Service: Endoscopy;;  . THYROID SURGERY     Social History   Socioeconomic History  . Marital status: Single    Spouse name: Not on file  . Number of children: Not on file  . Years of education: Not on file  . Highest education level: Not on file  Occupational History  . Not on file  Social Needs  . Financial resource strain: Not on file  . Food insecurity    Worry: Not on file    Inability: Not on file  . Transportation needs    Medical: Not on file     Non-medical: Not on file  Tobacco Use  . Smoking status: Never Smoker  . Smokeless tobacco: Never Used  Substance and Sexual Activity  . Alcohol use: Yes    Comment: occasional  . Drug use: No  . Sexual activity: Yes  Lifestyle  . Physical activity    Days per week: Not on file    Minutes per session: Not on file  . Stress: Not on file  Relationships  . Social Herbalist on phone: Not on file    Gets together: Not on file    Attends religious service: Not on file    Active member of club or organization: Not on file    Attends meetings of clubs or organizations: Not on file    Relationship status: Not on file  Other Topics Concern  . Not on file  Social History Narrative  . Not on file   Outpatient Encounter Medications as of 04/05/2019  Medication Sig  . brimonidine (ALPHAGAN) 0.2 % ophthalmic solution Place 1 drop into the left eye 3 (three) times daily.   . Calcium Carb-Cholecalciferol (CALCIUM 600/VITAMIN D3 PO) Take 1 tablet by mouth daily.  Marland Kitchen  cycloSPORINE modified (NEORAL) 50 MG capsule Take by mouth. Take (2) caps PO Q AM and (1) cap PO Q PM  . folic acid (FOLVITE) 1 MG tablet Take 1 mg by mouth daily.   Marland Kitchen latanoprost (XALATAN) 0.005 % ophthalmic solution PLACE 1 GTT INTO BOTH EYES NIGHTLY  . levothyroxine (SYNTHROID) 137 MCG tablet TAKE 1 TABLET(137 MCG) BY MOUTH DAILY  . meloxicam (MOBIC) 15 MG tablet Take 15 mg by mouth daily.  . methotrexate (RHEUMATREX) 2.5 MG tablet Take 25 mg by mouth every Wednesday.   . Multiple Vitamins-Minerals (MULTIVITAMIN WITH MINERALS) tablet Take 1 tablet by mouth daily.  . potassium chloride SA (KLOR-CON) 20 MEQ tablet TAKE 1 TABLET(20 MEQ) BY MOUTH TWICE DAILY  . timolol (TIMOPTIC) 0.5 % ophthalmic solution Place 1 drop into both eyes 2 (two) times daily.   Marland Kitchen torsemide (DEMADEX) 20 MG tablet Take 1-2 tablets daily prn edema  . verapamil (CALAN-SR) 240 MG CR tablet TAKE 1 TABLET(240 MG) BY MOUTH TWICE DAILY  . [DISCONTINUED]  levothyroxine (SYNTHROID) 137 MCG tablet TAKE 1 TABLET(137 MCG) BY MOUTH DAILY   No facility-administered encounter medications on file as of 04/05/2019.    ALLERGIES: Allergies  Allergen Reactions  . Dicloxacillin Other (See Comments)    GI upset   VACCINATION STATUS: Immunization History  Administered Date(s) Administered  . Fluad Quad(high Dose 65+) 01/13/2019  . Influenza Whole 02/09/2013  . Influenza, High Dose Seasonal PF 01/20/2017, 01/27/2018  . Influenza,inj,Quad PF,6+ Mos 01/15/2015, 01/25/2016  . Influenza-Unspecified 01/13/2014  . Pneumococcal Conjugate-13 01/15/2015  . Pneumococcal Polysaccharide-23 01/13/2014  . Tdap 10/02/2009  . Varicella 03/03/2010    HPI   [name] is a 68 year old female patient with medical history as above.  She is engaged in telehealth via telephone  for follow-up of her  postsurgical hypothyroidism, prediabetes, hyperlipidemia.    She is on levothyroxine 137 mcg po qam. she reports compliance to his medication. She not have any new complaints this morning. -She remains off of metformin for now.  she has family hx of diabetes in her mother. she denies dysphagia, SOB, nor voice change.    Objective:    There were no vitals taken for this visit.  Wt Readings from Last 3 Encounters:  04/04/19 242 lb (109.8 kg)  11/24/18 235 lb (106.6 kg)  11/17/18 235 lb (106.6 kg)      Recent Results (from the past 2160 hour(s))  TSH     Status: None   Collection Time: 03/28/19  7:44 AM  Result Value Ref Range   TSH 2.61 0.40 - 4.50 mIU/L  T4, free     Status: None   Collection Time: 03/28/19  7:44 AM  Result Value Ref Range   Free T4 1.3 0.8 - 1.8 ng/dL  Hemoglobin A1c     Status: None   Collection Time: 03/28/19  7:44 AM  Result Value Ref Range   Hgb A1c MFr Bld 5.6 <5.7 % of total Hgb    Comment: For the purpose of screening for the presence of diabetes: . <5.7%       Consistent with the absence of diabetes 5.7-6.4%    Consistent  with increased risk for diabetes             (prediabetes) > or =6.5%  Consistent with diabetes . This assay result is consistent with a decreased risk of diabetes. . Currently, no consensus exists regarding use of hemoglobin A1c for diagnosis of diabetes in children. . According to [employer]  Diabetes Association (ADA) guidelines, hemoglobin A1c <7.0% represents optimal control in non-pregnant diabetic patients. Different metrics may apply to specific patient populations.  Standards of Medical Care in Diabetes(ADA). .    Mean Plasma Glucose 114 (calc)   eAG (mmol/L) 6.3 (calc)  COMPLETE METABOLIC PANEL WITH GFR     Status: Abnormal   Collection Time: 03/28/19  7:44 AM  Result Value Ref Range   Glucose, Bld 93 65 - 99 mg/dL    Comment: .            Fasting reference interval .    BUN 17 7 - 25 mg/dL   Creat 1.31 (H) 0.60 - 0.93 mg/dL    Comment: For patients >16 years of age, the reference limit for Creatinine is approximately 13% higher for people identified as African-American. .    GFR, Est Non African American 41 (L) > OR = 60 mL/min/1.68m2   GFR, Est African American 48 (L) > OR = 60 mL/min/1.69m2   BUN/Creatinine Ratio 13 6 - 22 (calc)   Sodium 144 135 - 146 mmol/L   Potassium 4.7 3.5 - 5.3 mmol/L   Chloride 106 98 - 110 mmol/L   CO2 28 20 - 32 mmol/L   Calcium 9.5 8.6 - 10.4 mg/dL   Total Protein 7.1 6.1 - 8.1 g/dL   Albumin 4.2 3.6 - 5.1 g/dL   Globulin 2.9 1.9 - 3.7 g/dL (calc)   AG Ratio 1.4 1.0 - 2.5 (calc)   Total Bilirubin 1.1 0.2 - 1.2 mg/dL   Alkaline phosphatase (APISO) 109 37 - 153 U/L   AST 21 10 - 35 U/L   ALT 13 6 - 29 U/L     CMP     Component Value Date/Time   NA 144 03/28/2019 0744   K 4.7 03/28/2019 0744   CL 106 03/28/2019 0744   CO2 28 03/28/2019 0744   GLUCOSE 93 03/28/2019 0744   BUN 17 03/28/2019 0744   CREATININE 1.31 (H) 03/28/2019 0744   CALCIUM 9.5 03/28/2019 0744   PROT 7.1 03/28/2019 0744   ALBUMIN 3.8 07/15/2016 0859    AST 21 03/28/2019 0744   ALT 13 03/28/2019 0744   ALKPHOS 90 07/15/2016 0859   BILITOT 1.1 03/28/2019 0744   GFRNONAA 41 (L) 03/28/2019 0744   GFRAA 48 (L) 03/28/2019 0744     Diabetic Labs (most recent): Lab Results  Component Value Date   HGBA1C 5.6 03/28/2019   HGBA1C 5.4 07/23/2018   HGBA1C 5.7 (H) 07/15/2017     Lipid Panel ( most recent) Lipid Panel     Component Value Date/Time   CHOL 184 07/23/2018 0757   TRIG 75 07/23/2018 0757   HDL 73 07/23/2018 0757   CHOLHDL 2.5 07/23/2018 0757   VLDL 17 07/15/2016 0859   LDLCALC 95 07/23/2018 0757       Assessment & Plan:   1. Hypothyroidism: 2. Hashimoto's thyroiditis 3. Left sided hemithyroidectomy  Her previsit thyroid function tests are consistent with appropriate replacement. She is advised to continue  levothyroxine 137 mcg p.o. every morning.    - We discussed about the correct intake of her thyroid hormone, on empty stomach at fasting, with water, separated by at least 30 minutes from breakfast and other medications,  and separated by more than 4 hours from calcium, iron, multivitamins, acid reflux medications (PPIs). -Patient is made aware of the fact that thyroid hormone replacement is needed for life, dose to be adjusted by periodic monitoring of thyroid function  tests.    Her last thyroid u/s showed normal size right lobe with no nodules.   Pre-diabetes -Her recent A1c was 5.6% still consistent with prediabetes.  She is advised to discontinue metformin in the face of acute renal sufficiency.  She is encouraged to keep her appointment with a nephrologist.  She is advised to avoid any over-the-counter pain medications.  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 Surgically absent left lobe of the thyroid.   - I advised patient to maintain close follow up with [name], MD  for primary care needs.   Time for this visit: 15 minutes. [place]  participated in the discussions, expressed understanding, and voiced agreement with the above plans.  All questions were answered to her satisfaction. she is encouraged to contact clinic should she have any questions or concerns prior to her return visit.   Follow up plan: Return in about 6 months (around 10/04/2019) for Follow up with Pre-visit Labs, Next Visit A1c in Office.  Glade Lloyd, MD Phone: [phone]  Fax: [phone]  This note was partially dictated with voice recognition software. Similar sounding words can be transcribed inadequately or may not  be corrected upon review.  04/05/2019, 10:47 AM

## 2019-04-08 DIAGNOSIS — M25561 Pain in right knee: Secondary | ICD-10-CM | POA: Diagnosis not present

## 2019-05-11 DIAGNOSIS — Z20828 Contact with and (suspected) exposure to other viral communicable diseases: Secondary | ICD-10-CM | POA: Diagnosis not present

## 2019-05-16 DIAGNOSIS — Z01818 Encounter for other preprocedural examination: Secondary | ICD-10-CM | POA: Diagnosis not present

## 2019-05-16 DIAGNOSIS — T8521XD Breakdown (mechanical) of intraocular lens, subsequent encounter: Secondary | ICD-10-CM | POA: Diagnosis not present

## 2019-05-18 DIAGNOSIS — T8522XA Displacement of intraocular lens, initial encounter: Secondary | ICD-10-CM | POA: Diagnosis not present

## 2019-05-18 DIAGNOSIS — T8529XA Other mechanical complication of intraocular lens, initial encounter: Secondary | ICD-10-CM | POA: Diagnosis not present

## 2019-05-18 DIAGNOSIS — H4302 Vitreous prolapse, left eye: Secondary | ICD-10-CM | POA: Diagnosis not present

## 2019-05-18 DIAGNOSIS — T8521XA Breakdown (mechanical) of intraocular lens, initial encounter: Secondary | ICD-10-CM | POA: Diagnosis not present

## 2019-05-18 DIAGNOSIS — Z79899 Other long term (current) drug therapy: Secondary | ICD-10-CM | POA: Diagnosis not present

## 2019-05-18 DIAGNOSIS — I10 Essential (primary) hypertension: Secondary | ICD-10-CM | POA: Diagnosis not present

## 2019-05-19 DIAGNOSIS — Z7952 Long term (current) use of systemic steroids: Secondary | ICD-10-CM | POA: Diagnosis not present

## 2019-05-19 DIAGNOSIS — H35372 Puckering of macula, left eye: Secondary | ICD-10-CM | POA: Diagnosis not present

## 2019-05-19 DIAGNOSIS — H40042 Steroid responder, left eye: Secondary | ICD-10-CM | POA: Diagnosis not present

## 2019-05-19 DIAGNOSIS — H35033 Hypertensive retinopathy, bilateral: Secondary | ICD-10-CM | POA: Diagnosis not present

## 2019-05-19 DIAGNOSIS — Z79899 Other long term (current) drug therapy: Secondary | ICD-10-CM | POA: Diagnosis not present

## 2019-05-19 DIAGNOSIS — E119 Type 2 diabetes mellitus without complications: Secondary | ICD-10-CM | POA: Diagnosis not present

## 2019-05-19 DIAGNOSIS — H2511 Age-related nuclear cataract, right eye: Secondary | ICD-10-CM | POA: Diagnosis not present

## 2019-05-19 DIAGNOSIS — I10 Essential (primary) hypertension: Secondary | ICD-10-CM | POA: Diagnosis not present

## 2019-05-19 DIAGNOSIS — T8521XD Breakdown (mechanical) of intraocular lens, subsequent encounter: Secondary | ICD-10-CM | POA: Diagnosis not present

## 2019-05-19 DIAGNOSIS — Z961 Presence of intraocular lens: Secondary | ICD-10-CM | POA: Diagnosis not present

## 2019-06-16 ENCOUNTER — Other Ambulatory Visit: Payer: Self-pay

## 2019-06-16 ENCOUNTER — Ambulatory Visit: Payer: Medicare Other | Attending: Internal Medicine

## 2019-06-16 DIAGNOSIS — Z23 Encounter for immunization: Secondary | ICD-10-CM | POA: Insufficient documentation

## 2019-06-16 NOTE — Progress Notes (Signed)
   Covid-19 Vaccination Clinic  Name:  Danielle Howard    MRN: [id number] DOB: 1951-01-30  06/16/2019  [name] was observed post Covid-19 immunization for 15 minutes without incidence. She was provided with Vaccine Information Sheet and instruction to access the V-Safe system.   [name] was instructed to call 911 with any severe reactions post vaccine: Marland Kitchen Difficulty breathing  . Swelling of your face and throat  . A fast heartbeat  . A bad rash all over your body  . Dizziness and weakness    Immunizations Administered    Name Date Dose VIS Date Route   Moderna COVID-19 Vaccine 06/16/2019 12:12 PM 0.5 mL 04/05/2019 Intramuscular   Manufacturer: Moderna   Lot: [id number]   [id number]

## 2019-06-27 ENCOUNTER — Other Ambulatory Visit: Payer: Self-pay

## 2019-06-27 MED ORDER — LEVOTHYROXINE SODIUM 137 MCG PO TABS: ORAL_TABLET | ORAL | 1 refills | Status: DC

## 2019-06-28 DIAGNOSIS — H35033 Hypertensive retinopathy, bilateral: Secondary | ICD-10-CM | POA: Diagnosis not present

## 2019-07-18 ENCOUNTER — Ambulatory Visit: Payer: Medicare Other | Attending: Internal Medicine

## 2019-07-18 DIAGNOSIS — Z23 Encounter for immunization: Secondary | ICD-10-CM

## 2019-07-18 NOTE — Progress Notes (Signed)
   Covid-19 Vaccination Clinic  Name:  Panagiota Faul    MRN: [id number] DOB: 1951-04-24  07/18/2019  Ms. Lizaola was observed post Covid-19 immunization for 15 minutes without incident. She was provided with Vaccine Information Sheet and instruction to access the V-Safe system.   Ms. Beltre was instructed to call 911 with any severe reactions post vaccine: Marland Kitchen Difficulty breathing  . Swelling of face and throat  . A fast heartbeat  . A bad rash all over body  . Dizziness and weakness   Immunizations Administered    Name Date Dose VIS Date Route   Moderna COVID-19 Vaccine 07/18/2019 11:42 AM 0.5 mL 04/05/2019 Intramuscular   Manufacturer: Moderna   Lot: [id number]   [place]:[id number]

## 2019-08-08 ENCOUNTER — Ambulatory Visit (INDEPENDENT_AMBULATORY_CARE_PROVIDER_SITE_OTHER): Payer: Medicare Other | Admitting: Family Medicine

## 2019-08-08 ENCOUNTER — Encounter: Payer: Self-pay | Admitting: Family Medicine

## 2019-08-08 ENCOUNTER — Other Ambulatory Visit: Payer: Self-pay

## 2019-08-08 VITALS — BP 138/64 | HR 94 | Temp 98.7°F | Resp 16 | Ht 66.0 in | Wt 229.0 lb

## 2019-08-08 DIAGNOSIS — R7303 Prediabetes: Secondary | ICD-10-CM

## 2019-08-08 DIAGNOSIS — Z6837 Body mass index (BMI) 37.0-37.9, adult: Secondary | ICD-10-CM

## 2019-08-08 DIAGNOSIS — N1832 Chronic kidney disease, stage 3b: Secondary | ICD-10-CM

## 2019-08-08 DIAGNOSIS — I739 Peripheral vascular disease, unspecified: Secondary | ICD-10-CM

## 2019-08-08 DIAGNOSIS — Z0001 Encounter for general adult medical examination with abnormal findings: Secondary | ICD-10-CM | POA: Diagnosis not present

## 2019-08-08 DIAGNOSIS — Z0000 Encounter for general adult medical examination without abnormal findings: Secondary | ICD-10-CM

## 2019-08-08 DIAGNOSIS — I10 Essential (primary) hypertension: Secondary | ICD-10-CM

## 2019-08-08 DIAGNOSIS — E890 Postprocedural hypothyroidism: Secondary | ICD-10-CM

## 2019-08-08 DIAGNOSIS — E782 Mixed hyperlipidemia: Secondary | ICD-10-CM

## 2019-08-08 NOTE — Patient Instructions (Signed)
F/U 6 months

## 2019-08-08 NOTE — Progress Notes (Signed)
Subjective:   Patient presents for Medicare Annual/Subsequent preventive examination.    HTN- taking verapamil as prescribed, no side effects with the medications   Chronic edema- takes demadex as needed , she has been taking fluid    Followed by Dr. Manuella Ghazi ophthalmology- MTX and folic acid, cycloproin, drops, has Drops- Iritis, Hypertnsive retinopathy, She had  Left eye surgery Jan13th  She has been waking for exercise daily  weight down 13lbs since NOvember though she is not sure why. She does try to watch her sugar intake   Glucose intolerance- last A1C 5.6 reviewed her recent labs from November 2020  Hypothyroidism followed by endocrinology -has lab order for her next appt     CKD- GFR 40, she is currently on mobic as needed  Her father is 59, Mother is 35     Review Past Medical/Family/Social: Per EMR   Risk Factors  Current exercise habits:  Exercises most days Dietary issues discussed: Yes  Cardiac risk factors: Obesity (BMI >= 30 kg/m2). HTN, borderline DM  Depression Screen  (Note: if answer to either of the following is "Yes", a more complete depression screening is indicated)  Over the past two weeks, have you felt down, depressed or hopeless? No Over the past two weeks, have you felt little interest or pleasure in doing things? No Have you lost interest or pleasure in daily life? No Do you often feel hopeless? No Do you cry easily over simple problems? No   Activities of Daily Living  In your present state of health, do you have any difficulty performing the following activities?:  Driving? No  Managing money? No  Feeding yourself? No  Getting from bed to chair? No  Climbing a flight of stairs? Yes  Preparing food and eating?: No  Bathing or showering? No  Getting dressed: No  Getting to the toilet? No  Using the toilet:No  Moving around from place to place: No  In the past year have you fallen or had a near fall?:No  Are you sexually  active? No  Do you have more than one partner? No   Hearing Difficulties: No  Do you often ask people to speak up or repeat themselves? No  Do you experience ringing or noises in your ears? No Do you have difficulty understanding soft or whispered voices? No  Do you feel that you have a problem with memory? No Do you often misplace items? No  Do you feel safe at home? Yes  Cognitive Testing  Alert? Yes Normal Appearance?Yes  Oriented to person? Yes Place? Yes  Time? Yes  Recall of three objects? Yes  Can perform simple calculations? Yes  Displays appropriate judgment?Yes  Can read the correct time from a watch face?Yes   List the Names of Other Physician/Practitioners you currently use:   Ophthalmology/ Dr. Manuella Ghazi- [facility]  Endocrinology  Podiatry   Screening Tests / Date ColonoscopyUTD Zostavax- UTD ,  Pneumonia- UTD MammogramUTD Influenza VaccineUTD COVID 50 - UTD  Tetanus/tdapUTD Bone Density- Normal 2016  ROS: GEN- denies fatigue, fever, weight loss,weakness, recent illness HEENT- denies eye drainage, change in vision, nasal discharge, CVS- denies chest pain, palpitations RESP- denies SOB, cough, wheeze ABD- denies N/V, change in stools, abd pain GU- denies dysuria, hematuria, dribbling, incontinence MSK- + joint pain, muscle aches, injury Neuro- denies headache, dizziness, syncope, seizure activity  PHYSICAL: Vitals reviewed  GEN- NAD, alert and oriented x3 HEENT- PERRL, EOMI, non injected sclera, pink conjunctiva, TM CLEAR BILAT no effusion Neck-  Supple, no thryomegaly, no bruit CVS- RRR, no murmur RESP-CTAB ABS-NABS,soft,NT,ND EXT- 1+ pitting edema bilat  Pulses- Radial 2+  Assessment:    Annual wellness medicare exam   Plan:    During the course of the visit the patient was educated and counseled about appropriate screening and preventive services including:  Functional screen/audit c/fall /depression  screening NEGATIVE   Prevention UTD/ Immunizations  UTD   Discussed advanced directives- pt has at home,states she does not want prolonged life support but is FULL CODE    Fasting labs done for cholesterol and renal function    HTN- well controlled   Hypothyroidism- per endocrinology, but I will go ahead and draw the labs today.  She does have a weight loss which she states she is unclear why she has lost weight.  We will make sure that thyroid is not contributing.  She is also had some borderline diabetes mellitus so I will check her A1c as well  Weight loss- per above, cancer screening UTD    Edema- continue compression hose and diuretics.  She has not taken her diuretics for today.  Continue with walking for exercise and mobility  Follow-up 6 months   Diet review for nutrition referral? Yes ____ Not Indicated __x__  Patient Instructions (the written plan) was given to the patient.  Medicare Attestation  I have personally reviewed:  The patient's medical and social history  Their use of alcohol, tobacco or illicit drugs  Their current medications and supplements  The patient's functional ability including ADLs,fall risks, home safety risks, cognitive, and hearing and visual impairment  Diet and physical activities  Evidence for depression or mood disorders  The patient's weight, height, BMI, and visual acuity have been recorded in the chart. I have made referrals, counseling, and provided education to the patient based on review of the above and I have provided the patient with a written personalized care plan for preventive services.

## 2019-08-09 LAB — CBC WITH DIFFERENTIAL/PLATELET
Absolute Monocytes: 500 cells/uL (ref 200–950)
Basophils Absolute: 43 cells/uL (ref 0–200)
Basophils Relative: 0.7 %
Eosinophils Absolute: 189 cells/uL (ref 15–500)
Eosinophils Relative: 3.1 %
HCT: 36.3 % (ref 35.0–45.0)
Hemoglobin: 11.8 g/dL (ref 11.7–15.5)
Lymphs Abs: 1324 cells/uL (ref 850–3900)
MCH: 30.6 pg (ref 27.0–33.0)
MCHC: 32.5 g/dL (ref 32.0–36.0)
MCV: 94.3 fL (ref 80.0–100.0)
MPV: 10.1 fL (ref 7.5–12.5)
Monocytes Relative: 8.2 %
Neutro Abs: 4044 cells/uL (ref 1500–7800)
Neutrophils Relative %: 66.3 %
Platelets: 418 10*3/uL — ABNORMAL HIGH (ref 140–400)
RBC: 3.85 10*6/uL (ref 3.80–5.10)
RDW: 15.5 % — ABNORMAL HIGH (ref 11.0–15.0)
Total Lymphocyte: 21.7 %
WBC: 6.1 10*3/uL (ref 3.8–10.8)

## 2019-08-09 LAB — LIPID PANEL
Cholesterol: 201 mg/dL — ABNORMAL HIGH (ref <200)
HDL: 73 mg/dL (ref >=50)
LDL Cholesterol (Calc): 111 mg/dL (calc) — ABNORMAL HIGH
Non-HDL Cholesterol (Calc): 128 mg/dL (calc) (ref <130)
Total CHOL/HDL Ratio: 2.8 (calc) (ref <5.0)
Triglycerides: 81 mg/dL (ref <150)

## 2019-08-09 LAB — COMPREHENSIVE METABOLIC PANEL
AG Ratio: 1.6 (calc) (ref 1.0–2.5)
ALT: 15 U/L (ref 6–29)
AST: 21 U/L (ref 10–35)
Albumin: 4.4 g/dL (ref 3.6–5.1)
Alkaline phosphatase (APISO): 107 U/L (ref 37–153)
BUN/Creatinine Ratio: 18 (calc) (ref 6–22)
BUN: 21 mg/dL (ref 7–25)
CO2: 29 mmol/L (ref 20–32)
Calcium: 9.5 mg/dL (ref 8.6–10.4)
Chloride: 105 mmol/L (ref 98–110)
Creat: 1.17 mg/dL — ABNORMAL HIGH (ref 0.60–0.93)
Globulin: 2.8 g/dL (calc) (ref 1.9–3.7)
Glucose, Bld: 80 mg/dL (ref 65–99)
Potassium: 4.5 mmol/L (ref 3.5–5.3)
Sodium: 142 mmol/L (ref 135–146)
Total Bilirubin: 1.1 mg/dL (ref 0.2–1.2)
Total Protein: 7.2 g/dL (ref 6.1–8.1)

## 2019-08-09 LAB — HEMOGLOBIN A1C
Hgb A1c MFr Bld: 5.6 % of total Hgb (ref <5.7)
Mean Plasma Glucose: 114 (calc)
eAG (mmol/L): 6.3 (calc)

## 2019-08-09 LAB — T4, FREE: Free T4: 1.4 ng/dL (ref 0.8–1.8)

## 2019-08-09 LAB — TSH: TSH: 1.09 mIU/L (ref 0.40–4.50)

## 2019-08-23 DIAGNOSIS — H35033 Hypertensive retinopathy, bilateral: Secondary | ICD-10-CM | POA: Diagnosis not present

## 2019-08-30 DIAGNOSIS — Z961 Presence of intraocular lens: Secondary | ICD-10-CM | POA: Diagnosis not present

## 2019-08-30 DIAGNOSIS — Z79899 Other long term (current) drug therapy: Secondary | ICD-10-CM | POA: Diagnosis not present

## 2019-08-30 DIAGNOSIS — E1136 Type 2 diabetes mellitus with diabetic cataract: Secondary | ICD-10-CM | POA: Diagnosis not present

## 2019-08-30 DIAGNOSIS — T8521XD Breakdown (mechanical) of intraocular lens, subsequent encounter: Secondary | ICD-10-CM | POA: Diagnosis not present

## 2019-08-30 DIAGNOSIS — H20042 Secondary noninfectious iridocyclitis, left eye: Secondary | ICD-10-CM | POA: Diagnosis not present

## 2019-08-30 DIAGNOSIS — H35033 Hypertensive retinopathy, bilateral: Secondary | ICD-10-CM | POA: Diagnosis not present

## 2019-08-30 DIAGNOSIS — H2511 Age-related nuclear cataract, right eye: Secondary | ICD-10-CM | POA: Diagnosis not present

## 2019-08-30 DIAGNOSIS — H35372 Puckering of macula, left eye: Secondary | ICD-10-CM | POA: Diagnosis not present

## 2019-09-15 ENCOUNTER — Other Ambulatory Visit: Payer: Self-pay | Admitting: Family Medicine

## 2019-09-19 ENCOUNTER — Other Ambulatory Visit (HOSPITAL_COMMUNITY): Payer: Self-pay | Admitting: Family Medicine

## 2019-09-19 DIAGNOSIS — Z1231 Encounter for screening mammogram for malignant neoplasm of breast: Secondary | ICD-10-CM

## 2019-10-05 ENCOUNTER — Other Ambulatory Visit: Payer: Self-pay

## 2019-10-05 ENCOUNTER — Ambulatory Visit: Payer: Medicare Other | Admitting: "Endocrinology

## 2019-10-05 ENCOUNTER — Ambulatory Visit (INDEPENDENT_AMBULATORY_CARE_PROVIDER_SITE_OTHER): Payer: Medicare Other | Admitting: "Endocrinology

## 2019-10-05 ENCOUNTER — Encounter: Payer: Self-pay | Admitting: "Endocrinology

## 2019-10-05 VITALS — BP 146/76 | HR 88 | Ht 66.0 in | Wt 230.0 lb

## 2019-10-05 DIAGNOSIS — E890 Postprocedural hypothyroidism: Secondary | ICD-10-CM

## 2019-10-05 DIAGNOSIS — R7303 Prediabetes: Secondary | ICD-10-CM | POA: Diagnosis not present

## 2019-10-05 NOTE — Patient Instructions (Signed)

## 2019-10-05 NOTE — Progress Notes (Signed)
10/05/2019     Endocrinology follow-up note  Subjective:    Patient ID: Danielle Howard, female    DOB: January 25, 1951, PCP Danielle Rossetti, MD   Past Medical History:  Diagnosis Date  . Arthritis   . Complication of anesthesia    nausea  . Diabetes mellitus without complication ([place])   . Headache   . Hypertension   . Hypothyroidism   . Hypothyroidism   . Iritis   . Thyroid disease    Past Surgical History:  Procedure Laterality Date  . ABDOMINAL HYSTERECTOMY    . CATARACT EXTRACTION Left 2019  . CHOLECYSTECTOMY     02/2012  . COLONOSCOPY N/A 05/25/2013   Procedure: COLONOSCOPY;  Surgeon: Rogene Houston, MD;  Location: [facility];  Service: Endoscopy;  Laterality: N/A;  830-moved to 1055 Ann to notify pt  . COLONOSCOPY N/A 06/21/2018   Procedure: COLONOSCOPY;  Surgeon: Rogene Houston, MD;  Location: [facility];  Service: Endoscopy;  Laterality: N/A;  1020  . JOINT REPLACEMENT Bilateral    2012,2013-bilateral knees  . POLYPECTOMY  06/21/2018   Procedure: POLYPECTOMY;  Surgeon: Rogene Houston, MD;  Location: [facility];  Service: Endoscopy;;  . THYROID SURGERY     Social History   Socioeconomic History  . Marital status: Single    Spouse name: Not on file  . Number of children: Not on file  . Years of education: Not on file  . Highest education level: Not on file  Occupational History  . Not on file  Tobacco Use  . Smoking status: Never Smoker  . Smokeless tobacco: Never Used  Substance and Sexual Activity  . Alcohol use: Yes    Comment: occasional  . Drug use: No  . Sexual activity: Yes  Other Topics Concern  . Not on file  Social History Narrative  . Not on file   Social Determinants of Health   Financial Resource Strain:   . Difficulty of Paying Living Expenses:   Food Insecurity:   . Worried About Charity fundraiser in the Last Year:   . Arboriculturist in the Last Year:   Transportation Needs:   . Film/video editor (Medical):   Marland Kitchen  Lack of Transportation (Non-Medical):   Physical Activity:   . Days of Exercise per Week:   . Minutes of Exercise per Session:   Stress:   . Feeling of Stress :   Social Connections:   . Frequency of Communication with Friends and Family:   . Frequency of Social Gatherings with Friends and Family:   . Attends Religious Services:   . Active Member of Clubs or Organizations:   . Attends Archivist Meetings:   Marland Kitchen Marital Status:    Outpatient Encounter Medications as of 10/05/2019  Medication Sig  . Calcium Carb-Cholecalciferol (CALCIUM 600/VITAMIN D3 PO) Take 1 tablet by mouth daily.  . cycloSPORINE modified (NEORAL) 50 MG capsule Take by mouth. Take (2) caps PO Q AM and (1) cap PO Q PM  . dorzolamide (TRUSOPT) 2 % ophthalmic solution Place 1 drop into both eyes 2 (two) times daily.  . folic acid (FOLVITE) 1 MG tablet Take 1 mg by mouth daily.   Marland Kitchen levothyroxine (SYNTHROID) 137 MCG tablet TAKE 1 TABLET(137 MCG) BY MOUTH DAILY  . methotrexate (RHEUMATREX) 2.5 MG tablet Take 25 mg by mouth every Wednesday. 10 tabs  . Multiple Vitamins-Minerals (MULTIVITAMIN WITH MINERALS) tablet Take 1 tablet by mouth daily.  Marland Kitchen  potassium chloride SA (KLOR-CON) 20 MEQ tablet TAKE 1 TABLET(20 MEQ) BY MOUTH TWICE DAILY  . torsemide (DEMADEX) 20 MG tablet Take 1-2 tablets daily prn edema  . verapamil (CALAN-SR) 240 MG CR tablet TAKE 1 TABLET(240 MG) BY MOUTH TWICE DAILY  . [DISCONTINUED] meloxicam (MOBIC) 15 MG tablet Take 15 mg by mouth daily.  . [DISCONTINUED] prednisoLONE acetate (PRED FORTE) 1 % ophthalmic suspension Place 1 drop into the left eye 4 (four) times daily.   No facility-administered encounter medications on file as of 10/05/2019.   ALLERGIES: Allergies  Allergen Reactions  . Acetazolamide   . Dicloxacillin Other (See Comments)    GI upset   VACCINATION STATUS: Immunization History  Administered Date(s) Administered  . Fluad Quad(high Dose 65+) 01/13/2019  . Influenza Whole  02/09/2013  . Influenza, High Dose Seasonal PF 01/20/2017, 01/27/2018  . Influenza,inj,Quad PF,6+ Mos 01/15/2015, 01/25/2016  . Influenza-Unspecified 01/13/2014  . Moderna SARS-COVID-2 Vaccination 06/16/2019, 07/18/2019  . Pneumococcal Conjugate-13 01/15/2015  . Pneumococcal Polysaccharide-23 01/13/2014  . Tdap 10/02/2009  . Varicella 03/03/2010    HPI   [name] is a 69 year old female with medical history as above.  She is being seen in follow-up for postsurgical hypothyroidism, prediabetes, history of nodular goiter.   She is on levothyroxine 137 mcg po qam. she reports compliance to his medication. She not have any new complaints this morning. -She remains off of metformin for now.  She has lost 11 pounds since last visit.  Her previsit labs show A1c of 5.6%, improving.  she has family hx of diabetes in her mother. she denies dysphagia, SOB, nor voice change.    Objective:    BP (!) 146/76   Pulse 88   Ht 5\' 6"  (1.676 m)   Wt 230 lb (104.3 kg)   BMI 37.12 kg/m   Wt Readings from Last 3 Encounters:  10/05/19 230 lb (104.3 kg)  08/08/19 229 lb (103.9 kg)  04/04/19 242 lb (109.8 kg)      Recent Results (from the past 2160 hour(s))  CBC with Differential/Platelet     Status: Abnormal   Collection Time: 08/08/19 10:09 AM  Result Value Ref Range   WBC 6.1 3.8 - 10.8 Thousand/uL   RBC 3.85 3.80 - 5.10 Million/uL   Hemoglobin 11.8 11.7 - 15.5 g/dL   HCT 36.3 35.0 - 45.0 %   MCV 94.3 80.0 - 100.0 fL   MCH 30.6 27.0 - 33.0 pg   MCHC 32.5 32.0 - 36.0 g/dL   RDW 15.5 (H) 11.0 - 15.0 %   Platelets 418 (H) 140 - 400 Thousand/uL   MPV 10.1 7.5 - 12.5 fL   Neutro Abs 4,044 1,500 - 7,800 cells/uL   Lymphs Abs 1,324 850 - 3,900 cells/uL   Absolute Monocytes 500 200 - 950 cells/uL   Eosinophils Absolute 189 15 - 500 cells/uL   Basophils Absolute 43 0 - 200 cells/uL   Neutrophils Relative % 66.3 %   Total Lymphocyte 21.7 %   Monocytes Relative 8.2 %   Eosinophils  Relative 3.1 %   Basophils Relative 0.7 %  Comprehensive metabolic panel     Status: Abnormal   Collection Time: 08/08/19 10:09 AM  Result Value Ref Range   Glucose, Bld 80 65 - 99 mg/dL    Comment: .            Fasting reference interval .    BUN 21 7 - 25 mg/dL   Creat 1.17 (H) 0.60 - 0.93 mg/dL  Comment: For patients >62 years of age, the reference limit for Creatinine is approximately 13% higher for people identified as African-American. .    BUN/Creatinine Ratio 18 6 - 22 (calc)   Sodium 142 135 - 146 mmol/L   Potassium 4.5 3.5 - 5.3 mmol/L   Chloride 105 98 - 110 mmol/L   CO2 29 20 - 32 mmol/L   Calcium 9.5 8.6 - 10.4 mg/dL   Total Protein 7.2 6.1 - 8.1 g/dL   Albumin 4.4 3.6 - 5.1 g/dL   Globulin 2.8 1.9 - 3.7 g/dL (calc)   AG Ratio 1.6 1.0 - 2.5 (calc)   Total Bilirubin 1.1 0.2 - 1.2 mg/dL   Alkaline phosphatase (APISO) 107 37 - 153 U/L   AST 21 10 - 35 U/L   ALT 15 6 - 29 U/L  Lipid panel     Status: Abnormal   Collection Time: 08/08/19 10:09 AM  Result Value Ref Range   Cholesterol 201 (H) <200 mg/dL   HDL 73 > OR = 50 mg/dL   Triglycerides 81 <150 mg/dL   LDL Cholesterol (Calc) 111 (H) mg/dL (calc)    Comment: Reference range: <100 . Desirable range <100 mg/dL for primary prevention;   <70 mg/dL for patients with CHD or diabetic patients  with > or = 2 CHD risk factors. Marland Kitchen LDL-C is now calculated using the Martin-Hopkins  calculation, which is a validated novel method providing  better accuracy than the Friedewald equation in the  estimation of LDL-C.  Cresenciano Genre et al. Annamaria Helling. [id number]): 2061-2068  (http://education.[facility].com/faq/FAQ164)    Total CHOL/HDL Ratio 2.8 <5.0 (calc)   Non-HDL Cholesterol (Calc) 128 <130 mg/dL (calc)    Comment: For patients with diabetes plus 1 major ASCVD risk  factor, treating to a non-HDL-C goal of <100 mg/dL  (LDL-C of <70 mg/dL) is considered a therapeutic  option.   TSH     Status: None   Collection  Time: 08/08/19 10:09 AM  Result Value Ref Range   TSH 1.09 0.40 - 4.50 mIU/L  T4, free     Status: None   Collection Time: 08/08/19 10:09 AM  Result Value Ref Range   Free T4 1.4 0.8 - 1.8 ng/dL  Hemoglobin A1c     Status: None   Collection Time: 08/08/19 10:09 AM  Result Value Ref Range   Hgb A1c MFr Bld 5.6 <5.7 % of total Hgb    Comment: For the purpose of screening for the presence of diabetes: . <5.7%       Consistent with the absence of diabetes 5.7-6.4%    Consistent with increased risk for diabetes             (prediabetes) > or =6.5%  Consistent with diabetes . This assay result is consistent with a decreased risk of diabetes. . Currently, no consensus exists regarding use of hemoglobin A1c for diagnosis of diabetes in children. . According to American Diabetes Association (ADA) guidelines, hemoglobin A1c <7.0% represents optimal control in non-pregnant diabetic patients. Different metrics may apply to specific patient populations.  Standards of Medical Care in Diabetes(ADA). .    Mean Plasma Glucose 114 (calc)   eAG (mmol/L) 6.3 (calc)     CMP     Component Value Date/Time   NA 142 08/08/2019 1009   K 4.5 08/08/2019 1009   CL 105 08/08/2019 1009   CO2 29 08/08/2019 1009   GLUCOSE 80 08/08/2019 1009   BUN 21 08/08/2019 1009   CREATININE 1.17 (  H) 08/08/2019 1009   CALCIUM 9.5 08/08/2019 1009   PROT 7.2 08/08/2019 1009   ALBUMIN 3.8 07/15/2016 0859   AST 21 08/08/2019 1009   ALT 15 08/08/2019 1009   ALKPHOS 90 07/15/2016 0859   BILITOT 1.1 08/08/2019 1009   GFRNONAA 41 (L) 03/28/2019 0744   GFRAA 48 (L) 03/28/2019 0744     Diabetic Labs (most recent): Lab Results  Component Value Date   HGBA1C 5.6 08/08/2019   HGBA1C 5.6 03/28/2019   HGBA1C 5.4 07/23/2018     Lipid Panel ( most recent) Lipid Panel     Component Value Date/Time   CHOL 201 (H) 08/08/2019 1009   TRIG 81 08/08/2019 1009   HDL 73 08/08/2019 1009   CHOLHDL 2.8 08/08/2019 1009    VLDL 17 07/15/2016 0859   LDLCALC 111 (H) 08/08/2019 1009       Assessment & Plan:   1. Hypothyroidism: 2. Hashimoto's thyroiditis 3. Left sided hemithyroidectomy  Her previsit thyroid function tests are consistent with appropriate replacement.  She is advised to continue   levothyroxine 137 mcg p.o. every morning.    - We discussed about the correct intake of her thyroid hormone, on empty stomach at fasting, with water, separated by at least 30 minutes from breakfast and other medications,  and separated by more than 4 hours from calcium, iron, multivitamins, acid reflux medications (PPIs). -Patient is made aware of the fact that thyroid hormone replacement is needed for life, dose to be adjusted by periodic monitoring of thyroid function tests.  Her last thyroid u/s showed normal size right lobe with no nodules.   Pre-diabetes -Her previsit labs show stable A1c of 5.6%.  She did have history of prediabetes before.  She will not need metformin intervention at this time, her renal function is improving.  - she  admits there is a room for improvement in her diet and drink choices. -  Suggestion is made for her to avoid simple carbohydrates  from her diet including Cakes, Sweet Desserts / Pastries, Ice Cream, Soda (diet and regular), Sweet Tea, Candies, Chips, Cookies, Sweet Pastries,  Store Bought Juices, Alcohol in Excess of  1-2 drinks a day, Artificial Sweeteners, Coffee Creamer, and "Sugar-free" Products. This will help patient to have stable blood glucose profile and potentially avoid unintended weight gain.  -She will need fasting lipid panel repeated in a year.  She is encouraged to keep her appointment with a nephrologist.  She is advised to avoid any over-the-counter pain medications.  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 Surgically absent left  lobe of the thyroid.   - I advised patient to maintain close follow up with Danielle Rossetti, MD for primary care needs.     - Time spent on this patient care encounter:  20 minutes of which 50% was spent in  counseling and the rest reviewing  her current and  previous labs / studies and medications  doses and developing a plan for long term care. Ena Frickey  participated in the discussions, expressed understanding, and voiced agreement with the above plans.  All questions were answered to her satisfaction. she is encouraged to contact clinic should she have any questions or concerns prior to her return visit.    Follow up plan: Return in about 6 months (around 04/05/2020) for F/U with Pre-visit Labs.  Glade Lloyd, MD Phone: [phone]  Fax: [phone]  This note was partially dictated with voice recognition software. Similar sounding words can be transcribed inadequately or may not  be corrected upon review.  10/05/2019, 3:16 PM

## 2019-11-02 ENCOUNTER — Ambulatory Visit (HOSPITAL_COMMUNITY)
Admission: RE | Admit: 2019-11-02 | Discharge: 2019-11-02 | Disposition: A | Discharge status: Home / Self Care | Payer: Medicare Other | Source: Ambulatory Visit | Attending: Family Medicine | Admitting: Family Medicine

## 2019-11-02 ENCOUNTER — Ambulatory Visit (HOSPITAL_COMMUNITY): Payer: Medicare Other

## 2019-11-02 ENCOUNTER — Other Ambulatory Visit: Payer: Self-pay

## 2019-11-02 DIAGNOSIS — Z1231 Encounter for screening mammogram for malignant neoplasm of breast: Secondary | ICD-10-CM | POA: Diagnosis present

## 2019-11-15 ENCOUNTER — Other Ambulatory Visit: Payer: Self-pay

## 2019-11-15 ENCOUNTER — Ambulatory Visit (INDEPENDENT_AMBULATORY_CARE_PROVIDER_SITE_OTHER): Payer: Medicare Other | Admitting: Family Medicine

## 2019-11-15 ENCOUNTER — Encounter: Payer: Self-pay | Admitting: Family Medicine

## 2019-11-15 VITALS — BP 138/74 | HR 90 | Temp 97.7°F | Resp 16

## 2019-11-15 DIAGNOSIS — M545 Low back pain, unspecified: Secondary | ICD-10-CM

## 2019-11-15 MED ORDER — TIZANIDINE HCL 4 MG PO TABS: 4.0000 mg | ORAL_TABLET | Freq: Four times a day (QID) | ORAL | 0 refills | Status: DC | PRN

## 2019-11-15 NOTE — Progress Notes (Signed)
   Subjective:    Patient ID: Danielle Howard, female    DOB: [birth year], 69 y.o.   MRN: [id number]  Patient presents for L Sided Pain (x10 days- pain under L sided ribs that radiates down to hip- states that she does move a lot of stuff around and thinks it may be pulled muscle) and Labs Only (has results from another Dr she would like to discuss)  Pt here with left sided back pain, down lateral side toward left hip for past 1-2 weeks. When she tries to lay on her left side, she has pain, if she coughs or sneezes has pain  When does a housework bending or stooping has discomfort She does assist her parents but does not remember a particular injury She has been taking tylenol  No change in bowel movements, she takes stool softner  No pain with urination  No rash on skin   She is still on MTX, cyclosporine folic acid and drops for her eyes Had recent labs, did not understand results     Review Of Systems:  GEN- denies fatigue, fever, weight loss,weakness, recent illness HEENT- denies eye drainage, change in vision, nasal discharge, CVS- denies chest pain, palpitations RESP- denies SOB, cough, wheeze ABD- denies N/V, change in stools, abd pain GU- denies dysuria, hematuria, dribbling, incontinence MSK- + joint pain+, muscle aches, injury Neuro- denies headache, dizziness, syncope, seizure activity       Objective:    BP 138/74   Pulse 90   Temp 97.7 F (36.5 C) (Temporal)   Resp 16   SpO2 95%  GEN- NAD, alert and oriented x3 CVS- RRR, no murmur RESP-CTAB ABD-NABS,soft,NT,ND MSK- TTP lumbar spine and left paraspinals, side wall, neg SLR, fair ROM spine, hips/ decreased ROM KNEES Neuro- antalgic gait, sensation grossly in tact, strength equal bilat LE EXT- 1+  Edema with venous stasis changes to mid shins Pulses- Radial, DP- palpated         Assessment & Plan:      Problem List Items Addressed This Visit    None    Visit Diagnoses    Acute left-sided low back pain  without sciatica    -  Primary   No red flags, no rash, pain appears to be from spine with some muscular tenderness, apply heating pad, topical muscle rub, tylenol, zanaflex for bedtime Hold on imaging  Reviewed labs, no significant changes from baseline    Relevant Medications   tiZANidine (ZANAFLEX) 4 MG tablet      Note: This dictation was prepared with Dragon dictation along with smaller phrase technology. Any transcriptional errors that result from this process are unintentional.

## 2019-11-15 NOTE — Patient Instructions (Signed)
Use heating pad Add topical muscle rub  Take zanaflex at bedtime  F/U as previous

## 2019-12-09 ENCOUNTER — Other Ambulatory Visit: Payer: Self-pay | Admitting: Family Medicine

## 2019-12-22 ENCOUNTER — Other Ambulatory Visit: Payer: Self-pay | Admitting: "Endocrinology

## 2019-12-27 DIAGNOSIS — H2511 Age-related nuclear cataract, right eye: Secondary | ICD-10-CM | POA: Diagnosis not present

## 2019-12-27 DIAGNOSIS — T8521XD Breakdown (mechanical) of intraocular lens, subsequent encounter: Secondary | ICD-10-CM | POA: Diagnosis not present

## 2019-12-27 DIAGNOSIS — E1136 Type 2 diabetes mellitus with diabetic cataract: Secondary | ICD-10-CM | POA: Diagnosis not present

## 2019-12-27 DIAGNOSIS — Z79899 Other long term (current) drug therapy: Secondary | ICD-10-CM | POA: Diagnosis not present

## 2019-12-27 DIAGNOSIS — Z961 Presence of intraocular lens: Secondary | ICD-10-CM | POA: Diagnosis not present

## 2019-12-27 DIAGNOSIS — H35372 Puckering of macula, left eye: Secondary | ICD-10-CM | POA: Diagnosis not present

## 2019-12-27 DIAGNOSIS — H20042 Secondary noninfectious iridocyclitis, left eye: Secondary | ICD-10-CM | POA: Diagnosis not present

## 2019-12-27 DIAGNOSIS — H35033 Hypertensive retinopathy, bilateral: Secondary | ICD-10-CM | POA: Diagnosis not present

## 2020-01-25 DIAGNOSIS — I878 Other specified disorders of veins: Secondary | ICD-10-CM | POA: Diagnosis not present

## 2020-01-26 DIAGNOSIS — Z23 Encounter for immunization: Secondary | ICD-10-CM | POA: Diagnosis not present

## 2020-02-10 ENCOUNTER — Encounter: Payer: Self-pay | Admitting: Family Medicine

## 2020-02-10 ENCOUNTER — Ambulatory Visit (INDEPENDENT_AMBULATORY_CARE_PROVIDER_SITE_OTHER): Payer: Medicare Other | Admitting: Family Medicine

## 2020-02-10 ENCOUNTER — Other Ambulatory Visit: Payer: Self-pay

## 2020-02-10 VITALS — BP 142/68 | HR 76 | Temp 98.6°F | Resp 14 | Ht 66.0 in | Wt 230.0 lb

## 2020-02-10 DIAGNOSIS — Z6837 Body mass index (BMI) 37.0-37.9, adult: Secondary | ICD-10-CM

## 2020-02-10 DIAGNOSIS — E890 Postprocedural hypothyroidism: Secondary | ICD-10-CM

## 2020-02-10 DIAGNOSIS — N1832 Chronic kidney disease, stage 3b: Secondary | ICD-10-CM

## 2020-02-10 DIAGNOSIS — I739 Peripheral vascular disease, unspecified: Secondary | ICD-10-CM | POA: Diagnosis not present

## 2020-02-10 DIAGNOSIS — R7303 Prediabetes: Secondary | ICD-10-CM

## 2020-02-10 DIAGNOSIS — R82998 Other abnormal findings in urine: Secondary | ICD-10-CM | POA: Diagnosis not present

## 2020-02-10 DIAGNOSIS — I10 Essential (primary) hypertension: Secondary | ICD-10-CM

## 2020-02-10 LAB — URINALYSIS, ROUTINE W REFLEX MICROSCOPIC
Bilirubin Urine: NEGATIVE
Glucose, UA: NEGATIVE
Hyaline Cast: NONE SEEN /LPF
Ketones, ur: NEGATIVE
Nitrite: NEGATIVE
Protein, ur: NEGATIVE
Specific Gravity, Urine: 1.015 (ref 1.001–1.03)
pH: 7 (ref 5.0–8.0)

## 2020-02-10 LAB — MICROSCOPIC MESSAGE

## 2020-02-10 NOTE — Assessment & Plan Note (Addendum)
Check A1C Note Urine sample was run as UA, isntead of urine micro She did not have any symptoms of UTI but 3+ leukocytes, occ has odor, but no dysuria Sent for culture before treating

## 2020-02-10 NOTE — Progress Notes (Signed)
Subjective:    Patient ID: Danielle Howard, female    DOB: 1950/10/13, 69 y.o.   MRN: [id number]  Patient presents for Follow-up (is fasting)   Pt here f/u chronic medical problems     She had a fall , she fell on a throw rug at her mothers house, she bruised her buttocks   she was seen by ortho after the fall no broken bone or injury  She has noticed more darkerning of the lower leg with her chronic swelling   she has appt in December with vascular   wearing compression hose to the thighs  she is taking the diuretic 1-2 times a day    She is still following with Dr. Manuella Ghazi, she is still on Methotrexate, folic acid, cyclosporin and drops     She stopped going to the [facility] due to issues with her vision  She has been doing telehealth with Endocrinology, needs labs done for thyroid and blood sugar      Review Of Systems:  GEN- denies fatigue, fever, weight loss,weakness, recent illness HEENT- denies eye drainage, change in vision, nasal discharge, CVS- denies chest pain, palpitations RESP- denies SOB, cough, wheeze ABD- denies N/V, change in stools, abd pain GU- denies dysuria, hematuria, dribbling, incontinence MSK-+ joint pain, muscle aches, injury Neuro- denies headache, dizziness, syncope, seizure activity       Objective:    BP (!) 142/68   Pulse 76   Temp 98.6 F (37 C) (Temporal)   Resp 14   Ht 5\' 6"  (1.676 m)   Wt 230 lb (104.3 kg)   SpO2 99%   BMI 37.12 kg/m  GEN- NAD, alert and oriented x3 HEENT- PERRL, EOMI,  Neck- Supple, no thyromegaly, no JVD CVS- RRR, no murmur RESP-CTAB ABD-NABS,soft,NT,ND EXT- Chronic venous statis changes, hyperpigmentation to lower ext, 1+chronic edema  Pulses- Radial 2+, DP palpated         Assessment & Plan:      Problem List Items Addressed This Visit      Unprioritized   Chronic kidney disease (CKD), stage III (moderate) (HCC)   Essential hypertension, benign - Primary    No meds this AM Typically controlled No  changes, check renal function       Relevant Orders   CBC with Differential/Platelet   Comprehensive metabolic panel   Lipid panel   Hypothyroidism    Will defer labs to endocrinology       Relevant Orders   TSH   T4, free   Obesity   Prediabetes    Check A1C Note Urine sample was run as UA, isntead of urine micro She did not have any symptoms of UTI but 3+ leukocytes, occ has odor, but no dysuria Sent for culture before treating       Relevant Orders   Hemoglobin A1c   Microalbumin / creatinine urine ratio   Urinalysis, Routine w reflex microscopic   Urine Culture   PVD (peripheral vascular disease) (HCC)    PVD known with chronic venous statis changes, edema She does not typically take her diuiretic BID, elevate feel, compression hose, F/U vascular       Other Visit Diagnoses    Urine leukocytes       Relevant Orders   Urinalysis, Routine w reflex microscopic   Urine Culture      Note: This dictation was prepared with Dragon dictation along with smaller phrase technology. Any transcriptional errors that result from this process are unintentional.

## 2020-02-10 NOTE — Assessment & Plan Note (Signed)
Will defer labs to endocrinology

## 2020-02-10 NOTE — Patient Instructions (Addendum)
F/u 6 months  We will call with lab results  Ask pharmacy about TDAP

## 2020-02-10 NOTE — Assessment & Plan Note (Signed)
No meds this AM Typically controlled No changes, check renal function

## 2020-02-10 NOTE — Assessment & Plan Note (Signed)
PVD known with chronic venous statis changes, edema She does not typically take her diuiretic BID, elevate feel, compression hose, F/U vascular

## 2020-02-11 LAB — LIPID PANEL
Cholesterol: 176 mg/dL (ref <200)
HDL: 68 mg/dL (ref >=50)
LDL Cholesterol (Calc): 88 mg/dL (calc)
Non-HDL Cholesterol (Calc): 108 mg/dL (calc) (ref <130)
Total CHOL/HDL Ratio: 2.6 (calc) (ref <5.0)
Triglycerides: 106 mg/dL (ref <150)

## 2020-02-11 LAB — COMPREHENSIVE METABOLIC PANEL
AG Ratio: 1.3 (calc) (ref 1.0–2.5)
ALT: 10 U/L (ref 6–29)
AST: 22 U/L (ref 10–35)
Albumin: 4.5 g/dL (ref 3.6–5.1)
Alkaline phosphatase (APISO): 132 U/L (ref 37–153)
BUN/Creatinine Ratio: 16 (calc) (ref 6–22)
BUN: 21 mg/dL (ref 7–25)
CO2: 30 mmol/L (ref 20–32)
Calcium: 9.8 mg/dL (ref 8.6–10.4)
Chloride: 104 mmol/L (ref 98–110)
Creat: 1.28 mg/dL — ABNORMAL HIGH (ref 0.60–0.93)
Globulin: 3.4 g/dL (calc) (ref 1.9–3.7)
Glucose, Bld: 90 mg/dL (ref 65–99)
Potassium: 4.1 mmol/L (ref 3.5–5.3)
Sodium: 143 mmol/L (ref 135–146)
Total Bilirubin: 1.2 mg/dL (ref 0.2–1.2)
Total Protein: 7.9 g/dL (ref 6.1–8.1)

## 2020-02-11 LAB — T4, FREE: Free T4: 1.4 ng/dL (ref 0.8–1.8)

## 2020-02-11 LAB — CBC WITH DIFFERENTIAL/PLATELET
Absolute Monocytes: 486 cells/uL (ref 200–950)
Basophils Absolute: 32 cells/uL (ref 0–200)
Basophils Relative: 0.6 %
Eosinophils Absolute: 119 cells/uL (ref 15–500)
Eosinophils Relative: 2.2 %
HCT: 38.1 % (ref 35.0–45.0)
Hemoglobin: 12.3 g/dL (ref 11.7–15.5)
Lymphs Abs: 967 cells/uL (ref 850–3900)
MCH: 29.9 pg (ref 27.0–33.0)
MCHC: 32.3 g/dL (ref 32.0–36.0)
MCV: 92.5 fL (ref 80.0–100.0)
MPV: 10.9 fL (ref 7.5–12.5)
Monocytes Relative: 9 %
Neutro Abs: 3796 cells/uL (ref 1500–7800)
Neutrophils Relative %: 70.3 %
Platelets: 307 10*3/uL (ref 140–400)
RBC: 4.12 10*6/uL (ref 3.80–5.10)
RDW: 15.5 % — ABNORMAL HIGH (ref 11.0–15.0)
Total Lymphocyte: 17.9 %
WBC: 5.4 10*3/uL (ref 3.8–10.8)

## 2020-02-11 LAB — HEMOGLOBIN A1C
Hgb A1c MFr Bld: 5.3 % of total Hgb (ref <5.7)
Mean Plasma Glucose: 105 (calc)
eAG (mmol/L): 5.8 (calc)

## 2020-02-11 LAB — MICROALBUMIN / CREATININE URINE RATIO
Creatinine, Urine: 102 mg/dL (ref 20–275)
Microalb Creat Ratio: 35 mcg/mg creat — ABNORMAL HIGH (ref <30)
Microalb, Ur: 3.6 mg/dL

## 2020-02-11 LAB — TSH: TSH: 4.57 mIU/L — ABNORMAL HIGH (ref 0.40–4.50)

## 2020-02-13 LAB — URINE CULTURE
MICRO NUMBER:: 11049663
SPECIMEN QUALITY:: ADEQUATE

## 2020-02-15 ENCOUNTER — Telehealth (INDEPENDENT_AMBULATORY_CARE_PROVIDER_SITE_OTHER): Payer: Medicare Other | Admitting: "Endocrinology

## 2020-02-15 ENCOUNTER — Encounter: Payer: Self-pay | Admitting: "Endocrinology

## 2020-02-15 ENCOUNTER — Other Ambulatory Visit: Payer: Self-pay

## 2020-02-15 VITALS — BP 142/68 | HR 76 | Ht 66.0 in | Wt 230.0 lb

## 2020-02-15 DIAGNOSIS — E890 Postprocedural hypothyroidism: Secondary | ICD-10-CM | POA: Diagnosis not present

## 2020-02-15 MED ORDER — LEVOTHYROXINE SODIUM 150 MCG PO TABS
150.0000 ug | ORAL_TABLET | Freq: Every day | ORAL | 1 refills | Status: DC
Start: 2020-02-15 — End: 2020-08-03

## 2020-02-15 NOTE — Progress Notes (Signed)
02/15/2020                                Endocrinology Telehealth Visit Follow up Note -During COVID -19 Pandemic  This visit type was conducted  via telephone due to national recommendations for restrictions regarding the COVID-19 Pandemic  in an effort to limit this patient's exposure and mitigate transmission of the corona virus.   I connected with Danielle Howard on 02/15/2020   by telephone and verified that I am speaking with the correct person using two identifiers. Danielle Howard, 1950/12/01. she has verbally consented to this visit.  I was in my office and patient was in her residence. No other persons were with me during the encounter. All issues noted in this document were discussed and addressed. The format was not optimal for physical exam.    Subjective:    Patient ID: Danielle Howard, female    DOB: 02-17-1951, PCP [name], MD   Past Medical History:  Diagnosis Date  . Arthritis   . Complication of anesthesia    nausea  . Diabetes mellitus without complication ([place])   . Headache   . Hypertension   . Hypothyroidism   . Hypothyroidism   . Iritis   . Thyroid disease    Past Surgical History:  Procedure Laterality Date  . ABDOMINAL HYSTERECTOMY    . CATARACT EXTRACTION Left 2019  . CHOLECYSTECTOMY     02/2012  . COLONOSCOPY N/A 05/25/2013   Procedure: COLONOSCOPY;  Surgeon: Rogene Houston, MD;  [facility];  Service: Endoscopy;  Laterality: N/A;  830-moved to 1055 Ann to notify pt  . COLONOSCOPY N/A 06/21/2018   Procedure: COLONOSCOPY;  Surgeon: Rogene Houston, MD;  [facility];  Service: Endoscopy;  Laterality: N/A;  1020  . JOINT REPLACEMENT Bilateral    2012,2013-bilateral knees  . POLYPECTOMY  06/21/2018   Procedure: POLYPECTOMY;  Surgeon: Rogene Houston, MD;  [facility];  Service: Endoscopy;;  . THYROID SURGERY     Social History   Socioeconomic History  . Marital status: Single    Spouse name: Not on file  .  Number of children: Not on file  . Years of education: Not on file  . Highest education level: Not on file  Occupational History  . Not on file  Tobacco Use  . Smoking status: Never Smoker  . Smokeless tobacco: Never Used  Vaping Use  . Vaping Use: Never used  Substance and Sexual Activity  . Alcohol use: Yes    Comment: occasional  . Drug use: No  . Sexual activity: Yes  Other Topics Concern  . Not on file  Social History Narrative  . Not on file   Social Determinants of Health   Financial Resource Strain:   . Difficulty of Paying Living Expenses: Not on file  Food Insecurity:   . Worried About Charity fundraiser in the Last Year: Not on file  . Ran Out of Food in the Last Year: Not on file  Transportation Needs:   . Lack of Transportation (Medical): Not on file  . Lack of Transportation (Non-Medical): Not on file  Physical Activity:   . Days of Exercise per Week: Not on file  . Minutes of Exercise per Session: Not on file  Stress:   . Feeling of Stress : Not on file  Social Connections:   . Frequency  of Communication with Friends and Family: Not on file  . Frequency of Social Gatherings with Friends and Family: Not on file  . Attends Religious Services: Not on file  . Active Member of Clubs or Organizations: Not on file  . Attends Archivist Meetings: Not on file  . Marital Status: Not on file   Outpatient Encounter Medications as of 02/15/2020  Medication Sig  . Calcium Carbonate-Vit D-Min (CALCIUM 1200 PO) Take by mouth.  . brimonidine (ALPHAGAN P) 0.15 % ophthalmic solution Place 1 drop into the left eye 3 (three) times daily.  . cycloSPORINE modified (NEORAL) 50 MG capsule Take by mouth. Take (2) caps PO Q AM and (1) cap PO Q PM  . folic acid (FOLVITE) 1 MG tablet Take 1 mg by mouth daily.   Marland Kitchen latanoprost (XALATAN) 0.005 % ophthalmic solution Place 1 drop into the left eye at bedtime.  Marland Kitchen levothyroxine (SYNTHROID) 150 MCG tablet Take 1 tablet (150 mcg  total) by mouth daily before breakfast.  . methotrexate (RHEUMATREX) 2.5 MG tablet Take 25 mg by mouth every Wednesday. 10 tabs  . Multiple Vitamins-Minerals (MULTIVITAMIN WITH MINERALS) tablet Take 1 tablet by mouth daily.  . potassium chloride SA (KLOR-CON) 20 MEQ tablet TAKE 1 TABLET(20 MEQ) BY MOUTH TWICE DAILY (Patient taking differently: 20 mEq once. TAKE 1 TABLET(20 MEQ) BY MOUTH TWICE DAILY)  . tiZANidine (ZANAFLEX) 4 MG tablet Take 1 tablet (4 mg total) by mouth every 6 (six) hours as needed for muscle spasms.  Marland Kitchen torsemide (DEMADEX) 20 MG tablet TAKE 1 TO 2 TABLETS BY MOUTH DAILY AS NEEDED FOR SWELLING (Patient taking differently: TAKE 1 TO 2 TABLETS BY MOUTH DAILY  FOR SWELLING)  . verapamil (CALAN-SR) 240 MG CR tablet TAKE 1 TABLET(240 MG) BY MOUTH TWICE DAILY  . [DISCONTINUED] levothyroxine (SYNTHROID) 137 MCG tablet TAKE 1 TABLET(137 MCG) BY MOUTH DAILY   No facility-administered encounter medications on file as of 02/15/2020.   ALLERGIES: Allergies  Allergen Reactions  . Acetazolamide   . Dicloxacillin Other (See Comments)    GI upset   VACCINATION STATUS: Immunization History  Administered Date(s) Administered  . Fluad Quad(high Dose 65+) 01/13/2019  . Influenza Whole 02/09/2013  . Influenza, High Dose Seasonal PF 01/20/2017, 01/27/2018, 01/04/2020  . Influenza,inj,Quad PF,6+ Mos 01/15/2015, 01/25/2016  . Influenza-Unspecified 01/13/2014  . Moderna SARS-COVID-2 Vaccination 06/16/2019, 07/18/2019  . Pneumococcal Conjugate-13 01/15/2015  . Pneumococcal Polysaccharide-23 01/13/2014  . Tdap 10/02/2009  . Varicella 03/03/2010    HPI   Danielle Howard is a 69 year old female with medical history as above.  She is being seen in follow-up for postsurgical hypothyroidism, prediabetes, history of nodular goiter.   She is on levothyroxine 137 mcg po qam. she reports fatigue.  Her previsit labs showed above target TSH.patient reports consistency and compliance to her medications.     -She remains off of metformin for now.  She has lost 11 pounds since last visit.  Her recent A1c was 5.6%.    she has family hx of diabetes in her mother. she denies dysphagia, SOB, nor voice change.    Objective:    BP (!) 142/68   Pulse 76   Ht 5\' 6"  (1.676 m)   Wt 230 lb (104.3 kg)   BMI 37.12 kg/m   Wt Readings from Last 3 Encounters:  02/15/20 230 lb (104.3 kg)  02/10/20 230 lb (104.3 kg)  10/05/19 230 lb (104.3 kg)        CMP  Component Value Date/Time   NA 143 02/10/2020 0926   K 4.1 02/10/2020 0926   CL 104 02/10/2020 0926   CO2 30 02/10/2020 0926   GLUCOSE 90 02/10/2020 0926   BUN 21 02/10/2020 0926   CREATININE 1.28 (H) 02/10/2020 0926   CALCIUM 9.8 02/10/2020 0926   PROT 7.9 02/10/2020 0926   ALBUMIN 3.8 07/15/2016 0859   AST 22 02/10/2020 0926   ALT 10 02/10/2020 0926   ALKPHOS 90 07/15/2016 0859   BILITOT 1.2 02/10/2020 0926   GFRNONAA 41 (L) 03/28/2019 0744   GFRAA 48 (L) 03/28/2019 0744     Diabetic Labs (most recent): Lab Results  Component Value Date   HGBA1C 5.3 02/10/2020   HGBA1C 5.6 08/08/2019   HGBA1C 5.6 03/28/2019     Lipid Panel ( most recent) Lipid Panel     Component Value Date/Time   CHOL 176 02/10/2020 0926   TRIG 106 02/10/2020 0926   HDL 68 02/10/2020 0926   CHOLHDL 2.6 02/10/2020 0926   VLDL 17 07/15/2016 0859   LDLCALC 88 02/10/2020 0926    Results for KATALIA, CHOMA (MRN [id number]) as of 02/15/2020 17:26  Ref. Range 08/08/2019 10:09 02/10/2020 09:26  TSH Latest Ref Range: 0.40 - 4.50 mIU/L 1.09 4.57 (H)  T4,Free(Direct) Latest Ref Range: 0.8 - 1.8 ng/dL 1.4 1.4     Assessment & Plan:   1. Hypothyroidism: 2. Hashimoto's thyroiditis 3. Left sided hemithyroidectomy  Her previsit thyroid function tests are consistent with inadequate replacement with levothyroxine.  I discussed and increase her levothyroxine to 150 mcg p.o. daily before breakfast.     - We discussed about the correct intake of her thyroid  hormone, on empty stomach at fasting, with water, separated by at least 30 minutes from breakfast and other medications,  and separated by more than 4 hours from calcium, iron, multivitamins, acid reflux medications (PPIs). -Patient is made aware of the fact that thyroid hormone replacement is needed for life, dose to be adjusted by periodic monitoring of thyroid function tests.   Her last thyroid u/s showed normal size right lobe with no nodules.   Pre-diabetes -Her previsit labs show stable A1c of 5.6%.  She did have history of prediabetes before.  She will not need metformin intervention at this time, her renal function is improving.   - she  admits there is a room for improvement in her diet and drink choices. -  Suggestion is made for her to avoid simple carbohydrates  from her diet including Cakes, Sweet Desserts / Pastries, Ice Cream, Soda (diet and regular), Sweet Tea, Candies, Chips, Cookies, Sweet Pastries,  Store Bought Juices, Alcohol in Excess of  1-2 drinks a day, Artificial Sweeteners, Coffee Creamer, and "Sugar-free" Products. This will help patient to have stable blood glucose profile and potentially avoid unintended weight gain.   -She will need fasting lipid panel repeated in a year.  She is encouraged to keep her appointment with a nephrologist.  She is advised to avoid any over-the-counter pain medications.  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 Surgically absent left lobe of the thyroid.   - I advised patient to maintain close follow up with [name], MD for primary care needs.     - Time spent on this patient care encounter:  20 minutes of which 50% was spent in  counseling and the rest reviewing  her current and  previous labs / studies and medications  doses and developing a plan for long term care. Telitha Plath  participated in  the discussions, expressed understanding, and voiced agreement with the above plans.  All questions were answered to her satisfaction. she is encouraged to contact clinic should she have any questions or concerns prior to her return visit.   Follow up plan: Return for Keep Reg. Appt. with Pre-visit Labs.  Glade Lloyd, MD Phone: [phone]  Fax: [phone]  This note was partially dictated with voice recognition software. Similar sounding words can be transcribed inadequately or may not  be corrected upon review.  02/15/2020, 5:24 PM

## 2020-03-04 ENCOUNTER — Other Ambulatory Visit: Payer: Self-pay | Admitting: Family Medicine

## 2020-03-06 DIAGNOSIS — T8521XD Breakdown (mechanical) of intraocular lens, subsequent encounter: Secondary | ICD-10-CM | POA: Diagnosis not present

## 2020-03-06 DIAGNOSIS — H35033 Hypertensive retinopathy, bilateral: Secondary | ICD-10-CM | POA: Diagnosis not present

## 2020-03-06 DIAGNOSIS — H35372 Puckering of macula, left eye: Secondary | ICD-10-CM | POA: Diagnosis not present

## 2020-03-06 DIAGNOSIS — E1136 Type 2 diabetes mellitus with diabetic cataract: Secondary | ICD-10-CM | POA: Diagnosis not present

## 2020-03-06 DIAGNOSIS — H2511 Age-related nuclear cataract, right eye: Secondary | ICD-10-CM | POA: Diagnosis not present

## 2020-03-06 DIAGNOSIS — Z961 Presence of intraocular lens: Secondary | ICD-10-CM | POA: Diagnosis not present

## 2020-03-06 DIAGNOSIS — H20042 Secondary noninfectious iridocyclitis, left eye: Secondary | ICD-10-CM | POA: Diagnosis not present

## 2020-03-06 DIAGNOSIS — Z79899 Other long term (current) drug therapy: Secondary | ICD-10-CM | POA: Diagnosis not present

## 2020-03-20 DIAGNOSIS — H20042 Secondary noninfectious iridocyclitis, left eye: Secondary | ICD-10-CM | POA: Diagnosis not present

## 2020-03-20 DIAGNOSIS — E1136 Type 2 diabetes mellitus with diabetic cataract: Secondary | ICD-10-CM | POA: Diagnosis not present

## 2020-03-20 DIAGNOSIS — Z79899 Other long term (current) drug therapy: Secondary | ICD-10-CM | POA: Diagnosis not present

## 2020-03-20 DIAGNOSIS — H35033 Hypertensive retinopathy, bilateral: Secondary | ICD-10-CM | POA: Diagnosis not present

## 2020-03-20 DIAGNOSIS — T8521XD Breakdown (mechanical) of intraocular lens, subsequent encounter: Secondary | ICD-10-CM | POA: Diagnosis not present

## 2020-03-20 DIAGNOSIS — H2511 Age-related nuclear cataract, right eye: Secondary | ICD-10-CM | POA: Diagnosis not present

## 2020-03-20 DIAGNOSIS — H35372 Puckering of macula, left eye: Secondary | ICD-10-CM | POA: Diagnosis not present

## 2020-03-21 DIAGNOSIS — Z23 Encounter for immunization: Secondary | ICD-10-CM | POA: Diagnosis not present

## 2020-03-26 ENCOUNTER — Other Ambulatory Visit: Payer: Self-pay | Admitting: *Deleted

## 2020-03-26 DIAGNOSIS — I739 Peripheral vascular disease, unspecified: Secondary | ICD-10-CM

## 2020-04-02 DIAGNOSIS — E890 Postprocedural hypothyroidism: Secondary | ICD-10-CM | POA: Diagnosis not present

## 2020-04-03 DIAGNOSIS — E1136 Type 2 diabetes mellitus with diabetic cataract: Secondary | ICD-10-CM | POA: Diagnosis not present

## 2020-04-03 DIAGNOSIS — T8521XD Breakdown (mechanical) of intraocular lens, subsequent encounter: Secondary | ICD-10-CM | POA: Diagnosis not present

## 2020-04-03 DIAGNOSIS — H20042 Secondary noninfectious iridocyclitis, left eye: Secondary | ICD-10-CM | POA: Diagnosis not present

## 2020-04-03 DIAGNOSIS — H35033 Hypertensive retinopathy, bilateral: Secondary | ICD-10-CM | POA: Diagnosis not present

## 2020-04-03 DIAGNOSIS — H35372 Puckering of macula, left eye: Secondary | ICD-10-CM | POA: Diagnosis not present

## 2020-04-03 DIAGNOSIS — Z79899 Other long term (current) drug therapy: Secondary | ICD-10-CM | POA: Diagnosis not present

## 2020-04-03 DIAGNOSIS — H2511 Age-related nuclear cataract, right eye: Secondary | ICD-10-CM | POA: Diagnosis not present

## 2020-04-03 DIAGNOSIS — Z961 Presence of intraocular lens: Secondary | ICD-10-CM | POA: Diagnosis not present

## 2020-04-03 LAB — T4, FREE: Free T4: 1.4 ng/dL (ref 0.8–1.8)

## 2020-04-03 LAB — TSH: TSH: 0.55 mIU/L (ref 0.40–4.50)

## 2020-04-04 ENCOUNTER — Other Ambulatory Visit: Payer: Self-pay

## 2020-04-04 ENCOUNTER — Encounter: Payer: Self-pay | Admitting: Vascular Surgery

## 2020-04-04 ENCOUNTER — Ambulatory Visit (INDEPENDENT_AMBULATORY_CARE_PROVIDER_SITE_OTHER): Payer: Medicare Other | Admitting: Vascular Surgery

## 2020-04-04 ENCOUNTER — Ambulatory Visit (HOSPITAL_COMMUNITY)
Admission: RE | Admit: 2020-04-04 | Discharge: 2020-04-04 | Disposition: A | Discharge status: Home / Self Care | Payer: Medicare Other | Source: Ambulatory Visit | Attending: Vascular Surgery | Admitting: Vascular Surgery

## 2020-04-04 VITALS — BP 138/66 | HR 92 | Temp 97.5°F | Resp 18 | Ht 66.5 in | Wt 230.0 lb

## 2020-04-04 DIAGNOSIS — I890 Lymphedema, not elsewhere classified: Secondary | ICD-10-CM

## 2020-04-04 DIAGNOSIS — I739 Peripheral vascular disease, unspecified: Secondary | ICD-10-CM

## 2020-04-04 NOTE — Progress Notes (Signed)
Referring Physician: Dr Mardelle Matte  Patient name: Danielle Howard MRN: [id number] DOB: April 04, 1951 Sex: female  REASON FOR CONSULT: Bilateral leg swelling  HPI: Danielle Howard is a 69 y.o. female who was last seen 3 years ago for a leg swelling.  At that point she had no evidence of venous reflux.  It was thought that most of her swelling was related to lymphedema.  She states that over the last 3 years her leg swelling has worsened to the point where she cannot get shoes on sometimes.  She was recently given thigh-high compression stockings by Dr. Mardelle Matte to improve her symptoms.  She did have bilateral knee replacement in the past.  Both of her legs became more swollen after this in 2013.  She has no history of DVT.  She has not had nonhealing wounds or ulcerations.  However she has noticed that the skin has become darker over the last several years.  Past Medical History:  Diagnosis Date  . Arthritis   . Complication of anesthesia    nausea  . Diabetes mellitus without complication ([place])   . Headache   . Hypertension   . Hypothyroidism   . Hypothyroidism   . Iritis   . Thyroid disease    Past Surgical History:  Procedure Laterality Date  . ABDOMINAL HYSTERECTOMY    . CATARACT EXTRACTION Left 2019  . CHOLECYSTECTOMY     02/2012  . COLONOSCOPY N/A 05/25/2013   Procedure: COLONOSCOPY;  Surgeon: Rogene Houston, MD;  [facility];  Service: Endoscopy;  Laterality: N/A;  830-moved to 1055 Ann to notify pt  . COLONOSCOPY N/A 06/21/2018   Procedure: COLONOSCOPY;  Surgeon: Rogene Houston, MD;  [facility];  Service: Endoscopy;  Laterality: N/A;  1020  . JOINT REPLACEMENT Bilateral    2012,2013-bilateral knees  . POLYPECTOMY  06/21/2018   Procedure: POLYPECTOMY;  Surgeon: Rogene Houston, MD;  [facility];  Service: Endoscopy;;  . THYROID SURGERY      Family History  Problem Relation Age of Onset  . Arthritis Mother   . Heart disease Mother   .  Hypertension Mother   . Arthritis Father   . Hyperlipidemia Father   . HIV/AIDS Brother     SOCIAL HISTORY: Social History   Socioeconomic History  . Marital status: Single    Spouse name: Not on file  . Number of children: Not on file  . Years of education: Not on file  . Highest education level: Not on file  Occupational History  . Not on file  Tobacco Use  . Smoking status: Never Smoker  . Smokeless tobacco: Never Used  Vaping Use  . Vaping Use: Never used  Substance and Sexual Activity  . Alcohol use: Yes    Comment: occasional  . Drug use: No  . Sexual activity: Yes  Other Topics Concern  . Not on file  Social History Narrative  . Not on file   Social Determinants of Health   Financial Resource Strain:   . Difficulty of Paying Living Expenses: Not on file  Food Insecurity:   . Worried About Charity fundraiser in the Last Year: Not on file  . Ran Out of Food in the Last Year: Not on file  Transportation Needs:   . Lack of Transportation (Medical): Not on file  . Lack of Transportation (Non-Medical): Not on file  Physical Activity:   . Days of Exercise per Week: Not on file  .  Minutes of Exercise per Session: Not on file  Stress:   . Feeling of Stress : Not on file  Social Connections:   . Frequency of Communication with Friends and Family: Not on file  . Frequency of Social Gatherings with Friends and Family: Not on file  . Attends Religious Services: Not on file  . Active Member of Clubs or Organizations: Not on file  . Attends Archivist Meetings: Not on file  . Marital Status: Not on file  Intimate Partner Violence:   . Fear of Current or Ex-Partner: Not on file  . Emotionally Abused: Not on file  . Physically Abused: Not on file  . Sexually Abused: Not on file    Allergies  Allergen Reactions  . Acetazolamide   . Dicloxacillin Other (See Comments)    GI upset    Current Outpatient Medications  Medication Sig Dispense Refill  .  acyclovir (ZOVIRAX) 800 MG tablet Take by mouth 2 (two) times daily.    . brimonidine (ALPHAGAN P) 0.15 % ophthalmic solution Place 1 drop into the left eye 3 (three) times daily. 5 mL 12  . Calcium Carbonate-Vit D-Min (CALCIUM 1200 PO) Take by mouth.    . cycloSPORINE modified (NEORAL) 50 MG capsule Take by mouth. Take (2) caps PO Q AM and (1) cap PO Q PM    . dorzolamide-timolol (COSOPT) 22.3-6.8 MG/ML ophthalmic solution 2 (two) times daily. Left eye only    . folic acid (FOLVITE) 1 MG tablet Take 1 mg by mouth daily.     Marland Kitchen latanoprost (XALATAN) 0.005 % ophthalmic solution Place 1 drop into the left eye at bedtime. 2.5 mL 12  . levothyroxine (SYNTHROID) 150 MCG tablet Take 1 tablet (150 mcg total) by mouth daily before breakfast. 90 tablet 1  . methazolamide (NEPTAZANE) 50 MG tablet Take by mouth 2 (two) times daily.    . methotrexate (RHEUMATREX) 2.5 MG tablet Take 25 mg by mouth every Wednesday. 10 tabs    . Multiple Vitamins-Minerals (MULTIVITAMIN WITH MINERALS) tablet Take 1 tablet by mouth daily.    . potassium chloride SA (KLOR-CON) 20 MEQ tablet TAKE 1 TABLET(20 MEQ) BY MOUTH TWICE DAILY (Patient taking differently: 20 mEq once. TAKE 1 TABLET(20 MEQ) BY MOUTH TWICE DAILY) 180 tablet 3  . torsemide (DEMADEX) 20 MG tablet TAKE 1 TO 2 TABLETS BY MOUTH DAILY AS NEEDED FOR SWELLING (Patient taking differently: TAKE 1 TO 2 TABLETS BY MOUTH DAILY  FOR SWELLING) 180 tablet 2  . verapamil (CALAN-SR) 240 MG CR tablet TAKE 1 TABLET(240 MG) BY MOUTH TWICE DAILY 180 tablet 1  . tiZANidine (ZANAFLEX) 4 MG tablet Take 1 tablet (4 mg total) by mouth every 6 (six) hours as needed for muscle spasms. (Patient not taking: Reported on 04/04/2020) 20 tablet 0   No current facility-administered medications for this visit.    ROS:   General:  No weight loss, Fever, chills  HEENT: No recent headaches, no nasal bleeding, no visual changes, no sore throat  Neurologic: No dizziness, blackouts, seizures. No  recent symptoms of stroke or mini- stroke. No recent episodes of slurred speech, or temporary blindness.  Cardiac: No recent episodes of chest pain/pressure, no shortness of breath at rest.  No shortness of breath with exertion.  Denies history of atrial fibrillation or irregular heartbeat  Vascular: No history of rest pain in feet.  No history of claudication.  No history of non-healing ulcer, No history of DVT   Pulmonary: No home oxygen, no  productive cough, no hemoptysis,  No asthma or wheezing  Musculoskeletal:  [X]  Arthritis, [X]  Low back pain,  [X]  Joint pain  Hematologic:No history of hypercoagulable state.  No history of easy bleeding.  No history of anemia  Gastrointestinal: No hematochezia or melena,  No gastroesophageal reflux, no trouble swallowing  Urinary: [ ]  chronic Kidney disease, [ ]  on HD - [ ]  MWF or [ ]  TTHS, [ ]  Burning with urination, [ ]  Frequent urination, [ ]  Difficulty urinating;   Skin: No rashes  Psychological: No history of anxiety,  No history of depression   Physical Examination  Vitals:   04/04/20 1500  BP: 138/66  Pulse: 92  Resp: 18  Temp: (!) 97.5 F (36.4 C)  TempSrc: Temporal  SpO2: 100%  Weight: 230 lb (104.3 kg)  Height: 5' 6.5" (1.689 m)    Body mass index is 36.57 kg/m.  General:  Alert and oriented, no acute distress HEENT: Normal Neck: No JVD Cardiac: Regular Rate and Rhythm  Skin: No rash, woody thickening of the skin from the ankle up to the knee level with some peau d'orange nonpitting, squaring of toes bilaterally see images below, bilateral hemosiderin staining right pretibial and inner calf left inner calf just above the ankle       Extremity Pulses:  2+ dorsalis pedis, posterior tibial pulses bilaterally Musculoskeletal: No deformity  Neurologic: Upper and lower extremity motor 5/5 and symmetric  DATA:  Patient had bilateral lower extremity venous duplex today for reflux.  This did not show any reflux in either  lower extremity.  There was no deep or superficial venous reflux.  ASSESSMENT: Chronic lower extremity edema secondary most likely to lymphedema.  No obvious vascular component as she has had a normal arterial study in the past and several normal venous studies.   PLAN: Patient will continue to wear her thigh-high compression stockings and elevate her legs for symptomatic relief.  She has stage II swelling with nonpitting edema secondary to fibrosis of tissues.  Based on the patient's history and physical exam I believe that the patient would benefit from pneumatic compression.  I will see her back in 1 month's time if she has not had alleviation of her symptoms with compression alone we will pursue this next.  Ruta Hinds, MD [facility] Office: [phone]

## 2020-04-06 ENCOUNTER — Ambulatory Visit (INDEPENDENT_AMBULATORY_CARE_PROVIDER_SITE_OTHER): Payer: Medicare Other | Admitting: Nurse Practitioner

## 2020-04-06 ENCOUNTER — Other Ambulatory Visit: Payer: Self-pay

## 2020-04-06 ENCOUNTER — Ambulatory Visit: Payer: Medicare Other | Admitting: "Endocrinology

## 2020-04-06 ENCOUNTER — Encounter: Payer: Self-pay | Admitting: Nurse Practitioner

## 2020-04-06 VITALS — BP 146/72 | HR 90 | Ht 66.5 in | Wt 228.0 lb

## 2020-04-06 DIAGNOSIS — E890 Postprocedural hypothyroidism: Secondary | ICD-10-CM

## 2020-04-06 NOTE — Patient Instructions (Signed)

## 2020-04-06 NOTE — Progress Notes (Signed)
04/06/2020                                Endocrinology Telehealth Visit Follow up Note -During COVID -19 Pandemic  This visit type was conducted  via telephone due to national recommendations for restrictions regarding the COVID-19 Pandemic  in an effort to limit this patient's exposure and mitigate transmission of the corona virus.   I connected with Danielle Howard on 04/06/2020   by telephone and verified that I am speaking with the correct person using two identifiers. Danielle Howard, 11/07/1950. she has verbally consented to this visit.  I was in my office and patient was in her residence. No other persons were with me during the encounter. All issues noted in this document were discussed and addressed. The format was not optimal for physical exam.    Subjective:    Patient ID: Danielle Howard, female    DOB: 1951/01/10, PCP [name], MD   Past Medical History:  Diagnosis Date  . Arthritis   . Complication of anesthesia    nausea  . Diabetes mellitus without complication ([place])   . Headache   . Hypertension   . Hypothyroidism   . Hypothyroidism   . Iritis   . Thyroid disease    Past Surgical History:  Procedure Laterality Date  . ABDOMINAL HYSTERECTOMY    . CATARACT EXTRACTION Left 2019  . CHOLECYSTECTOMY     02/2012  . COLONOSCOPY N/A 05/25/2013   Procedure: COLONOSCOPY;  Surgeon: Rogene Houston, MD;  [facility];  Service: Endoscopy;  Laterality: N/A;  830-moved to 1055 Ann to notify pt  . COLONOSCOPY N/A 06/21/2018   Procedure: COLONOSCOPY;  Surgeon: Rogene Houston, MD;  Location: [facility];  Service: Endoscopy;  Laterality: N/A;  1020  . JOINT REPLACEMENT Bilateral    2012,2013-bilateral knees  . POLYPECTOMY  06/21/2018   Procedure: POLYPECTOMY;  Surgeon: Rogene Houston, MD;  Location: [facility];  Service: Endoscopy;;  . THYROID SURGERY     Social History   Socioeconomic History  . Marital status: Single    Spouse name: Not on file  .  Number of children: Not on file  . Years of education: Not on file  . Highest education level: Not on file  Occupational History  . Not on file  Tobacco Use  . Smoking status: Never Smoker  . Smokeless tobacco: Never Used  Vaping Use  . Vaping Use: Never used  Substance and Sexual Activity  . Alcohol use: Yes    Comment: occasional  . Drug use: No  . Sexual activity: Yes  Other Topics Concern  . Not on file  Social History Narrative  . Not on file   Social Determinants of Health   Financial Resource Strain:   . Difficulty of Paying Living Expenses: Not on file  Food Insecurity:   . Worried About Charity fundraiser in the Last Year: Not on file  . Ran Out of Food in the Last Year: Not on file  Transportation Needs:   . Lack of Transportation (Medical): Not on file  . Lack of Transportation (Non-Medical): Not on file  Physical Activity:   . Days of Exercise per Week: Not on file  . Minutes of Exercise per Session: Not on file  Stress:   . Feeling of Stress : Not on file  Social Connections:   . Frequency  of Communication with Friends and Family: Not on file  . Frequency of Social Gatherings with Friends and Family: Not on file  . Attends Religious Services: Not on file  . Active Member of Clubs or Organizations: Not on file  . Attends Archivist Meetings: Not on file  . Marital Status: Not on file   Outpatient Encounter Medications as of 04/06/2020  Medication Sig  . acyclovir (ZOVIRAX) 800 MG tablet Take by mouth 2 (two) times daily.  . brimonidine (ALPHAGAN P) 0.15 % ophthalmic solution Place 1 drop into the left eye 3 (three) times daily.  . Calcium Carbonate-Vit D-Min (CALCIUM 1200 PO) Take by mouth.  . cycloSPORINE modified (NEORAL) 50 MG capsule Take by mouth. Take (2) caps PO Q AM and (1) cap PO Q PM  . dorzolamide-timolol (COSOPT) 22.3-6.8 MG/ML ophthalmic solution 2 (two) times daily. Left eye only  . folic acid (FOLVITE) 1 MG tablet Take 1 mg by  mouth daily.   Marland Kitchen latanoprost (XALATAN) 0.005 % ophthalmic solution Place 1 drop into the left eye at bedtime.  Marland Kitchen levothyroxine (SYNTHROID) 150 MCG tablet Take 1 tablet (150 mcg total) by mouth daily before breakfast.  . LUMIGAN 0.01 % SOLN Place 1 drop into the left eye at bedtime.  . methazolamide (NEPTAZANE) 50 MG tablet Take by mouth 2 (two) times daily.  . methotrexate (RHEUMATREX) 2.5 MG tablet Take 25 mg by mouth every Wednesday. 10 tabs  . Multiple Vitamins-Minerals (MULTIVITAMIN WITH MINERALS) tablet Take 1 tablet by mouth daily.  . potassium chloride SA (KLOR-CON) 20 MEQ tablet TAKE 1 TABLET(20 MEQ) BY MOUTH TWICE DAILY (Patient taking differently: 20 mEq once. TAKE 1 TABLET(20 MEQ) BY MOUTH TWICE DAILY)  . tiZANidine (ZANAFLEX) 4 MG tablet Take 1 tablet (4 mg total) by mouth every 6 (six) hours as needed for muscle spasms.  Marland Kitchen torsemide (DEMADEX) 20 MG tablet TAKE 1 TO 2 TABLETS BY MOUTH DAILY AS NEEDED FOR SWELLING (Patient taking differently: TAKE 1 TO 2 TABLETS BY MOUTH DAILY  FOR SWELLING)  . verapamil (CALAN-SR) 240 MG CR tablet TAKE 1 TABLET(240 MG) BY MOUTH TWICE DAILY   No facility-administered encounter medications on file as of 04/06/2020.   ALLERGIES: Allergies  Allergen Reactions  . Acetazolamide   . Dicloxacillin Other (See Comments)    GI upset   VACCINATION STATUS: Immunization History  Administered Date(s) Administered  . Fluad Quad(high Dose 65+) 01/13/2019  . Influenza Whole 02/09/2013  . Influenza, High Dose Seasonal PF 01/20/2017, 01/27/2018, 01/04/2020  . Influenza,inj,Quad PF,6+ Mos 01/15/2015, 01/25/2016  . Influenza-Unspecified 01/13/2014  . Moderna SARS-COVID-2 Vaccination 06/16/2019, 07/18/2019  . Pneumococcal Conjugate-13 01/15/2015  . Pneumococcal Polysaccharide-23 01/13/2014  . Tdap 10/02/2009  . Varicella 03/03/2010    Thyroid Problem Presents for follow-up visit. Patient reports no anxiety, cold intolerance, constipation, depressed mood,  diarrhea, fatigue, hair loss, heat intolerance, palpitations, tremors, weight gain or weight loss. The symptoms have been stable.    She is on levothyroxine150 mcg po qam. She reports improvement in her previous symptoms of fatigue.  She is currently assisting her elderly mother and father who live across the street from her.   she denies dysphagia, SOB, nor voice change.   Review of systems  Constitutional: + Minimally fluctuating body weight,  current Body mass index is 36.25 kg/m. , no fatigue, no subjective hyperthermia, no subjective hypothermia Eyes: no blurry vision, no xerophthalmia, currently undergoing ophthalmology work up for glaucoma ENT: no sore throat, no nodules palpated in throat,  no dysphagia/odynophagia, no hoarseness Cardiovascular: no chest pain, no shortness of breath, no palpitations, no leg swelling Respiratory: no cough, no shortness of breath Gastrointestinal: no nausea/vomiting/diarrhea Musculoskeletal: no muscle/joint aches Skin: no rashes, no hyperemia Neurological: no tremors, no numbness, no tingling, no dizziness Psychiatric: no depression, no anxiety   Objective:    BP (!) 146/72 (BP Location: Right Arm, Patient Position: Sitting)   Pulse 90   Ht 5' 6.5" (1.689 m)   Wt 228 lb (103.4 kg)   BMI 36.25 kg/m   Wt Readings from Last 3 Encounters:  04/06/20 228 lb (103.4 kg)  04/04/20 230 lb (104.3 kg)  02/15/20 230 lb (104.3 kg)    BP Readings from Last 3 Encounters:  04/06/20 (!) 146/72  04/04/20 138/66  02/15/20 (!) 142/68     Physical Exam- Limited  Constitutional:  Body mass index is 36.25 kg/m. , not in acute distress, normal state of mind Eyes:  EOMI, no exophthalmos Neck: Supple Cardiovascular: RRR, no murmers, rubs, or gallops, no edema Respiratory: Adequate breathing efforts, no crackles, rales, rhonchi, or wheezing Musculoskeletal: no gross deformities, strength intact in all four extremities, no gross restriction of joint  movements Skin:  no rashes, no hyperemia Neurological: no tremor with outstretched hands    CMP     Component Value Date/Time   NA 143 02/10/2020 0926   K 4.1 02/10/2020 0926   CL 104 02/10/2020 0926   CO2 30 02/10/2020 0926   GLUCOSE 90 02/10/2020 0926   BUN 21 02/10/2020 0926   CREATININE 1.28 (H) 02/10/2020 0926   CALCIUM 9.8 02/10/2020 0926   PROT 7.9 02/10/2020 0926   ALBUMIN 3.8 07/15/2016 0859   AST 22 02/10/2020 0926   ALT 10 02/10/2020 0926   ALKPHOS 90 07/15/2016 0859   BILITOT 1.2 02/10/2020 0926   GFRNONAA 41 (L) 03/28/2019 0744   GFRAA 48 (L) 03/28/2019 0744     Diabetic Labs (most recent): Lab Results  Component Value Date   HGBA1C 5.3 02/10/2020   HGBA1C 5.6 08/08/2019   HGBA1C 5.6 03/28/2019     Lipid Panel ( most recent) Lipid Panel     Component Value Date/Time   CHOL 176 02/10/2020 0926   TRIG 106 02/10/2020 0926   HDL 68 02/10/2020 0926   CHOLHDL 2.6 02/10/2020 0926   VLDL 17 07/15/2016 0859   LDLCALC 88 02/10/2020 0926    Results for DARIUS, FILLINGIM (MRN [id number]) as of 02/15/2020 17:26  Ref. Range 08/08/2019 10:09 02/10/2020 09:26  TSH Latest Ref Range: 0.40 - 4.50 mIU/L 1.09 4.57 (H)  T4,Free(Direct) Latest Ref Range: 0.8 - 1.8 ng/dL 1.4 1.4     Assessment & Plan:   1. Hypothyroidism r/t Hashimoto's thyroiditis with left Hemithyroidectomy: -Her previsit thyroid function tests are consistent with appropriate hormone replacement.  She is advised to continue Levothyroxine 150 mcg po daily before breakfast.   - We discussed about the correct intake of her thyroid hormone, on empty stomach at fasting, with water, separated by at least 30 minutes from breakfast and other medications,  and separated by more than 4 hours from calcium, iron, multivitamins, acid reflux medications (PPIs). -Patient is made aware of the fact that thyroid hormone replacement is needed for life, dose to be adjusted by periodic monitoring of thyroid function  tests.  Her last thyroid u/s showed normal size right lobe with no nodules.- no need for additional follow up at this time.   - I advised patient to maintain close follow up with  Buelah Manis, Modena Nunnery, MD for primary care needs.      - Time spent on this patient care encounter:  20 minutes of which 50% was spent in  counseling and the rest reviewing  her current and  previous labs / studies and medications  doses and developing a plan for long term care. Zaryah Seckel  participated in the discussions, expressed understanding, and voiced agreement with the above plans.  All questions were answered to her satisfaction. she is encouraged to contact clinic should she have any questions or concerns prior to her return visit.   Follow up plan: Return in about 6 months (around 10/05/2020) for Thyroid follow up, Previsit labs.  Rayetta Pigg, [facility] [address] [place], [place] [address] Phone: [phone] Fax: [phone]   04/06/2020, 11:20 AM

## 2020-04-20 DIAGNOSIS — H209 Unspecified iridocyclitis: Secondary | ICD-10-CM | POA: Diagnosis not present

## 2020-04-20 DIAGNOSIS — H4042X3 Glaucoma secondary to eye inflammation, left eye, severe stage: Secondary | ICD-10-CM | POA: Diagnosis not present

## 2020-05-02 ENCOUNTER — Other Ambulatory Visit: Payer: Self-pay

## 2020-05-02 ENCOUNTER — Ambulatory Visit (INDEPENDENT_AMBULATORY_CARE_PROVIDER_SITE_OTHER): Payer: Medicare Other | Admitting: Nurse Practitioner

## 2020-05-02 ENCOUNTER — Encounter: Payer: Self-pay | Admitting: Nurse Practitioner

## 2020-05-02 VITALS — BP 138/72 | HR 98 | Temp 98.0°F

## 2020-05-02 DIAGNOSIS — R69 Illness, unspecified: Secondary | ICD-10-CM | POA: Diagnosis not present

## 2020-05-02 DIAGNOSIS — R197 Diarrhea, unspecified: Secondary | ICD-10-CM

## 2020-05-02 NOTE — Assessment & Plan Note (Addendum)
Acute, ongoing x 1 month.  Unclear etiology.  Without loss of appetite, fever, nausea/vomiting, blood in stool, no red flags in history or on examination.  Will check stool culture, O&P, C. Diff, and COVID test to rule out.  Encouraged plenty of hydration with water and/or electrolyte solution low in sugar. With nausea/vomiting and unable to keep fluids down, go to ER.

## 2020-05-02 NOTE — Progress Notes (Signed)
Subjective:    Patient ID: Danielle Howard, female    DOB: [birth year], 69 y.o.   MRN: [id number]  HPI: Danielle Howard is a 69 y.o. female presenting for diarrhea.  Chief Complaint  Patient presents with  . Illness    Having diarrhea, dizziness, and body pain for about a month. Has been vaccinated and boostered. Taking tylenol for the pain. Did have fall this morning while getting out of bed.   GASTROENTERITIS Duration: ~ 1 month Diarrhea: yes non-bloody, with some formed pieces of stool  Episodes of diarrhea/day: "all day"  Description of diarrhea: watery with some formed, stool pieces Nausea: no Vomiting: no Episodes of vomit/day: 0 Description of vomiting: n/a Abdominal pain: yes; left side Fever: no Decreased appetite: no Loss of taste or smell: yes Tolerating liquids: yes Foreign travel: no Relevant dietary history: n/a Similar illness in contacts: no Recent antibiotic use: no Status: stable Treatments attempted: Immodium AD, Tylenol  Allergies  Allergen Reactions  . Acetazolamide   . Dicloxacillin Other (See Comments)    GI upset    Outpatient Encounter Medications as of 05/02/2020  Medication Sig  . acyclovir (ZOVIRAX) 800 MG tablet Take by mouth 2 (two) times daily.  . brimonidine (ALPHAGAN) 0.15 % ophthalmic solution Place 1 drop into the left eye 3 (three) times daily.  . Calcium Carbonate-Vit D-Min (CALCIUM 1200 PO) Take by mouth.  . cycloSPORINE modified (NEORAL) 50 MG capsule Take by mouth. Take (2) caps PO Q AM and (1) cap PO Q PM  . dorzolamide-timolol (COSOPT) 22.3-6.8 MG/ML ophthalmic solution 2 (two) times daily. Left eye only  . folic acid (FOLVITE) 1 MG tablet Take 1 mg by mouth daily.   Marland Kitchen latanoprost (XALATAN) 0.005 % ophthalmic solution Place 1 drop into the left eye at bedtime.  Marland Kitchen levothyroxine (SYNTHROID) 150 MCG tablet Take 1 tablet (150 mcg total) by mouth daily before breakfast.  . LUMIGAN 0.01 % SOLN Place 1 drop into the left eye at bedtime.   . methazolamide (NEPTAZANE) 50 MG tablet Take by mouth 2 (two) times daily.  . methotrexate (RHEUMATREX) 2.5 MG tablet Take 25 mg by mouth every Wednesday. 10 tabs  . Multiple Vitamins-Minerals (MULTIVITAMIN WITH MINERALS) tablet Take 1 tablet by mouth daily.  . potassium chloride SA (KLOR-CON) 20 MEQ tablet TAKE 1 TABLET(20 MEQ) BY MOUTH TWICE DAILY (Patient taking differently: 20 mEq once. TAKE 1 TABLET(20 MEQ) BY MOUTH TWICE DAILY)  . tiZANidine (ZANAFLEX) 4 MG tablet Take 1 tablet (4 mg total) by mouth every 6 (six) hours as needed for muscle spasms.  Marland Kitchen torsemide (DEMADEX) 20 MG tablet TAKE 1 TO 2 TABLETS BY MOUTH DAILY AS NEEDED FOR SWELLING (Patient taking differently: TAKE 1 TO 2 TABLETS BY MOUTH DAILY  FOR SWELLING)  . verapamil (CALAN-SR) 240 MG CR tablet TAKE 1 TABLET(240 MG) BY MOUTH TWICE DAILY   No facility-administered encounter medications on file as of 05/02/2020.    Patient Active Problem List   Diagnosis Date Noted  . Diarrhea 05/02/2020  . Chronic kidney disease (CKD), stage III (moderate) ([facility]) 04/04/2019  . Gout 10/18/2018  . Mixed hyperlipidemia 07/30/2018  . Hx of colonic polyps 05/20/2018  . Acute iritis, left eye 04/07/2018  . Macular pucker, left eye 12/29/2017  . Nuclear sclerotic cataract of right eye 12/29/2017  . Uveitis of left eye 12/29/2017  . PVD (peripheral vascular disease) ([facility]) 11/24/2016  . Osteoarthritis, hand 10/01/2016  . Hypothyroidism 01/23/2016  . Post menopausal syndrome 09/05/2013  .  Prediabetes 03/07/2013  . Hashimoto's thyroiditis 01/03/2013  . Peripheral edema 01/03/2013  . Obesity 01/03/2013  . Essential hypertension, benign 01/03/2013    Past Medical History:  Diagnosis Date  . Arthritis   . Complication of anesthesia    nausea  . Diabetes mellitus without complication ([place])   . Headache   . Hypertension   . Hypothyroidism   . Hypothyroidism   . Iritis   . Thyroid disease     Relevant past medical, surgical, family  and social history reviewed and updated as indicated. Interim medical history since our last visit reviewed.  Review of Systems  Constitutional: Negative.  Negative for activity change, appetite change, fatigue and fever.  Respiratory: Negative.   Cardiovascular: Negative.   Gastrointestinal: Positive for abdominal pain and diarrhea. Negative for abdominal distention, anal bleeding, blood in stool, constipation, nausea and vomiting.  Musculoskeletal: Negative.   Skin: Negative.   Neurological: Negative.   Psychiatric/Behavioral: Negative.     Per HPI unless specifically indicated above     Objective:    BP 138/72   Pulse 98   Temp 98 F (36.7 C)   Wt Readings from Last 3 Encounters:  04/06/20 228 lb (103.4 kg)  04/04/20 230 lb (104.3 kg)  02/15/20 230 lb (104.3 kg)    Physical Exam Vitals and nursing note reviewed.  Constitutional:      General: She is not in acute distress.    Appearance: Normal appearance. She is obese. She is not toxic-appearing.  HENT:     Head: Normocephalic and atraumatic.     Right Ear: External ear normal.     Left Ear: External ear normal.     Nose: Nose normal. No congestion.  Eyes:     General: No scleral icterus.       Right eye: No discharge.        Left eye: No discharge.     Extraocular Movements: Extraocular movements intact.  Cardiovascular:     Heart sounds: Normal heart sounds.  Pulmonary:     Effort: Pulmonary effort is normal. No respiratory distress.     Breath sounds: Normal breath sounds. No wheezing or rhonchi.  Abdominal:     General: Abdomen is flat. Bowel sounds are normal. There is no distension.     Palpations: Abdomen is soft. There is no mass.     Tenderness: There is no abdominal tenderness. There is no right CVA tenderness, left CVA tenderness or guarding.     Hernia: No hernia is present.  Musculoskeletal:     Right lower leg: Edema present.     Left lower leg: Edema present.  Skin:    General: Skin is warm  and dry.     Coloration: Skin is not jaundiced or pale.     Findings: No erythema.  Neurological:     Mental Status: She is alert and oriented to person, place, and time.     Motor: No weakness.     Gait: Gait normal.  Psychiatric:        Mood and Affect: Mood normal.        Behavior: Behavior normal.        Thought Content: Thought content normal.        Judgment: Judgment normal.        Assessment & Plan:   Problem List Items Addressed This Visit      Other   Diarrhea    Acute, ongoing x 1 month.  Unclear etiology.  Without  loss of appetite, fever, nausea/vomiting, blood in stool, no red flags in history or on examination.  Will check stool culture, O&P, C. Diff, and COVID test to rule out.  Encouraged plenty of hydration with water and/or electrolyte solution low in sugar. With nausea/vomiting and unable to keep fluids down, go to ER.      Relevant Orders   C. difficile GDH and Toxin A/B   Stool culture   Ova and parasite examination   Fecal Globin By Immunochemistry    Other Visit Diagnoses    Illness    -  Primary   Relevant Orders   SARS-COV-2 RNA,(COVID-19) QUAL NAAT       Follow up plan: Return if symptoms worsen or fail to improve.

## 2020-05-04 LAB — SARS-COV-2 RNA,(COVID-19) QUALITATIVE NAAT: SARS CoV2 RNA: NOT DETECTED

## 2020-05-09 ENCOUNTER — Other Ambulatory Visit: Payer: Self-pay

## 2020-05-09 ENCOUNTER — Other Ambulatory Visit: Payer: Medicare Other

## 2020-05-09 DIAGNOSIS — R197 Diarrhea, unspecified: Secondary | ICD-10-CM | POA: Diagnosis not present

## 2020-05-10 LAB — C. DIFFICILE GDH AND TOXIN A/B
GDH ANTIGEN: NOT DETECTED
MICRO NUMBER:: 11385040
SPECIMEN QUALITY:: ADEQUATE
TOXIN A AND B: NOT DETECTED

## 2020-05-14 LAB — SALMONELLA/SHIGELLA CULT, CAMPY EIA AND SHIGA TOXIN RFL ECOLI
MICRO NUMBER: 11390053
MICRO NUMBER:: 11390054
MICRO NUMBER:: 11390055
Result:: NOT DETECTED
SHIGA RESULT:: NOT DETECTED
SPECIMEN QUALITY: ADEQUATE
SPECIMEN QUALITY:: ADEQUATE
SPECIMEN QUALITY:: ADEQUATE

## 2020-05-14 LAB — CLIENT EDUCATION TRACKING

## 2020-05-16 LAB — OVA AND PARASITE EXAMINATION
CONCENTRATE RESULT:: NONE SEEN
MICRO NUMBER:: 11387274
SPECIMEN QUALITY:: ADEQUATE
TRICHROME RESULT:: NONE SEEN

## 2020-05-21 ENCOUNTER — Other Ambulatory Visit: Payer: Self-pay | Admitting: Family Medicine

## 2020-06-19 ENCOUNTER — Telehealth: Payer: Self-pay | Admitting: *Deleted

## 2020-06-19 DIAGNOSIS — T8521XD Breakdown (mechanical) of intraocular lens, subsequent encounter: Secondary | ICD-10-CM | POA: Diagnosis not present

## 2020-06-19 DIAGNOSIS — E1136 Type 2 diabetes mellitus with diabetic cataract: Secondary | ICD-10-CM | POA: Diagnosis not present

## 2020-06-19 DIAGNOSIS — I10 Essential (primary) hypertension: Secondary | ICD-10-CM | POA: Diagnosis not present

## 2020-06-19 DIAGNOSIS — Z79899 Other long term (current) drug therapy: Secondary | ICD-10-CM | POA: Diagnosis not present

## 2020-06-19 DIAGNOSIS — H20042 Secondary noninfectious iridocyclitis, left eye: Secondary | ICD-10-CM | POA: Diagnosis not present

## 2020-06-19 DIAGNOSIS — H2511 Age-related nuclear cataract, right eye: Secondary | ICD-10-CM | POA: Diagnosis not present

## 2020-06-19 DIAGNOSIS — H35033 Hypertensive retinopathy, bilateral: Secondary | ICD-10-CM | POA: Diagnosis not present

## 2020-06-19 DIAGNOSIS — H35372 Puckering of macula, left eye: Secondary | ICD-10-CM | POA: Diagnosis not present

## 2020-06-19 NOTE — Telephone Encounter (Signed)
Received VM from patient.   Reports that she received letter from PCP in regards to her leaving office. Inquired as to how they can establish within office.   Dr Dennard Schaumann is unable to accept patient at this time.   PCP advised to call one of the providers on the list with the letter to establish.   Call placed to patient. Danielle Howard.

## 2020-06-20 ENCOUNTER — Encounter: Payer: Self-pay | Admitting: Vascular Surgery

## 2020-06-20 ENCOUNTER — Ambulatory Visit (INDEPENDENT_AMBULATORY_CARE_PROVIDER_SITE_OTHER): Payer: Medicare Other | Admitting: Vascular Surgery

## 2020-06-20 ENCOUNTER — Other Ambulatory Visit: Payer: Self-pay

## 2020-06-20 VITALS — BP 144/72 | HR 88 | Temp 97.7°F | Resp 16 | Ht 66.5 in | Wt 214.0 lb

## 2020-06-20 DIAGNOSIS — I890 Lymphedema, not elsewhere classified: Secondary | ICD-10-CM

## 2020-06-20 DIAGNOSIS — M7989 Other specified soft tissue disorders: Secondary | ICD-10-CM

## 2020-06-20 NOTE — Telephone Encounter (Signed)
Call placed to patient and patient made aware.   States that she has an appointment with Dr. Posey Pronto on 07/03/2020.

## 2020-06-20 NOTE — Progress Notes (Signed)
Patient is a 70 year old female who returns for follow-up today.  She was last seen December 2021.  She has chronic leg swelling thought to be due to lymphedema.  Previous arterial and venous noninvasive studies showed no significant pathology.  We were trying to get her approved for pneumatic compression at her last office visit in December 2021.  She does wear thigh-high compression 15 to 20 mm intermittently.  She is currently using her lymphedema pump about 2 hours a day and wears her stockings occasionally at other time periods.  Overall she feels like her legs are improved.  Review of systems: She has no shortness of breath.  She has no chest pain.  Physical exam:  Vitals:   06/20/20 1114  BP: (!) 144/72  Pulse: 88  Resp: 16  Temp: 97.7 F (36.5 C)  TempSrc: Temporal  SpO2: 100%  Weight: 214 lb (97.1 kg)  Height: 5' 6.5" (1.689 m)    Extremities: 2+ dorsalis pedis pulses bilaterally, still has squaring of toes but minimal edema extending from the foot to the knee currently.  Assessment: Improved lymphedema bilateral lower extremities with pump.  Plan: The patient will continue to wear lower extremity compression stockings daily to control swelling symptoms and also intermittently use her lymphedema pump.  She will follow up with me on as-needed basis. Ruta Hinds, MD [facility] Office: [phone]

## 2020-06-27 ENCOUNTER — Ambulatory Visit: Payer: Medicare Other | Admitting: Vascular Surgery

## 2020-06-28 DIAGNOSIS — H4042X3 Glaucoma secondary to eye inflammation, left eye, severe stage: Secondary | ICD-10-CM | POA: Diagnosis not present

## 2020-07-03 ENCOUNTER — Ambulatory Visit: Payer: Medicare Other | Admitting: Internal Medicine

## 2020-07-03 ENCOUNTER — Other Ambulatory Visit: Payer: Self-pay

## 2020-07-04 DIAGNOSIS — H4042X3 Glaucoma secondary to eye inflammation, left eye, severe stage: Secondary | ICD-10-CM | POA: Insufficient documentation

## 2020-07-05 ENCOUNTER — Other Ambulatory Visit: Payer: Self-pay

## 2020-07-05 ENCOUNTER — Encounter (HOSPITAL_COMMUNITY): Payer: Self-pay | Admitting: Emergency Medicine

## 2020-07-05 ENCOUNTER — Emergency Department (HOSPITAL_COMMUNITY): Payer: Medicare Other

## 2020-07-05 ENCOUNTER — Emergency Department (HOSPITAL_COMMUNITY)
Admission: EM | Admit: 2020-07-05 | Discharge: 2020-07-06 | Disposition: A | Discharge status: Home / Self Care | Payer: Medicare Other | Attending: Emergency Medicine | Admitting: Emergency Medicine

## 2020-07-05 DIAGNOSIS — N183 Chronic kidney disease, stage 3 unspecified: Secondary | ICD-10-CM | POA: Diagnosis not present

## 2020-07-05 DIAGNOSIS — I129 Hypertensive chronic kidney disease with stage 1 through stage 4 chronic kidney disease, or unspecified chronic kidney disease: Secondary | ICD-10-CM | POA: Insufficient documentation

## 2020-07-05 DIAGNOSIS — R5383 Other fatigue: Secondary | ICD-10-CM | POA: Insufficient documentation

## 2020-07-05 DIAGNOSIS — E039 Hypothyroidism, unspecified: Secondary | ICD-10-CM | POA: Insufficient documentation

## 2020-07-05 DIAGNOSIS — N3000 Acute cystitis without hematuria: Secondary | ICD-10-CM

## 2020-07-05 DIAGNOSIS — N390 Urinary tract infection, site not specified: Secondary | ICD-10-CM | POA: Insufficient documentation

## 2020-07-05 DIAGNOSIS — I10 Essential (primary) hypertension: Secondary | ICD-10-CM | POA: Diagnosis not present

## 2020-07-05 DIAGNOSIS — Z79899 Other long term (current) drug therapy: Secondary | ICD-10-CM | POA: Insufficient documentation

## 2020-07-05 DIAGNOSIS — J9811 Atelectasis: Secondary | ICD-10-CM | POA: Diagnosis not present

## 2020-07-05 DIAGNOSIS — E860 Dehydration: Secondary | ICD-10-CM | POA: Diagnosis not present

## 2020-07-05 LAB — CBC WITH DIFFERENTIAL/PLATELET
Abs Immature Granulocytes: 0.24 10*3/uL — ABNORMAL HIGH (ref 0.00–0.07)
Basophils Absolute: 0 10*3/uL (ref 0.0–0.1)
Basophils Relative: 0 %
Eosinophils Absolute: 0 10*3/uL (ref 0.0–0.5)
Eosinophils Relative: 0 %
HCT: 35.5 % — ABNORMAL LOW (ref 36.0–46.0)
Hemoglobin: 10.7 g/dL — ABNORMAL LOW (ref 12.0–15.0)
Immature Granulocytes: 2 %
Lymphocytes Relative: 5 %
Lymphs Abs: 0.7 10*3/uL (ref 0.7–4.0)
MCH: 31.2 pg (ref 26.0–34.0)
MCHC: 30.1 g/dL (ref 30.0–36.0)
MCV: 103.5 fL — ABNORMAL HIGH (ref 80.0–100.0)
Monocytes Absolute: 1.6 10*3/uL — ABNORMAL HIGH (ref 0.1–1.0)
Monocytes Relative: 11 %
Neutro Abs: 11.6 10*3/uL — ABNORMAL HIGH (ref 1.7–7.7)
Neutrophils Relative %: 82 %
Platelets: 490 10*3/uL — ABNORMAL HIGH (ref 150–400)
RBC: 3.43 MIL/uL — ABNORMAL LOW (ref 3.87–5.11)
RDW: 17 % — ABNORMAL HIGH (ref 11.5–15.5)
WBC: 14.1 10*3/uL — ABNORMAL HIGH (ref 4.0–10.5)
nRBC: 0 % (ref 0.0–0.2)

## 2020-07-05 LAB — COMPREHENSIVE METABOLIC PANEL
ALT: 10 U/L (ref 0–44)
AST: 33 U/L (ref 15–41)
Albumin: 3.6 g/dL (ref 3.5–5.0)
Alkaline Phosphatase: 104 U/L (ref 38–126)
Anion gap: 12 (ref 5–15)
BUN: 35 mg/dL — ABNORMAL HIGH (ref 8–23)
CO2: 21 mmol/L — ABNORMAL LOW (ref 22–32)
Calcium: 9.4 mg/dL (ref 8.9–10.3)
Chloride: 103 mmol/L (ref 98–111)
Creatinine, Ser: 2.2 mg/dL — ABNORMAL HIGH (ref 0.44–1.00)
GFR, Estimated: 23 mL/min — ABNORMAL LOW (ref >60)
Glucose, Bld: 109 mg/dL — ABNORMAL HIGH (ref 70–99)
Potassium: 4.4 mmol/L (ref 3.5–5.1)
Sodium: 136 mmol/L (ref 135–145)
Total Bilirubin: 0.9 mg/dL (ref 0.3–1.2)
Total Protein: 8.6 g/dL — ABNORMAL HIGH (ref 6.5–8.1)

## 2020-07-05 LAB — URINALYSIS, ROUTINE W REFLEX MICROSCOPIC
Bilirubin Urine: NEGATIVE
Glucose, UA: NEGATIVE mg/dL
Ketones, ur: NEGATIVE mg/dL
Nitrite: NEGATIVE
Protein, ur: 100 mg/dL — AB
Specific Gravity, Urine: 1.013 (ref 1.005–1.030)
WBC, UA: >50 WBC/hpf — ABNORMAL HIGH (ref 0–5)
pH: 6 (ref 5.0–8.0)

## 2020-07-05 LAB — LACTIC ACID, PLASMA
Lactic Acid, Venous: 0.8 mmol/L (ref 0.5–1.9)
Lactic Acid, Venous: 0.6 mmol/L (ref 0.5–1.9)

## 2020-07-05 LAB — CBG MONITORING, ED: Glucose-Capillary: 92 mg/dL (ref 70–99)

## 2020-07-05 MED ORDER — SODIUM CHLORIDE 0.9 % IV BOLUS
1000.0000 mL | Freq: Once | INTRAVENOUS | Status: AC
  Administered 2020-07-05: 1000 mL via INTRAVENOUS

## 2020-07-05 MED ORDER — SODIUM CHLORIDE 0.9 % IV BOLUS (SEPSIS)
250.0000 mL | Freq: Once | INTRAVENOUS | Status: AC
  Administered 2020-07-05: 250 mL via INTRAVENOUS

## 2020-07-05 MED ORDER — CIPROFLOXACIN HCL 500 MG PO TABS: 500.0000 mg | ORAL_TABLET | Freq: Two times a day (BID) | ORAL | 0 refills | Status: DC

## 2020-07-05 MED ORDER — SODIUM CHLORIDE 0.9 % IV SOLN
2.0000 g | Freq: Once | INTRAVENOUS | Status: AC
  Administered 2020-07-05: 2 g via INTRAVENOUS
  Filled 2020-07-05: qty 20

## 2020-07-05 NOTE — ED Provider Notes (Addendum)
[facility] EMERGENCY DEPARTMENT Provider Note   CSN: [id number] Arrival date & time: 07/05/20  1736     History Chief Complaint  Patient presents with  . Danielle Howard    Danielle Howard is a 70 y.o. female.  Patient complains of urinating a lot.  No fever no chills no cough no back pain  The history is provided by the patient and medical records. No language interpreter was used.  Urinary Frequency This is a new problem. The current episode started more than 2 days ago. The problem occurs constantly. The problem has not changed since onset.Pertinent negatives include no chest pain, no abdominal pain and no headaches. Nothing aggravates the symptoms. She has tried nothing for the symptoms. The treatment provided no relief.       Past Medical History:  Diagnosis Date  . Arthritis   . Complication of anesthesia    nausea  . Diabetes mellitus without complication ([place])   . Headache   . Hypertension   . Hypothyroidism   . Hypothyroidism   . Iritis   . Thyroid disease     Patient Active Problem List   Diagnosis Date Noted  . Diarrhea 05/02/2020  . Chronic kidney disease (CKD), stage III (moderate) ([place]) 04/04/2019  . Gout 10/18/2018  . Mixed hyperlipidemia 07/30/2018  . Hx of colonic polyps 05/20/2018  . Acute iritis, left eye 04/07/2018  . Macular pucker, left eye 12/29/2017  . Nuclear sclerotic cataract of right eye 12/29/2017  . Uveitis of left eye 12/29/2017  . PVD (peripheral vascular disease) ([place]) 11/24/2016  . Osteoarthritis, hand 10/01/2016  . Hypothyroidism 01/23/2016  . Post menopausal syndrome 09/05/2013  . Prediabetes 03/07/2013  . Hashimoto's thyroiditis 01/03/2013  . Peripheral edema 01/03/2013  . Obesity 01/03/2013  . Essential hypertension, benign 01/03/2013    Past Surgical History:  Procedure Laterality Date  . ABDOMINAL HYSTERECTOMY    . CATARACT EXTRACTION Left 2019  . CHOLECYSTECTOMY     02/2012  . COLONOSCOPY N/A 05/25/2013   Procedure:  COLONOSCOPY;  Surgeon: Rogene Houston, MD;  Location: [facility];  Service: Endoscopy;  Laterality: N/A;  830-moved to 1055 Ann to notify pt  . COLONOSCOPY N/A 06/21/2018   Procedure: COLONOSCOPY;  Surgeon: Rogene Houston, MD;  Location: [facility];  Service: Endoscopy;  Laterality: N/A;  1020  . JOINT REPLACEMENT Bilateral    2012,2013-bilateral knees  . POLYPECTOMY  06/21/2018   Procedure: POLYPECTOMY;  Surgeon: Rogene Houston, MD;  Location: [facility];  Service: Endoscopy;;  . THYROID SURGERY       OB History   No obstetric history on file.     Family History  Problem Relation Age of Onset  . Arthritis Mother   . Heart disease Mother   . Hypertension Mother   . Arthritis Father   . Hyperlipidemia Father   . HIV/AIDS Brother     Social History   Tobacco Use  . Smoking status: Never Smoker  . Smokeless tobacco: Never Used  Vaping Use  . Vaping Use: Never used  Substance Use Topics  . Alcohol use: Yes    Comment: occasional  . Drug use: No    Home Medications Prior to Admission medications   Medication Sig Start Date End Date Taking? Authorizing Provider  acyclovir (ZOVIRAX) 800 MG tablet Take by mouth 2 (two) times daily. 03/20/20   [provider]  brimonidine (ALPHAGAN) 0.15 % ophthalmic solution Place 1 drop into the left eye 3 (three) times  daily. 02/10/20   [name], MD  Calcium Carbonate-Vit D-Min (CALCIUM 1200 PO) Take by mouth.    [provider]  cycloSPORINE modified (NEORAL) 50 MG capsule Take by mouth. Take (2) caps PO Q AM and (1) cap PO Q PM 09/19/18   [provider]  dorzolamide-timolol (COSOPT) 22.3-6.8 MG/ML ophthalmic solution 2 (two) times daily. Left eye only 03/20/20   [provider]  folic acid (FOLVITE) 1 MG tablet Take 1 mg by mouth daily.  01/25/18   [provider]  latanoprost (XALATAN) 0.005 % ophthalmic solution Place 1 drop into the left eye at bedtime. 02/10/20   [name], MD  levothyroxine (SYNTHROID) 150 MCG tablet Take 1 tablet (150 mcg total) by mouth daily before breakfast. 02/15/20   Nida, Marella Chimes, MD  LUMIGAN 0.01 % SOLN Place 1 drop into the left eye at bedtime. 03/23/20   [provider]  methazolamide (NEPTAZANE) 50 MG tablet Take by mouth 2 (two) times daily. 04/03/20   [provider]  methotrexate (RHEUMATREX) 2.5 MG tablet Take 25 mg by mouth every Wednesday. 10 tabs 03/09/18   [provider]  Multiple Vitamins-Minerals (MULTIVITAMIN WITH MINERALS) tablet Take 1 tablet by mouth daily.    [provider]  potassium chloride SA (KLOR-CON) 20 MEQ tablet TAKE 1 TABLET(20 MEQ) BY MOUTH TWICE DAILY 05/22/20   Susy Frizzle, MD  tiZANidine (ZANAFLEX) 4 MG tablet Take 1 tablet (4 mg total) by mouth every 6 (six) hours as needed for muscle spasms. 11/15/19   [name], MD  torsemide (DEMADEX) 20 MG tablet TAKE 1 TO 2 TABLETS BY MOUTH DAILY AS NEEDED FOR SWELLING Patient taking differently: TAKE 1 TO 2 TABLETS BY MOUTH DAILY  FOR SWELLING 12/09/19   [place], Modena Nunnery, MD  verapamil (CALAN-SR) 240 MG CR tablet TAKE 1 TABLET(240 MG) BY MOUTH TWICE DAILY 03/06/20   [name], MD    Allergies    Acetazolamide and Dicloxacillin  Review of Systems   Review of Systems  Constitutional: Negative for appetite change and Danielle Howard.  HENT: Negative for congestion, ear discharge and sinus pressure.   Eyes: Negative for discharge.  Respiratory: Negative for cough.   Cardiovascular: Negative for chest pain.  Gastrointestinal: Negative for abdominal pain and diarrhea.  Genitourinary: Positive for frequency. Negative for hematuria.  Musculoskeletal: Negative for back pain.  Skin: Negative for rash.  Neurological: Negative for seizures and headaches.  Psychiatric/Behavioral: Negative for hallucinations.    Physical Exam Updated Vital Signs BP (!) 142/76 (BP Location: Right Arm)   Pulse (!) 109    Temp 98.7 F (37.1 C) (Oral)   Resp 20   Ht 5\' 6"  (1.676 m)   Wt 97 kg   SpO2 97%   BMI 34.52 kg/m   Physical Exam Vitals and nursing note reviewed.  Constitutional:      Appearance: She is well-developed.  HENT:     Head: Normocephalic.     Nose: Nose normal.  Eyes:     General: No scleral icterus.    Extraocular Movements: EOM normal.     Conjunctiva/sclera: Conjunctivae normal.  Neck:     Thyroid: No thyromegaly.  Cardiovascular:     Rate and Rhythm: Normal rate and regular rhythm.     Heart sounds: No murmur heard. No friction rub. No gallop.   Pulmonary:     Breath sounds: No stridor. No wheezing or rales.  Chest:     Chest wall:  No tenderness.  Abdominal:     General: There is no distension.     Tenderness: There is no abdominal tenderness. There is no rebound.  Musculoskeletal:        General: No edema. Normal range of motion.     Cervical back: Neck supple.  Lymphadenopathy:     Cervical: No cervical adenopathy.  Skin:    Findings: No erythema or rash.  Neurological:     Mental Status: She is alert and oriented to person, place, and time.     Motor: No abnormal muscle tone.     Coordination: Coordination normal.  Psychiatric:        Mood and Affect: Mood and affect normal.        Behavior: Behavior normal.     ED Results / Procedures / Treatments   Labs (all labs ordered are listed, but only abnormal results are displayed) Labs Reviewed  COMPREHENSIVE METABOLIC PANEL - Abnormal; Notable for the following components:      Result Value   CO2 21 (*)    Glucose, Bld 109 (*)    BUN 35 (*)    Creatinine, Ser 2.20 (*)    Total Protein 8.6 (*)    GFR, Estimated 23 (*)    All other components within normal limits  CBC WITH DIFFERENTIAL/PLATELET - Abnormal; Notable for the following components:   WBC 14.1 (*)    RBC 3.43 (*)    Hemoglobin 10.7 (*)    HCT 35.5 (*)    MCV 103.5 (*)    RDW 17.0 (*)    Platelets 490 (*)    Neutro Abs 11.6 (*)     Monocytes Absolute 1.6 (*)    Abs Immature Granulocytes 0.24 (*)    All other components within normal limits  URINALYSIS, ROUTINE W REFLEX MICROSCOPIC - Abnormal; Notable for the following components:   APPearance CLOUDY (*)    Hgb urine dipstick MODERATE (*)    Protein, ur 100 (*)    Leukocytes,Ua LARGE (*)    WBC, UA >50 (*)    Bacteria, UA FEW (*)    All other components within normal limits  URINE CULTURE  LACTIC ACID, PLASMA  LACTIC ACID, PLASMA  CBG MONITORING, ED    EKG None  Radiology DG Chest 2 View  Result Date: 07/05/2020 CLINICAL DATA:  [place]. EXAM: CHEST - 2 VIEW COMPARISON:  01/07/2018 FINDINGS: The heart size is stable. There is unchanged scarring atelectasis at the lung bases, left greater than right. There is no pneumothorax or large pleural effusion. No acute osseous abnormality. IMPRESSION: No active cardiopulmonary disease. Electronically Signed   By: Constance Holster M.D.   On: 07/05/2020 19:22    Procedures Procedures   Medications Ordered in ED Medications  sodium chloride 0.9 % bolus 250 mL (0 mLs Intravenous Stopped 07/05/20 2224)  sodium chloride 0.9 % bolus 1,000 mL (1,000 mLs Intravenous New Bag/Given 07/05/20 2237)    ED Course  I have reviewed the triage vital signs and the nursing notes.  Pertinent labs & imaging results that were available during my care of the patient were reviewed by me and considered in my medical decision making (see chart for details). Patient with some dehydration elevated creatinine and urinary tract infection.  She is given a liter of fluids and Rocephin along with Keflex and will follow up with her PCP   MDM Rules/Calculators/A&P  Patient has a urinary tract infection and is dehydrated.  She is given fluids and Rocephin and Cipro as an outpatient.  She also has EKG changes that are nonacute.  She has never seen cardiology so she is being referred to Dr. Harl Bowie for follow-up  Final Clinical  Impression(s) / ED Diagnoses Final diagnoses:  None    Rx / DC Orders ED Discharge Orders    None       Milton Ferguson, MD 07/12/20 1020    Milton Ferguson, MD 07/15/20 1216

## 2020-07-05 NOTE — ED Notes (Signed)

## 2020-07-05 NOTE — Discharge Instructions (Addendum)
Drink plenty of fluids.  Follow-up with your doctor next week and return sooner if any problem.  You have also been referred to cardiology Dr. Harl Bowie for some changes on your heart tracing

## 2020-07-05 NOTE — ED Notes (Signed)
Pt ambulated to restroom with assistance at this time urine  obtained and sent for testing Danielle Howard

## 2020-07-05 NOTE — ED Triage Notes (Signed)
Pt c/o fatigue and not drinking enough fluids for 2 weeks. Pt states she runs a fever off and on. Denies n/v/d

## 2020-07-08 LAB — URINE CULTURE: Culture: 70000 — AB

## 2020-07-16 ENCOUNTER — Encounter: Payer: Self-pay | Admitting: Internal Medicine

## 2020-07-16 ENCOUNTER — Ambulatory Visit (INDEPENDENT_AMBULATORY_CARE_PROVIDER_SITE_OTHER): Payer: Medicare Other | Admitting: Internal Medicine

## 2020-07-16 ENCOUNTER — Other Ambulatory Visit: Payer: Self-pay

## 2020-07-16 VITALS — BP 153/71 | HR 99 | Temp 98.0°F | Resp 18 | Ht 66.0 in | Wt 207.1 lb

## 2020-07-16 DIAGNOSIS — H4042X3 Glaucoma secondary to eye inflammation, left eye, severe stage: Secondary | ICD-10-CM | POA: Diagnosis not present

## 2020-07-16 DIAGNOSIS — Z7689 Persons encountering health services in other specified circumstances: Secondary | ICD-10-CM

## 2020-07-16 DIAGNOSIS — I739 Peripheral vascular disease, unspecified: Secondary | ICD-10-CM

## 2020-07-16 DIAGNOSIS — E669 Obesity, unspecified: Secondary | ICD-10-CM | POA: Diagnosis not present

## 2020-07-16 DIAGNOSIS — I10 Essential (primary) hypertension: Secondary | ICD-10-CM

## 2020-07-16 DIAGNOSIS — E890 Postprocedural hypothyroidism: Secondary | ICD-10-CM | POA: Diagnosis not present

## 2020-07-16 DIAGNOSIS — N1832 Chronic kidney disease, stage 3b: Secondary | ICD-10-CM

## 2020-07-16 NOTE — Progress Notes (Signed)
New Patient Office Visit  Subjective:  Patient ID: Danielle Howard, female    DOB: 09-28-1950  Age: 70 y.o. MRN: [id number]  CC:  Chief Complaint  Patient presents with  . New Patient (Initial Visit)    New patient former dr Buelah Manis pt had a uti went to ER they gave her cipro this is better     HPI Danielle Howard is a 70 year old female with PMH of HTN, PVD, hypothyroidism, gluacoma, uveitis and obesity who presents for establishing care. She is a former patient of Dr Buelah Manis.  She recently went to ER for UTI and was prescribed Ciprofloxacin. She now feels better, denies dysuria or hematuria.  Her BP was elevated today, but she has not taken her medications today. She is compliant with her medications. She denies any dizziness, chest pain, dyspnea or palpitations. She was recently referred to Cardiology from ER for baseline EKG changes.  She has h/o hypothyroidism and takes Levothyroxine 150 mcg QD. She follows up with Dr Dorris Fetch.  She has had multiple complications since she had cataract surgery. She is using multiple eye drops for uveitis and glaucoma. She is on Methotrexate for uveitis.  She has baseline CKD stage 3. She is not seeing any Nephrologist. Denies any urinary incontinence, dysuria or hematuria.  She is up-to-date with COVID and flu vaccine.  Past Medical History:  Diagnosis Date  . Arthritis   . Complication of anesthesia    nausea  . Diabetes mellitus without complication ([place])   . Headache   . Hypertension   . Hypothyroidism   . Hypothyroidism   . Iritis   . Thyroid disease     Past Surgical History:  Procedure Laterality Date  . ABDOMINAL HYSTERECTOMY    . CATARACT EXTRACTION Left 2019  . CHOLECYSTECTOMY     02/2012  . COLONOSCOPY N/A 05/25/2013   Procedure: COLONOSCOPY;  Surgeon: Rogene Houston, MD;  [facility];  Service: Endoscopy;  Laterality: N/A;  830-moved to 1055 Ann to notify pt  . COLONOSCOPY N/A 06/21/2018   Procedure: COLONOSCOPY;   Surgeon: Rogene Houston, MD;  [facility];  Service: Endoscopy;  Laterality: N/A;  1020  . EYE SURGERY N/A    [employer] 07/13/2020  . JOINT REPLACEMENT Bilateral    2012,2013-bilateral knees  . POLYPECTOMY  06/21/2018   Procedure: POLYPECTOMY;  Surgeon: Rogene Houston, MD;  [facility];  Service: Endoscopy;;  . THYROID SURGERY      Family History  Problem Relation Age of Onset  . Arthritis Mother   . Heart disease Mother   . Hypertension Mother   . Arthritis Father   . Hyperlipidemia Father   . HIV/AIDS Brother     Social History   Socioeconomic History  . Marital status: Single    Spouse name: Not on file  . Number of children: Not on file  . Years of education: Not on file  . Highest education level: Not on file  Occupational History  . Not on file  Tobacco Use  . Smoking status: Never Smoker  . Smokeless tobacco: Never Used  Vaping Use  . Vaping Use: Never used  Substance and Sexual Activity  . Alcohol use: Yes    Comment: occasional  . Drug use: No  . Sexual activity: Yes  Other Topics Concern  . Not on file  Social History Narrative  . Not on file   Social Determinants of Health   Financial Resource Strain: Not on  file  Food Insecurity: Not on file  Transportation Needs: Not on file  Physical Activity: Not on file  Stress: Not on file  Social Connections: Not on file  Intimate Partner Violence: Not on file    ROS Review of Systems  Constitutional: Negative for chills and fever.  HENT: Negative for congestion, sinus pressure, sinus pain and sore throat.   Eyes: Positive for pain and redness (left). Negative for discharge.  Respiratory: Negative for cough and shortness of breath.   Cardiovascular: Negative for chest pain and palpitations.  Gastrointestinal: Negative for abdominal pain, constipation, diarrhea, nausea and vomiting.  Endocrine: Negative for polydipsia and polyuria.  Genitourinary: Negative for dysuria and  hematuria.  Musculoskeletal: Negative for neck pain and neck stiffness.  Skin: Negative for rash.  Neurological: Negative for dizziness and weakness.  Psychiatric/Behavioral: Negative for agitation and behavioral problems.    Objective:   Today's Vitals: BP (!) 153/71 (BP Location: Right Arm, Patient Position: Sitting, Cuff Size: Normal)   Pulse 99   Temp 98 F (36.7 C) (Oral)   Resp 18   Ht 5\' 6"  (1.676 m)   Wt 207 lb 1.9 oz (93.9 kg)   SpO2 100%   BMI 33.43 kg/m   Physical Exam Vitals reviewed.  Constitutional:      General: She is not in acute distress.    Appearance: She is not diaphoretic.  HENT:     Head: Normocephalic and atraumatic.     Nose: Nose normal.     Mouth/Throat:     Mouth: Mucous membranes are moist.  Eyes:     General: No scleral icterus.    Extraocular Movements: Extraocular movements intact.     Conjunctiva/sclera:     Left eye: Left conjunctiva is injected.  Cardiovascular:     Rate and Rhythm: Normal rate and regular rhythm.     Pulses: Normal pulses.     Heart sounds: Normal heart sounds. No murmur heard.   Pulmonary:     Breath sounds: Normal breath sounds. No wheezing or rales.  Musculoskeletal:     Cervical back: Neck supple. No tenderness.     Right lower leg: No edema.     Left lower leg: No edema.  Skin:    General: Skin is warm.     Findings: No rash.  Neurological:     General: No focal deficit present.     Mental Status: She is alert and oriented to person, place, and time.  Psychiatric:        Mood and Affect: Mood normal.        Behavior: Behavior normal.     Assessment & Plan:   Problem List Items Addressed This Visit      Encounter to establish care    -  Primary  Care established Previous chart reviewed History and medications reviewed with the patient   Cardiovascular and Mediastinum   Essential hypertension, benign    BP Readings from Last 1 Encounters:  07/16/20 (!) 153/71   Elevated as patient has not  taken her medications today Counseled for compliance with the medications Advised DASH diet and moderate exercise/walking, at least 150 mins/week       PVD (peripheral vascular disease) ([place])    Likely contributing to leg swelling Takes Demadex PRN        Endocrine   Hypothyroidism    On Levothyroxine 150 mcg QD Follows up with Endocrinology - Dr Dorris Fetch        Genitourinary  Chronic kidney disease (CKD), stage III (moderate) ([facility])    Last BMP showed AKI on CKD, likely due to underlying UTI Advised to maintain proper hydration Avoid nephrotoxic agents Based on next BMP, will decide for Nephrology referral        Other   Obesity (BMI 30-39.9)    Diet modification and activity as tolerated Patient has been losing weight, states she recently lost weight due to infection      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 acyclovir (ZOVIRAX) 800 MG tablet Take by mouth 2 (two) times daily.  . brimonidine (ALPHAGAN) 0.15 % ophthalmic solution Place 1 drop into the left eye 3 (three) times daily.  . Calcium Carbonate-Vit D-Min (CALCIUM 1200 PO) Take by mouth.  . cycloSPORINE modified (NEORAL) 50 MG capsule Take by mouth. Take (2) caps PO Q AM and (1) cap PO Q PM  . dorzolamide-timolol (COSOPT) 22.3-6.8 MG/ML ophthalmic solution 2 (two) times daily. Left eye only  . folic acid (FOLVITE) 1 MG tablet Take 1 mg by mouth daily.   Marland Kitchen latanoprost (XALATAN) 0.005 % ophthalmic solution Place 1 drop into the left eye at bedtime.  Marland Kitchen levothyroxine (SYNTHROID) 150 MCG tablet Take 1 tablet (150 mcg total) by mouth daily before breakfast.  . LUMIGAN 0.01 % SOLN Place 1 drop into the left eye at bedtime.  . methazolamide  (NEPTAZANE) 50 MG tablet Take by mouth 2 (two) times daily.  . methotrexate (RHEUMATREX) 2.5 MG tablet Take 25 mg by mouth every Wednesday. 10 tabs  . Multiple Vitamins-Minerals (MULTIVITAMIN WITH MINERALS) tablet Take 1 tablet by mouth daily.  . potassium chloride SA (KLOR-CON) 20 MEQ tablet TAKE 1 TABLET(20 MEQ) BY MOUTH TWICE DAILY  . torsemide (DEMADEX) 20 MG tablet TAKE 1 TO 2 TABLETS BY MOUTH DAILY AS NEEDED FOR SWELLING (Patient taking differently: TAKE 1 TO 2 TABLETS BY MOUTH DAILY  FOR SWELLING)  . verapamil (CALAN-SR) 240 MG CR tablet TAKE 1 TABLET(240 MG) BY MOUTH TWICE DAILY  . [DISCONTINUED] ciprofloxacin (CIPRO) 500 MG tablet Take 1 tablet (500 mg total) by mouth 2 (two) times daily. One po bid x 7 days  . [DISCONTINUED] tiZANidine (ZANAFLEX) 4 MG tablet Take 1 tablet (4 mg total) by mouth every 6 (six) hours as needed for muscle spasms.   No facility-administered encounter medications on file as of 07/16/2020.    Follow-up: Return in about 4 months (around 11/15/2020).   Lindell Spar, MD

## 2020-07-16 NOTE — Assessment & Plan Note (Addendum)
On Levothyroxine 150 mcg QD Follows up with Endocrinology - Dr Nida 

## 2020-07-16 NOTE — Assessment & Plan Note (Signed)
Planned to have Ophthalmologic procedure On Methazolamide Cosopt and Xalatan eye drops Has multiple eye drops for h/o uveitis as well Continue to follow up with Ophthalmology

## 2020-07-16 NOTE — Patient Instructions (Signed)
Please continue to take medications as prescribed.  Please follow low salt diet and continue to ambulate as tolerated.  Continue to use compression stocking for leg swelling.

## 2020-07-16 NOTE — Assessment & Plan Note (Signed)
Diet modification and activity as tolerated Patient has been losing weight, states she recently lost weight due to infection

## 2020-07-16 NOTE — Assessment & Plan Note (Signed)
Likely contributing to leg swelling Takes Demadex PRN 

## 2020-07-16 NOTE — Assessment & Plan Note (Signed)
BP Readings from Last 1 Encounters:  07/16/20 (!) 153/71   Elevated as patient has not taken her medications today Counseled for compliance with the medications Advised DASH diet and moderate exercise/walking, at least 150 mins/week

## 2020-07-16 NOTE — Assessment & Plan Note (Signed)
Last BMP showed AKI on CKD, likely due to underlying UTI Advised to maintain proper hydration Avoid nephrotoxic agents Based on next BMP, will decide for Nephrology referral

## 2020-07-31 DIAGNOSIS — H35033 Hypertensive retinopathy, bilateral: Secondary | ICD-10-CM | POA: Diagnosis not present

## 2020-07-31 DIAGNOSIS — I10 Essential (primary) hypertension: Secondary | ICD-10-CM | POA: Diagnosis not present

## 2020-07-31 DIAGNOSIS — T8521XD Breakdown (mechanical) of intraocular lens, subsequent encounter: Secondary | ICD-10-CM | POA: Diagnosis not present

## 2020-07-31 DIAGNOSIS — Z79899 Other long term (current) drug therapy: Secondary | ICD-10-CM | POA: Diagnosis not present

## 2020-07-31 DIAGNOSIS — H2511 Age-related nuclear cataract, right eye: Secondary | ICD-10-CM | POA: Diagnosis not present

## 2020-07-31 DIAGNOSIS — H20042 Secondary noninfectious iridocyclitis, left eye: Secondary | ICD-10-CM | POA: Diagnosis not present

## 2020-07-31 DIAGNOSIS — E1136 Type 2 diabetes mellitus with diabetic cataract: Secondary | ICD-10-CM | POA: Diagnosis not present

## 2020-07-31 DIAGNOSIS — H35372 Puckering of macula, left eye: Secondary | ICD-10-CM | POA: Diagnosis not present

## 2020-07-31 DIAGNOSIS — Z961 Presence of intraocular lens: Secondary | ICD-10-CM | POA: Diagnosis not present

## 2020-08-03 ENCOUNTER — Other Ambulatory Visit: Payer: Self-pay | Admitting: "Endocrinology

## 2020-08-15 ENCOUNTER — Encounter: Payer: Self-pay | Admitting: Cardiology

## 2020-08-15 ENCOUNTER — Ambulatory Visit (INDEPENDENT_AMBULATORY_CARE_PROVIDER_SITE_OTHER): Payer: Medicare Other | Admitting: Cardiology

## 2020-08-15 ENCOUNTER — Other Ambulatory Visit: Payer: Self-pay

## 2020-08-15 VITALS — BP 140/70 | HR 76 | Ht 66.0 in | Wt 207.0 lb

## 2020-08-15 DIAGNOSIS — I10 Essential (primary) hypertension: Secondary | ICD-10-CM

## 2020-08-15 DIAGNOSIS — R9431 Abnormal electrocardiogram [ECG] [EKG]: Secondary | ICD-10-CM

## 2020-08-15 NOTE — Progress Notes (Signed)
Clinical Summary Ms. Abeyta is a 70 y.o.female seen today as a new patient after recet ER visit.    1. Abnormal EKG - denies any chest, no SOB - highest activity is housework, tolerates well   2. HTN - compliant with meds  3. Lymphedema - followed by vascular - also on torsemide.   Past Medical History:  Diagnosis Date  . Arthritis   . Complication of anesthesia    nausea  . Diabetes mellitus without complication ([place])   . Headache   . Hypertension   . Hypothyroidism   . Hypothyroidism   . Iritis   . Thyroid disease      Allergies  Allergen Reactions  . Acetazolamide   . Dicloxacillin Other (See Comments)    GI upset     Current Outpatient Medications  Medication Sig Dispense Refill  . acyclovir (ZOVIRAX) 800 MG tablet Take by mouth 2 (two) times daily.    . brimonidine (ALPHAGAN) 0.15 % ophthalmic solution Place 1 drop into the left eye 3 (three) times daily. 5 mL 12  . Calcium Carbonate-Vit D-Min (CALCIUM 1200 PO) Take by mouth.    . cycloSPORINE modified (NEORAL) 50 MG capsule Take by mouth. Take (2) caps PO Q AM and (1) cap PO Q PM    . dorzolamide-timolol (COSOPT) 22.3-6.8 MG/ML ophthalmic solution 2 (two) times daily. Left eye only    . folic acid (FOLVITE) 1 MG tablet Take 1 mg by mouth daily.     Marland Kitchen latanoprost (XALATAN) 0.005 % ophthalmic solution Place 1 drop into the left eye at bedtime. 2.5 mL 12  . levothyroxine (SYNTHROID) 150 MCG tablet TAKE 1 TABLET(150 MCG) BY MOUTH DAILY BEFORE BREAKFAST 90 tablet 1  . LUMIGAN 0.01 % SOLN Place 1 drop into the left eye at bedtime.    . methazolamide (NEPTAZANE) 50 MG tablet Take by mouth 2 (two) times daily.    . methotrexate (RHEUMATREX) 2.5 MG tablet Take 25 mg by mouth every Wednesday. 10 tabs    . Multiple Vitamins-Minerals (MULTIVITAMIN WITH MINERALS) tablet Take 1 tablet by mouth daily.    . potassium chloride SA (KLOR-CON) 20 MEQ tablet TAKE 1 TABLET(20 MEQ) BY MOUTH TWICE DAILY 180 tablet 3  .  torsemide (DEMADEX) 20 MG tablet TAKE 1 TO 2 TABLETS BY MOUTH DAILY AS NEEDED FOR SWELLING (Patient taking differently: TAKE 1 TO 2 TABLETS BY MOUTH DAILY  FOR SWELLING) 180 tablet 2  . verapamil (CALAN-SR) 240 MG CR tablet TAKE 1 TABLET(240 MG) BY MOUTH TWICE DAILY 180 tablet 1   No current facility-administered medications for this visit.     Past Surgical History:  Procedure Laterality Date  . ABDOMINAL HYSTERECTOMY    . CATARACT EXTRACTION Left 2019  . CHOLECYSTECTOMY     02/2012  . COLONOSCOPY N/A 05/25/2013   Procedure: COLONOSCOPY;  Surgeon: Rogene Houston, MD;  [facility];  Service: Endoscopy;  Laterality: N/A;  830-moved to 1055 Ann to notify pt  . COLONOSCOPY N/A 06/21/2018   Procedure: COLONOSCOPY;  Surgeon: Rogene Houston, MD;  [facility]: [facility];  Service: Endoscopy;  Laterality: N/A;  1020  . EYE SURGERY N/A    Phreesia 07/13/2020  . JOINT REPLACEMENT Bilateral    2012,2013-bilateral knees  . POLYPECTOMY  06/21/2018   Procedure: POLYPECTOMY;  Surgeon: Rogene Houston, MD;  [facility]: [facility];  Service: Endoscopy;;  . THYROID SURGERY       Allergies  Allergen Reactions  .  Acetazolamide   . Dicloxacillin Other (See Comments)    GI upset      Family History  Problem Relation Age of Onset  . Arthritis Mother   . Heart disease Mother   . Hypertension Mother   . Arthritis Father   . Hyperlipidemia Father   . HIV/AIDS Brother      Social History Ms. Stirling reports that she has never smoked. She has never used smokeless tobacco. Ms. Salemi reports current alcohol use.   Review of Systems CONSTITUTIONAL: No weight loss, fever, chills, weakness or fatigue.  HEENT: Eyes: No visual loss, blurred vision, double vision or yellow sclerae.No hearing loss, sneezing, congestion, runny nose or sore throat.  SKIN: No rash or itching.  CARDIOVASCULAR: per hpi RESPIRATORY: No shortness of breath, cough or sputum.  GASTROINTESTINAL: No  anorexia, nausea, vomiting or diarrhea. No abdominal pain or blood.  GENITOURINARY: No burning on urination, no polyuria NEUROLOGICAL: No headache, dizziness, syncope, paralysis, ataxia, numbness or tingling in the extremities. No change in bowel or bladder control.  MUSCULOSKELETAL: No muscle, back pain, joint pain or stiffness.  LYMPHATICS: No enlarged nodes. No history of splenectomy.  PSYCHIATRIC: No history of depression or anxiety.  ENDOCRINOLOGIC: No reports of sweating, cold or heat intolerance. No polyuria or polydipsia.  Marland Kitchen   Physical Examination Today's Vitals   08/15/20 0900  BP: 140/70  Pulse: 76  SpO2: 98%  Weight: 207 lb (93.9 kg)  Height: 5\' 6"  (1.676 m)   Body mass index is 33.41 kg/m.  Gen: resting comfortably, no acute distress HEENT: no scleral icterus, pupils equal round and reactive, no palptable cervical adenopathy,  CV: RRR, no m/r/g, no jvd Resp: Clear to auscultation bilaterally GI: abdomen is soft, non-tender, non-distended, normal bowel sounds, no hepatosplenomegaly MSK: extremities are warm, no edema.  Skin: warm, no rash Neuro:  no focal deficits Psych: appropriate affect     Assessment and Plan  1. Abnormal EKG  - ER EKG reviewed, appears to be due to lead misplacement causing the abnormal P-QRS-T wave axis in the inferior leads. EKG today shows NSR with normal axis, only some LAE - no further testing is indicated  2. HTN - manual recheck reasonable at 134/70, continue current meds    F/u just as needed.       Arnoldo Lenis, M.D.

## 2020-08-15 NOTE — Patient Instructions (Signed)
Medication Instructions:  Continue all current medications.  Labwork: none  Testing/Procedures: none  Follow-Up: As needed.    Any Other Special Instructions Will Be Listed Below (If Applicable).  If you need a refill on your cardiac medications before your next appointment, please call your pharmacy.  

## 2020-08-28 DIAGNOSIS — H4042X4 Glaucoma secondary to eye inflammation, left eye, indeterminate stage: Secondary | ICD-10-CM | POA: Diagnosis not present

## 2020-08-28 DIAGNOSIS — H4042X3 Glaucoma secondary to eye inflammation, left eye, severe stage: Secondary | ICD-10-CM | POA: Diagnosis not present

## 2020-08-31 ENCOUNTER — Other Ambulatory Visit: Payer: Self-pay | Admitting: *Deleted

## 2020-08-31 ENCOUNTER — Other Ambulatory Visit: Payer: Self-pay | Admitting: Family Medicine

## 2020-08-31 ENCOUNTER — Telehealth: Payer: Self-pay

## 2020-08-31 MED ORDER — VERAPAMIL HCL ER 240 MG PO TBCR: EXTENDED_RELEASE_TABLET | ORAL | 1 refills | Status: DC

## 2020-08-31 MED ORDER — TORSEMIDE 20 MG PO TABS: ORAL_TABLET | ORAL | 2 refills | Status: DC

## 2020-08-31 NOTE — Telephone Encounter (Signed)
Pt medication sent to pharmacy  

## 2020-08-31 NOTE — Telephone Encounter (Signed)
Patient called need med refill  torsemide (DEMADEX) 20 MG tablet  verapamil (CALAN-SR) 240 MG CR tablet   Send to [facility]

## 2020-09-19 ENCOUNTER — Other Ambulatory Visit (HOSPITAL_COMMUNITY): Payer: Self-pay | Admitting: Internal Medicine

## 2020-09-19 DIAGNOSIS — Z1231 Encounter for screening mammogram for malignant neoplasm of breast: Secondary | ICD-10-CM

## 2020-09-26 DIAGNOSIS — E890 Postprocedural hypothyroidism: Secondary | ICD-10-CM | POA: Diagnosis not present

## 2020-09-27 LAB — TSH: TSH: 0.158 u[IU]/mL — ABNORMAL LOW (ref 0.450–4.500)

## 2020-09-27 LAB — T4, FREE: Free T4: 1.77 ng/dL (ref 0.82–1.77)

## 2020-10-02 NOTE — Progress Notes (Signed)
Subjective:   Danielle Howard is a 70 y.o. female who presents for Medicare Annual (Subsequent) preventive examination.  I connected with Danielle Howard today by telephone and verified that I am speaking with the correct person using two identifiers. Location patient: home Location provider: work Persons participating in the virtual visit: patient, provider.   I discussed the limitations, risks, security and privacy concerns of performing an evaluation and management service by telephone and the availability of in person appointments. I also discussed with the patient that there may be a patient responsible charge related to this service. The patient expressed understanding and verbally consented to this telephonic visit.    Interactive audio and video telecommunications were attempted between this provider and patient, however failed, due to patient having technical difficulties OR patient did not have access to video capability.  We continued and completed visit with audio only.      Review of Systems    N/A  Cardiac Risk Factors include: advanced age (>27men, >9 women);hypertension;dyslipidemia     Objective:    Today's Vitals   There is no height or weight on file to calculate BMI.  Advanced Directives 10/03/2020 07/05/2020 08/08/2019 06/21/2018 04/07/2018 01/29/2017 05/25/2013  Does Patient Have a Medical Advance Directive? No No No No No No Patient does not have advance directive;Patient would not like information  Would patient like information on creating a medical advance directive? No - Patient declined No - Patient declined No - Patient declined No - Patient declined No - Patient declined - -  Pre-existing out of facility DNR order (yellow form or pink MOST form) - - - - - - No    Current Medications (verified) Outpatient Encounter Medications as of 10/03/2020  Medication Sig  . acyclovir (ZOVIRAX) 800 MG tablet Take by mouth 2 (two) times daily.  . brimonidine (ALPHAGAN) 0.15 %  ophthalmic solution Place 1 drop into the left eye 3 (three) times daily.  . Calcium Carbonate-Vit D-Min (CALCIUM 1200 PO) Take by mouth.  . cycloSPORINE modified (NEORAL) 50 MG capsule Take by mouth. Take (2) caps PO Q AM and (1) cap PO Q PM  . dorzolamide-timolol (COSOPT) 22.3-6.8 MG/ML ophthalmic solution 2 (two) times daily. Left eye only  . folic acid (FOLVITE) 1 MG tablet Take 1 mg by mouth daily.   Marland Kitchen latanoprost (XALATAN) 0.005 % ophthalmic solution Place 1 drop into the left eye at bedtime.  Marland Kitchen levothyroxine (SYNTHROID) 150 MCG tablet TAKE 1 TABLET(150 MCG) BY MOUTH DAILY BEFORE BREAKFAST  . LUMIGAN 0.01 % SOLN Place 1 drop into the left eye at bedtime.  . methazolamide (NEPTAZANE) 50 MG tablet Take by mouth 2 (two) times daily.  . methotrexate (RHEUMATREX) 2.5 MG tablet Take 25 mg by mouth every Wednesday. 10 tabs  . Multiple Vitamins-Minerals (MULTIVITAMIN WITH MINERALS) tablet Take 1 tablet by mouth daily.  . potassium chloride SA (KLOR-CON) 20 MEQ tablet TAKE 1 TABLET(20 MEQ) BY MOUTH TWICE DAILY (Patient taking differently: Take 20 mEq by mouth daily. TAKE 1 TABLET(20 MEQ) BY MOUTH TWICE DAILY)  . torsemide (DEMADEX) 20 MG tablet TAKE 1 TO 2 TABLETS BY MOUTH DAILY AS NEEDED FOR SWELLING  . verapamil (CALAN-SR) 240 MG CR tablet Take 1 tablet by mouth twice daily   No facility-administered encounter medications on file as of 10/03/2020.    Allergies (verified) Acetazolamide and Dicloxacillin   History: Past Medical History:  Diagnosis Date  . Arthritis   . Complication of anesthesia    nausea  .  Diabetes mellitus without complication ([place])   . Headache   . Hypertension   . Hypothyroidism   . Hypothyroidism   . Iritis   . Thyroid disease    Past Surgical History:  Procedure Laterality Date  . ABDOMINAL HYSTERECTOMY    . CATARACT EXTRACTION Left 2019  . CHOLECYSTECTOMY     02/2012  . COLONOSCOPY N/A 05/25/2013   Procedure: COLONOSCOPY;  Surgeon: Rogene Houston, MD;   [facility];  Service: Endoscopy;  Laterality: N/A;  830-moved to 1055 Ann to notify pt  . COLONOSCOPY N/A 06/21/2018   Procedure: COLONOSCOPY;  Surgeon: Rogene Houston, MD;  [facility];  Service: Endoscopy;  Laterality: N/A;  1020  . EYE SURGERY N/A    Phreesia 07/13/2020  . JOINT REPLACEMENT Bilateral    2012,2013-bilateral knees  . POLYPECTOMY  06/21/2018   Procedure: POLYPECTOMY;  Surgeon: Rogene Houston, MD;  [facility];  Service: Endoscopy;;  . THYROID SURGERY     Family History  Problem Relation Age of Onset  . Arthritis Mother   . Heart disease Mother   . Hypertension Mother   . Arthritis Father   . Hyperlipidemia Father   . HIV/AIDS Brother    Social History   Socioeconomic History  . Marital status: Single    Spouse name: Not on file  . Number of children: Not on file  . Years of education: Not on file  . Highest education level: Not on file  Occupational History  . Not on file  Tobacco Use  . Smoking status: Never Smoker  . Smokeless tobacco: Never Used  Vaping Use  . Vaping Use: Never used  Substance and Sexual Activity  . Alcohol use: Yes    Comment: occasional  . Drug use: No  . Sexual activity: Yes  Other Topics Concern  . Not on file  Social History Narrative  . Not on file   Social Determinants of Health   Financial Resource Strain: Low Risk   . Difficulty of Paying Living Expenses: Not hard at all  Food Insecurity: No Food Insecurity  . Worried About Charity fundraiser in the Last Year: Never true  . Ran Out of Food in the Last Year: Never true  Transportation Needs: No Transportation Needs  . Lack of Transportation (Medical): No  . Lack of Transportation (Non-Medical): No  Physical Activity: Inactive  . Days of Exercise per Week: 0 days  . Minutes of Exercise per Session: 0 min  Stress: No Stress Concern Present  . Feeling of Stress : Not at all  Social Connections: Moderately Isolated  .  Frequency of Communication with Friends and Family: More than three times a week  . Frequency of Social Gatherings with Friends and Family: More than three times a week  . Attends Religious Services: More than 4 times per year  . Active Member of Clubs or Organizations: No  . Attends Archivist Meetings: Never  . Marital Status: Never married    Tobacco Counseling Counseling given: Not Answered   Clinical Intake:  Pre-visit preparation completed: Yes  Pain : No/denies pain     Nutritional Risks: None Diabetes: No  How often do you need to have someone help you when you read instructions, pamphlets, or other written materials from your doctor or pharmacy?: 1 - Never  Diabetic?No   Interpreter Needed?: No  Information entered by :: [place] of Daily Living In your present  state of health, do you have any difficulty performing the following activities: 10/03/2020  Hearing? N  Vision? Y  Comment has glaucoma and cataract  Difficulty concentrating or making decisions? N  Walking or climbing stairs? N  Dressing or bathing? N  Doing errands, shopping? N  Preparing Food and eating ? N  Using the Toilet? N  In the past six months, have you accidently leaked urine? N  Do you have problems with loss of bowel control? N  Managing your Medications? N  Managing your Finances? N  Housekeeping or managing your Housekeeping? N  Some recent data might be hidden    Patient Care Team: Lindell Spar, MD as PCP - General (Internal Medicine) Harl Bowie Alphonse Guild, MD as PCP - Cardiology (Cardiology)  Indicate any recent Medical Services you may have received from other than [facility] providers in the past year (date may be approximate).     Assessment:   This is a routine wellness examination for Sandrea.  Hearing/Vision screen  Hearing Screening   125Hz  250Hz  500Hz  1000Hz  2000Hz  3000Hz  4000Hz  6000Hz  8000Hz   Right ear:           Left ear:           Vision  Screening Comments: Patient states sees eye specialist several times a year. Has hx of glaucoma and cataracts. Currently wears glasses   Dietary issues and exercise activities discussed: Current Exercise Habits: The patient does not participate in regular exercise at present, Exercise limited by: orthopedic condition(s)  Goals Addressed            This Visit's Progress   . DIET - EAT MORE FRUITS AND VEGETABLES      . DIET - REDUCE FAT INTAKE      . Weight (lb) < 200 lb (90.7 kg)        Depression Screen PHQ 2/9 Scores 10/03/2020 07/16/2020 05/02/2020 08/08/2019 04/04/2019 08/09/2018 04/07/2018  PHQ - 2 Score 0 0 0 0 0 0 0  PHQ- 9 Score - - - - - - -    Fall Risk Fall Risk  10/03/2020 07/16/2020 05/02/2020 08/08/2019 04/04/2019  Falls in the past year? 1 0 1 1 1   Number falls in past yr: 0 0 0 - 0  Injury with Fall? 0 0 1 - 0  Risk for fall due to : Orthopedic patient No Fall Risks - No Fall Risks Impaired mobility;Impaired balance/gait;Orthopedic patient;History of fall(s)  Follow up Falls evaluation completed;Falls prevention discussed Falls evaluation completed - Falls evaluation completed Falls evaluation completed    FALL RISK PREVENTION PERTAINING TO THE HOME:  Any stairs in or around the home? Yes  If so, are there any without handrails? No  Home free of loose throw rugs in walkways, pet beds, electrical cords, etc? Yes  Adequate lighting in your home to reduce risk of falls? Yes   ASSISTIVE DEVICES UTILIZED TO PREVENT FALLS:  Life alert? No  Use of a cane, walker or w/c? Yes  Grab bars in the bathroom? Yes  Shower chair or bench in shower? Yes  Elevated toilet seat or a handicapped toilet? Yes    Cognitive Function:   Normal cognitive status assessed by direct observation by this Nurse Health Advisor. No abnormalities found.       Immunizations Immunization History  Administered Date(s) Administered  . Fluad Quad(high Dose 65+) 01/13/2019  . Influenza Whole 02/09/2013   . Influenza, High Dose Seasonal PF 01/20/2017, 01/27/2018, 01/04/2020  . Influenza,inj,Quad PF,6+ Mos  01/15/2015, 01/25/2016  . Influenza-Unspecified 01/13/2014  . Moderna Sars-Covid-2 Vaccination 06/16/2019, 07/18/2019  . Pneumococcal Conjugate-13 01/15/2015  . Pneumococcal Polysaccharide-23 01/13/2014  . Tdap 10/02/2009  . Varicella 03/03/2010    TDAP status: Due, Education has been provided regarding the importance of this vaccine. Advised may receive this vaccine at local pharmacy or Health Dept. Aware to provide a copy of the vaccination record if obtained from local pharmacy or Health Dept. Verbalized acceptance and understanding.  Flu Vaccine status: Up to date  Pneumococcal vaccine status: Up to date  Covid-19 vaccine status: Completed vaccines  Qualifies for Shingles Vaccine? Yes   Zostavax completed No   Shingrix Completed?: No.    Education has been provided regarding the importance of this vaccine. Patient has been advised to call insurance company to determine out of pocket expense if they have not yet received this vaccine. Advised may also receive vaccine at local pharmacy or Health Dept. Verbalized acceptance and understanding.  Screening Tests Health Maintenance  Topic Date Due  . Zoster Vaccines- Shingrix (1 of 2) Never done  . TETANUS/TDAP  10/03/2019  . COVID-19 Vaccine (3 - Booster for Moderna series) 12/18/2019  . INFLUENZA VACCINE  12/03/2020  . URINE MICROALBUMIN  02/09/2021  . MAMMOGRAM  11/01/2021  . COLONOSCOPY (Pts 45-20yrs Insurance coverage will need to be confirmed)  06/22/2023  . DEXA SCAN  Completed  . Hepatitis C Screening  Completed  . PNA vac Low Risk Adult  Completed  . HPV VACCINES  Aged Out    Health Maintenance  Health Maintenance Due  Topic Date Due  . Zoster Vaccines- Shingrix (1 of 2) Never done  . TETANUS/TDAP  10/03/2019  . COVID-19 Vaccine (3 - Booster for Moderna series) 12/18/2019    Colorectal cancer screening: Type of  screening: Colonoscopy. Completed 06/21/2018. Repeat every 5 years  Mammogram status: Ordered 09/19/2020. Pt provided with contact info and advised to call to schedule appt.   Bone Density status: Completed 10/04/2014. Results reflect: Bone density results: NORMAL. Repeat every 0 years.  Lung Cancer Screening: (Low Dose CT Chest recommended if Age 15-80 years, 30 pack-year currently smoking OR have quit w/in 15years.) does not qualify.   Lung Cancer Screening Referral: N/A   Additional Screening:  Hepatitis C Screening: does qualify; Completed 05/30/2015  Vision Screening: Recommended annual ophthalmology exams for early detection of glaucoma and other disorders of the eye. Is the patient up to date with their annual eye exam?  Yes  Who is the provider or what is the name of the office in which the patient attends annual eye exams? Dr. Brigitte Pulse If pt is not established with a provider, would they like to be referred to a provider to establish care? No .   Dental Screening: Recommended annual dental exams for proper oral hygiene  Community Resource Referral / Chronic Care Management: CRR required this visit?  No   CCM required this visit?  No      Plan:     I have personally reviewed and noted the following in the patient's chart:   . Medical and social history . Use of alcohol, tobacco or illicit drugs  . Current medications and supplements including opioid prescriptions.  . Functional ability and status . Nutritional status . Physical activity . Advanced directives . List of other physicians . Hospitalizations, surgeries, and ER visits in previous 12 months . Vitals . Screenings to include cognitive, depression, and falls . Referrals and appointments  In addition, I have reviewed  and discussed with patient certain preventive protocols, quality metrics, and best practice recommendations. A written personalized care plan for preventive services as well as general preventive  health recommendations were provided to patient.     Ofilia Neas, LPN   01/11/220   Nurse Notes: None

## 2020-10-03 ENCOUNTER — Ambulatory Visit (INDEPENDENT_AMBULATORY_CARE_PROVIDER_SITE_OTHER): Payer: Medicare Other

## 2020-10-03 DIAGNOSIS — Z0000 Encounter for general adult medical examination without abnormal findings: Secondary | ICD-10-CM | POA: Diagnosis not present

## 2020-10-03 NOTE — Patient Instructions (Signed)
[name] , Thank you for taking time to come for your Medicare Wellness Visit. I appreciate your ongoing commitment to your health goals. Please review the following plan we discussed and let me know if I can assist you in the future.   Screening recommendations/referrals: Colonoscopy: Up to date, next due 06/22/2023 Mammogram: Currently due, please keep appointment for 11/07/2020 Bone Density: No longer required Recommended yearly ophthalmology/optometry visit for glaucoma screening and checkup Recommended yearly dental visit for hygiene and checkup  Vaccinations: Influenza vaccine: Up to date, next due fall 2022  Pneumococcal vaccine: Completed series  Tdap vaccine: Currently due, you may await and injury to receive  Shingles vaccine: Currently due, if you would like to receive we recommend that you do so at your local pharmacy.    Advanced directives: Advance directive discussed with you today. Even though you declined this today please call our office should you change your mind and we can give you the proper paperwork for you to fill out.   Conditions/risks identified: None   Next appointment: None   Preventive Care 65 Years and Older, Female Preventive care refers to lifestyle choices and visits with your health care provider that can promote health and wellness. What does preventive care include?  A yearly physical exam. This is also called an annual well check.  Dental exams once or twice a year.  Routine eye exams. Ask your health care provider how often you should have your eyes checked.  Personal lifestyle choices, including:  Daily care of your teeth and gums.  Regular physical activity.  Eating a healthy diet.  Avoiding tobacco and drug use.  Limiting alcohol use.  Practicing safe sex.  Taking low-dose aspirin every day.  Taking vitamin and mineral supplements as recommended by your health care provider. What happens during an annual well check? The  services and screenings done by your health care provider during your annual well check will depend on your age, overall health, lifestyle risk factors, and family history of disease. Counseling  Your health care provider may ask you questions about your:  Alcohol use.  Tobacco use.  Drug use.  Emotional well-being.  Home and relationship well-being.  Sexual activity.  Eating habits.  History of falls.  Memory and ability to understand (cognition).  Work and work Statistician.  Reproductive health. Screening  You may have the following tests or measurements:  Height, weight, and BMI.  Blood pressure.  Lipid and cholesterol levels. These may be checked every 5 years, or more frequently if you are over 55 years old.  Skin check.  Lung cancer screening. You may have this screening every year starting at age 53 if you have a 30-pack-year history of smoking and currently smoke or have quit within the past 15 years.  Fecal occult blood test (FOBT) of the stool. You may have this test every year starting at age 69.  Flexible sigmoidoscopy or colonoscopy. You may have a sigmoidoscopy every 5 years or a colonoscopy every 10 years starting at age 78.  Hepatitis C blood test.  Hepatitis B blood test.  Sexually transmitted disease (STD) testing.  Diabetes screening. This is done by checking your blood sugar (glucose) after you have not eaten for a while (fasting). You may have this done every 1-3 years.  Bone density scan. This is done to screen for osteoporosis. You may have this done starting at age 31.  Mammogram. This may be done every 1-2 years. Talk to your health care provider  about how often you should have regular mammograms. Talk with your health care provider about your test results, treatment options, and if necessary, the need for more tests. Vaccines  Your health care provider may recommend certain vaccines, such as:  Influenza vaccine. This is recommended  every year.  Tetanus, diphtheria, and acellular pertussis (Tdap, Td) vaccine. You may need a Td booster every 10 years.  Zoster vaccine. You may need this after age 33.  Pneumococcal 13-valent conjugate (PCV13) vaccine. One dose is recommended after age 39.  Pneumococcal polysaccharide (PPSV23) vaccine. One dose is recommended after age 33. Talk to your health care provider about which screenings and vaccines you need and how often you need them. This information is not intended to replace advice given to you by your health care provider. Make sure you discuss any questions you have with your health care provider. Document Released: 05/18/2015 Document Revised: 01/09/2016 Document Reviewed: 02/20/2015 Elsevier Interactive Patient Education  2017 [employer] Prevention in the Home Falls can cause injuries. They can happen to people of all ages. There are many things you can do to make your home safe and to help prevent falls. What can I do on the outside of my home?  Regularly fix the edges of walkways and driveways and fix any cracks.  Remove anything that might make you trip as you walk through a door, such as a raised step or threshold.  Trim any bushes or trees on the path to your home.  Use bright outdoor lighting.  Clear any walking paths of anything that might make someone trip, such as rocks or tools.  Regularly check to see if handrails are loose or broken. Make sure that both sides of any steps have handrails.  Any raised decks and porches should have guardrails on the edges.  Have any leaves, snow, or ice cleared regularly.  Use sand or salt on walking paths during winter.  Clean up any spills in your garage right away. This includes oil or grease spills. What can I do in the bathroom?  Use night lights.  Install grab bars by the toilet and in the tub and shower. Do not use towel bars as grab bars.  Use non-skid mats or decals in the tub or shower.  If  you need to sit down in the shower, use a plastic, non-slip stool.  Keep the floor dry. Clean up any water that spills on the floor as soon as it happens.  Remove soap buildup in the tub or shower regularly.  Attach bath mats securely with double-sided non-slip rug tape.  Do not have throw rugs and other things on the floor that can make you trip. What can I do in the bedroom?  Use night lights.  Make sure that you have a light by your bed that is easy to reach.  Do not use any sheets or blankets that are too big for your bed. They should not hang down onto the floor.  Have a firm chair that has side arms. You can use this for support while you get dressed.  Do not have throw rugs and other things on the floor that can make you trip. What can I do in the kitchen?  Clean up any spills right away.  Avoid walking on wet floors.  Keep items that you use a lot in easy-to-reach places.  If you need to reach something above you, use a strong step stool that has a grab bar.  Keep electrical cords out of the way.  Do not use floor polish or wax that makes floors slippery. If you must use wax, use non-skid floor wax.  Do not have throw rugs and other things on the floor that can make you trip. What can I do with my stairs?  Do not leave any items on the stairs.  Make sure that there are handrails on both sides of the stairs and use them. Fix handrails that are broken or loose. Make sure that handrails are as long as the stairways.  Check any carpeting to make sure that it is firmly attached to the stairs. Fix any carpet that is loose or worn.  Avoid having throw rugs at the top or bottom of the stairs. If you do have throw rugs, attach them to the floor with carpet tape.  Make sure that you have a light switch at the top of the stairs and the bottom of the stairs. If you do not have them, ask someone to add them for you. What else can I do to help prevent falls?  Wear shoes  that:  Do not have high heels.  Have rubber bottoms.  Are comfortable and fit you well.  Are closed at the toe. Do not wear sandals.  If you use a stepladder:  Make sure that it is fully opened. Do not climb a closed stepladder.  Make sure that both sides of the stepladder are locked into place.  Ask someone to hold it for you, if possible.  Clearly mark and make sure that you can see:  Any grab bars or handrails.  First and last steps.  Where the edge of each step is.  Use tools that help you move around (mobility aids) if they are needed. These include:  Canes.  Walkers.  Scooters.  Crutches.  Turn on the lights when you go into a dark area. Replace any light bulbs as soon as they burn out.  Set up your furniture so you have a clear path. Avoid moving your furniture around.  If any of your floors are uneven, fix them.  If there are any pets around you, be aware of where they are.  Review your medicines with your doctor. Some medicines can make you feel dizzy. This can increase your chance of falling. Ask your doctor what other things that you can do to help prevent falls. This information is not intended to replace advice given to you by your health care provider. Make sure you discuss any questions you have with your health care provider. Document Released: 02/15/2009 Document Revised: 09/27/2015 Document Reviewed: 05/26/2014 Elsevier Interactive Patient Education  2017 [employer].

## 2020-10-05 ENCOUNTER — Encounter: Payer: Self-pay | Admitting: Nurse Practitioner

## 2020-10-05 ENCOUNTER — Other Ambulatory Visit: Payer: Self-pay

## 2020-10-05 ENCOUNTER — Ambulatory Visit (INDEPENDENT_AMBULATORY_CARE_PROVIDER_SITE_OTHER): Payer: Medicare Other | Admitting: Nurse Practitioner

## 2020-10-05 VITALS — BP 147/75 | HR 76 | Ht 66.0 in | Wt 210.0 lb

## 2020-10-05 DIAGNOSIS — E890 Postprocedural hypothyroidism: Secondary | ICD-10-CM | POA: Diagnosis not present

## 2020-10-05 NOTE — Progress Notes (Signed)
10/05/2020                       Endocrinology Follow Up Note    Subjective:    Patient ID: Danielle Howard, female    DOB: Oct 29, 1950, PCP Lindell Spar, MD   Past Medical History:  Diagnosis Date  . Arthritis   . Complication of anesthesia    nausea  . Diabetes mellitus without complication ([place])   . Headache   . Hypertension   . Hypothyroidism   . Hypothyroidism   . Iritis   . Thyroid disease    Past Surgical History:  Procedure Laterality Date  . ABDOMINAL HYSTERECTOMY    . CATARACT EXTRACTION Left 2019  . CHOLECYSTECTOMY     02/2012  . COLONOSCOPY N/A 05/25/2013   Procedure: COLONOSCOPY;  Surgeon: Rogene Houston, MD;  [facility];  Service: Endoscopy;  Laterality: N/A;  830-moved to 1055 Ann to notify pt  . COLONOSCOPY N/A 06/21/2018   Procedure: COLONOSCOPY;  Surgeon: Rogene Houston, MD;  Location: [facility];  Service: Endoscopy;  Laterality: N/A;  1020  . EYE SURGERY N/A    Phreesia 07/13/2020  . JOINT REPLACEMENT Bilateral    2012,2013-bilateral knees  . POLYPECTOMY  06/21/2018   Procedure: POLYPECTOMY;  Surgeon: Rogene Houston, MD;  Location: [facility];  Service: Endoscopy;;  . THYROID SURGERY     Social History   Socioeconomic History  . Marital status: Single    Spouse name: Not on file  . Number of children: Not on file  . Years of education: Not on file  . Highest education level: Not on file  Occupational History  . Not on file  Tobacco Use  . Smoking status: Never Smoker  . Smokeless tobacco: Never Used  Vaping Use  . Vaping Use: Never used  Substance and Sexual Activity  . Alcohol use: Yes    Comment: occasional  . Drug use: No  . Sexual activity: Yes  Other Topics Concern  . Not on file  Social History Narrative  . Not on file   Social Determinants of Health   Financial Resource Strain: Low Risk   . Difficulty of Paying Living Expenses: Not hard at all  Food Insecurity: No Food Insecurity  . Worried  About Charity fundraiser in the Last Year: Never true  . Ran Out of Food in the Last Year: Never true  Transportation Needs: No Transportation Needs  . Lack of Transportation (Medical): No  . Lack of Transportation (Non-Medical): No  Physical Activity: Inactive  . Days of Exercise per Week: 0 days  . Minutes of Exercise per Session: 0 min  Stress: No Stress Concern Present  . Feeling of Stress : Not at all  Social Connections: Moderately Isolated  . Frequency of Communication with Friends and Family: More than three times a week  . Frequency of Social Gatherings with Friends and Family: More than three times a week  . Attends Religious Services: More than 4 times per year  . Active Member of Clubs or Organizations: No  . Attends Archivist Meetings: Never  . Marital Status: Never married   Outpatient Encounter Medications as of 10/05/2020  Medication Sig  . acyclovir (ZOVIRAX) 800 MG tablet Take by mouth 2 (two) times daily.  . brimonidine (ALPHAGAN) 0.15 % ophthalmic solution Place 1 drop into both eyes 3 (three) times daily.  . Calcium Carbonate-Vit D-Min (CALCIUM  1200 PO) Take by mouth.  . cycloSPORINE modified (NEORAL) 50 MG capsule Take by mouth. Take (2) caps PO Q AM and (1) cap PO Q PM  . dorzolamide-timolol (COSOPT) 22.3-6.8 MG/ML ophthalmic solution 2 (two) times daily. Left eye only  . folic acid (FOLVITE) 1 MG tablet Take 1 mg by mouth daily.   Marland Kitchen levothyroxine (SYNTHROID) 150 MCG tablet TAKE 1 TABLET(150 MCG) BY MOUTH DAILY BEFORE BREAKFAST  . methazolamide (NEPTAZANE) 50 MG tablet Take by mouth 2 (two) times daily.  . methotrexate (RHEUMATREX) 2.5 MG tablet Take 25 mg by mouth every Wednesday. 10 tabs  . Multiple Vitamins-Minerals (MULTIVITAMIN WITH MINERALS) tablet Take 1 tablet by mouth daily.  . potassium chloride SA (KLOR-CON) 20 MEQ tablet TAKE 1 TABLET(20 MEQ) BY MOUTH TWICE DAILY (Patient taking differently: Take 20 mEq by mouth daily. TAKE 1 TABLET(20 MEQ)  BY MOUTH TWICE DAILY)  . prednisoLONE acetate (PRED FORTE) 1 % ophthalmic suspension 3 (three) times daily.  Marland Kitchen torsemide (DEMADEX) 20 MG tablet TAKE 1 TO 2 TABLETS BY MOUTH DAILY AS NEEDED FOR SWELLING  . verapamil (CALAN-SR) 240 MG CR tablet Take 1 tablet by mouth twice daily  . LUMIGAN 0.01 % SOLN Place 1 drop into the left eye at bedtime.  . [DISCONTINUED] latanoprost (XALATAN) 0.005 % ophthalmic solution Place 1 drop into the left eye at bedtime.   No facility-administered encounter medications on file as of 10/05/2020.   ALLERGIES: Allergies  Allergen Reactions  . Acetazolamide   . Dicloxacillin Other (See Comments)    GI upset   VACCINATION STATUS: Immunization History  Administered Date(s) Administered  . Fluad Quad(high Dose 65+) 01/13/2019  . Influenza Whole 02/09/2013  . Influenza, High Dose Seasonal PF 01/20/2017, 01/27/2018, 01/04/2020  . Influenza,inj,Quad PF,6+ Mos 01/15/2015, 01/25/2016  . Influenza-Unspecified 01/13/2014  . Moderna Sars-Covid-2 Vaccination 06/16/2019, 07/18/2019  . Pneumococcal Conjugate-13 01/15/2015  . Pneumococcal Polysaccharide-23 01/13/2014  . Tdap 10/02/2009  . Varicella 03/03/2010    Thyroid Problem Presents for follow-up visit. Symptoms include heat intolerance. Patient reports no anxiety, cold intolerance, constipation, depressed mood, diarrhea, fatigue, hair loss, palpitations, tremors, weight gain or weight loss. The symptoms have been stable.    She is on Levothyroxine150 mcg po qam. She reports improvement in her previous symptoms of fatigue.  She is currently assisting her elderly mother and father who live across the street from her.  -She sometimes takes her thyroid supplement with her other medications which can impact her absorption making it harder to maintain optimal control.  she denies dysphagia, SOB, nor voice change.     Review of systems  Constitutional: + Minimally fluctuating body weight,  current Body mass index is  33.89 kg/m. , no fatigue, + subjective hyperthermia, no subjective hypothermia Eyes: no blurry vision, no xerophthalmia ENT: no sore throat, no nodules palpated in throat, no dysphagia/odynophagia, no hoarseness Cardiovascular: no chest pain, no shortness of breath, no palpitations, no leg swelling Respiratory: no cough, no shortness of breath Gastrointestinal: no nausea/vomiting/diarrhea Musculoskeletal: no muscle/joint aches Skin: no rashes, no hyperemia Neurological: no tremors, no numbness, no tingling, no dizziness Psychiatric: no depression, no anxiety   Objective:    BP (!) 147/75   Pulse 76   Ht 5\' 6"  (1.676 m)   Wt 210 lb (95.3 kg)   BMI 33.89 kg/m   Wt Readings from Last 3 Encounters:  10/05/20 210 lb (95.3 kg)  08/15/20 207 lb (93.9 kg)  07/16/20 207 lb 1.9 oz (93.9 kg)  BP Readings from Last 3 Encounters:  10/05/20 (!) 147/75  08/15/20 140/70  07/16/20 (!) 153/71      Physical Exam- Limited  Constitutional:  Body mass index is 33.89 kg/m. , not in acute distress, normal state of mind Eyes:  EOMI, no exophthalmos Neck: Supple Cardiovascular: RRR, no murmurs, rubs, or gallops, no edema Respiratory: Adequate breathing efforts, no crackles, rales, rhonchi, or wheezing Musculoskeletal: no gross deformities, strength intact in all four extremities, no gross restriction of joint movements Skin:  no rashes, no hyperemia Neurological: no tremor with outstretched hands    CMP     Component Value Date/Time   NA 136 07/05/2020 1928   K 4.4 07/05/2020 1928   CL 103 07/05/2020 1928   CO2 21 (L) 07/05/2020 1928   GLUCOSE 109 (H) 07/05/2020 1928   BUN 35 (H) 07/05/2020 1928   CREATININE 2.20 (H) 07/05/2020 1928   CREATININE 1.28 (H) 02/10/2020 0926   CALCIUM 9.4 07/05/2020 1928   PROT 8.6 (H) 07/05/2020 1928   ALBUMIN 3.6 07/05/2020 1928   AST 33 07/05/2020 1928   ALT 10 07/05/2020 1928   ALKPHOS 104 07/05/2020 1928   BILITOT 0.9 07/05/2020 1928    GFRNONAA 23 (L) 07/05/2020 1928   GFRNONAA 41 (L) 03/28/2019 0744   GFRAA 48 (L) 03/28/2019 0744     Diabetic Labs (most recent): Lab Results  Component Value Date   HGBA1C 5.3 02/10/2020   HGBA1C 5.6 08/08/2019   HGBA1C 5.6 03/28/2019     Lipid Panel ( most recent) Lipid Panel     Component Value Date/Time   CHOL 176 02/10/2020 0926   TRIG 106 02/10/2020 0926   HDL 68 02/10/2020 0926   CHOLHDL 2.6 02/10/2020 0926   VLDL 17 07/15/2016 0859   LDLCALC 88 02/10/2020 0926    Results for LAINI, URICK (MRN [id number]) as of 10/05/2020 11:06  Ref. Range 08/08/2019 10:09 02/10/2020 09:26 04/02/2020 07:31 09/26/2020 09:57  TSH Latest Ref Range: 0.450 - 4.500 uIU/mL 1.09 4.57 (H) 0.55 0.158 (L)  T4,Free(Direct) Latest Ref Range: 0.82 - 1.77 ng/dL 1.4 1.4 1.4 1.77      Assessment & Plan:   1. 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  She is advised to continue Levothyroxine 150 mcg po daily before breakfast and skip her dose on Sundays (she did not want to lower her dose due to cost).   - We discussed about the correct intake of her thyroid hormone, on empty stomach at fasting, with water, separated by at least 30 minutes from breakfast and other medications,  and separated by more than 4 hours from calcium, iron, multivitamins, acid reflux medications (PPIs). -Patient is made aware of the fact that thyroid hormone replacement is needed for life, dose to be adjusted by periodic monitoring of thyroid function tests.  Her last thyroid u/s from 01/2017 showed normal size right lobe with no nodules.- no need for additional follow up at this time.   - I advised patient to maintain close follow up with Lindell Spar, MD for primary care needs.     I spent 20 minutes in the care of the patient today including review of labs from Thyroid Function, CMP, and other relevant labs ;  imaging/biopsy records (current and previous including abstractions from other facilities); face-to-face time discussing  her lab results and symptoms, medications doses, her options of short and long term treatment based on the latest standards  of care / guidelines;   and documenting the encounter.  Danielle Howard  participated in the discussions, expressed understanding, and voiced agreement with the above plans.  All questions were answered to her satisfaction. she is encouraged to contact clinic should she have any questions or concerns prior to her return visit.   Follow up plan: Return in about 6 months (around 04/06/2021) for Thyroid follow up, Previsit labs.  Rayetta Pigg, [facility] [address] [place], [place] [address] Phone: [phone] Fax: [phone]   10/05/2020, 11:22 AM

## 2020-10-05 NOTE — Patient Instructions (Signed)

## 2020-10-25 DIAGNOSIS — Z79899 Other long term (current) drug therapy: Secondary | ICD-10-CM | POA: Diagnosis not present

## 2020-10-25 DIAGNOSIS — H20042 Secondary noninfectious iridocyclitis, left eye: Secondary | ICD-10-CM | POA: Diagnosis not present

## 2020-10-25 DIAGNOSIS — H4042X3 Glaucoma secondary to eye inflammation, left eye, severe stage: Secondary | ICD-10-CM | POA: Diagnosis not present

## 2020-11-07 ENCOUNTER — Ambulatory Visit (HOSPITAL_COMMUNITY): Payer: Medicare Other

## 2020-11-09 ENCOUNTER — Ambulatory Visit (HOSPITAL_COMMUNITY)
Admission: RE | Admit: 2020-11-09 | Discharge: 2020-11-09 | Disposition: A | Discharge status: Home / Self Care | Payer: Medicare Other | Source: Ambulatory Visit | Attending: Internal Medicine | Admitting: Internal Medicine

## 2020-11-09 ENCOUNTER — Other Ambulatory Visit: Payer: Self-pay

## 2020-11-09 DIAGNOSIS — Z1231 Encounter for screening mammogram for malignant neoplasm of breast: Secondary | ICD-10-CM | POA: Diagnosis not present

## 2020-11-12 ENCOUNTER — Ambulatory Visit: Payer: Medicare Other | Admitting: Internal Medicine

## 2020-11-23 ENCOUNTER — Ambulatory Visit: Payer: Medicare Other

## 2020-11-23 ENCOUNTER — Other Ambulatory Visit: Payer: Self-pay

## 2020-11-23 DIAGNOSIS — Z20822 Contact with and (suspected) exposure to covid-19: Secondary | ICD-10-CM

## 2020-11-25 LAB — SARS-COV-2, NAA 2 DAY TAT

## 2020-11-25 LAB — NOVEL CORONAVIRUS, NAA: SARS-CoV-2, NAA: NOT DETECTED

## 2020-12-10 ENCOUNTER — Other Ambulatory Visit: Payer: Self-pay

## 2020-12-10 ENCOUNTER — Ambulatory Visit (INDEPENDENT_AMBULATORY_CARE_PROVIDER_SITE_OTHER): Payer: Medicare Other | Admitting: Internal Medicine

## 2020-12-10 ENCOUNTER — Encounter: Payer: Self-pay | Admitting: Internal Medicine

## 2020-12-10 VITALS — BP 126/67 | HR 81 | Temp 98.1°F | Resp 18 | Ht 66.0 in | Wt 214.1 lb

## 2020-12-10 DIAGNOSIS — N1832 Chronic kidney disease, stage 3b: Secondary | ICD-10-CM | POA: Diagnosis not present

## 2020-12-10 DIAGNOSIS — D1721 Benign lipomatous neoplasm of skin and subcutaneous tissue of right arm: Secondary | ICD-10-CM | POA: Insufficient documentation

## 2020-12-10 DIAGNOSIS — I890 Lymphedema, not elsewhere classified: Secondary | ICD-10-CM | POA: Insufficient documentation

## 2020-12-10 DIAGNOSIS — E890 Postprocedural hypothyroidism: Secondary | ICD-10-CM

## 2020-12-10 DIAGNOSIS — H209 Unspecified iridocyclitis: Secondary | ICD-10-CM

## 2020-12-10 DIAGNOSIS — I10 Essential (primary) hypertension: Secondary | ICD-10-CM | POA: Diagnosis not present

## 2020-12-10 NOTE — Assessment & Plan Note (Signed)
Last BMP showed AKI on CKD, likely due to underlying UTI Advised to maintain proper hydration Avoid Demadex more than 1 tablet Avoid nephrotoxic agents Based on today's BMP, will decide for Nephrology referral

## 2020-12-10 NOTE — Assessment & Plan Note (Signed)
Follows up with Ophthalmology On Methotrexate

## 2020-12-10 NOTE — Assessment & Plan Note (Signed)
Compression stocking Lymphedema pump PRN Demadex PRN, although advised to use cautiously to avoid risk of dehydration Leg elevation 

## 2020-12-10 NOTE — Assessment & Plan Note (Signed)
On Levothyroxine 150 mcg QD Follows up with Endocrinology - Dr Nida 

## 2020-12-10 NOTE — Patient Instructions (Signed)
Please use lymphedema pump and compression stocking for leg edema. Avoid taking more than 1 tablet of Torsemide.  Please continue to take other medications as prescribed.  Please follow low salt diet. Please eat at regular intervals and avoid skipping any meal.

## 2020-12-10 NOTE — Assessment & Plan Note (Signed)
BP Readings from Last 1 Encounters:  12/10/20 126/67   Well-controlled with Verapamil Counseled for compliance with the medications Advised DASH diet and moderate exercise/walking as tolerated

## 2020-12-10 NOTE — Assessment & Plan Note (Addendum)
Appears to be benign lump, likely lipoma Unrelated to recent fall Monitor for now Advised to contact if they grow or cause pain

## 2020-12-10 NOTE — Progress Notes (Signed)
Established Patient Office Visit  Subjective:  Patient ID: Danielle Howard, female    DOB: [birth year]  Age: 70 y.o. MRN: [id number]  CC:  Chief Complaint  Patient presents with   Follow-up    4 month follow up feet have been swelling since weekend she has not been taking fluid pill she a knot on right arm she did have a fall about 1 week ago then noticed this     HPI [name] is a 70 year old female with PMH of HTN, PVD, hypothyroidism, gluacoma, uveitis and obesity who presents for follow up of her chronic medical conditions.  BP is well-controlled. Takes medications regularly. Patient denies headache, dizziness, chest pain, dyspnea or palpitations.  She had a recent mechanical fall at home, denies any prodromal symptoms. Denies any head injury. She fell on a carpet floor. She has noticed 2 bumps over her right forearm recently and thought they were related to her fall.  She has h/o hypothyroidism and takes Levothyroxine 150 mcg QD, except 'Sunday. She follows up with Dr Nida.   She has had multiple complications since she had cataract surgery. She is using multiple eye drops for uveitis and glaucoma. She is on Methotrexate for uveitis.  Past Medical History:  Diagnosis Date   Arthritis    Complication of anesthesia    nausea   Diabetes mellitus without complication (HCC)    Headache    Hypertension    Hypothyroidism    Hypothyroidism    Iritis    Thyroid disease    Uveitis of left eye 12/29/2017    Past Surgical History:  Procedure Laterality Date   ABDOMINAL HYSTERECTOMY     CATARACT EXTRACTION Left 2019   CHOLECYSTECTOMY     10' /2013   COLONOSCOPY N/A 05/25/2013   Procedure: COLONOSCOPY;  Surgeon: Rogene Houston, MD;  [facility];  Service: Endoscopy;  Laterality: N/A;  830-moved to 1055 Ann to notify pt   COLONOSCOPY N/A 06/21/2018   Procedure: COLONOSCOPY;  Surgeon: Rogene Houston, MD;  [facility]: [facility];  Service: Endoscopy;  Laterality: N/A;   1020   EYE SURGERY N/A    [employer]reesia 07/13/2020   JOINT REPLACEMENT Bilateral    2012,2013-bilateral knees   POLYPECTOMY  06/21/2018   Procedure: POLYPECTOMY;  Surgeon: Rogene Houston, MD;  [facility];  Service: Endoscopy;;   THYROID SURGERY      Family History  Problem Relation Age of Onset   Arthritis Mother    Heart disease Mother    Hypertension Mother    Arthritis Father    Hyperlipidemia Father    HIV/AIDS Brother     Social History   Socioeconomic History   Marital status: Single    Spouse name: Not on file   Number of children: Not on file   Years of education: Not on file   Highest education level: Not on file  Occupational History   Not on file  Tobacco Use   Smoking status: Never   Smokeless tobacco: Never  Vaping Use   Vaping Use: Never used  Substance and Sexual Activity   Alcohol use: Yes    Comment: occasional   Drug use: No   Sexual activity: Yes  Other Topics Concern   Not on file  Social History Narrative   Not on file   Social Determinants of Health   Financial Resource Strain: Low Risk    Difficulty of Paying Living Expenses: Not hard at all  Food Insecurity: No Food Insecurity   Worried About Charity fundraiser in the Last Year: Never true   Ran Out of Food in the Last Year: Never true  Transportation Needs: No Transportation Needs   Lack of Transportation (Medical): No   Lack of Transportation (Non-Medical): No  Physical Activity: Inactive   Days of Exercise per Week: 0 days   Minutes of Exercise per Session: 0 min  Stress: No Stress Concern Present   Feeling of Stress : Not at all  Social Connections: Moderately Isolated   Frequency of Communication with Friends and Family: More than three times a week   Frequency of Social Gatherings with Friends and Family: More than three times a week   Attends Religious Services: More than 4 times per year   Active Member of [employer] or Organizations: No   Attends Theatre manager Meetings: Never   Marital Status: Never married  Human resources officer Violence: Not At Risk   Fear of Current or Ex-Partner: No   Emotionally Abused: No   Physically Abused: No   Sexually Abused: No    Outpatient Medications Prior to Visit  Medication Sig Dispense Refill   acyclovir (ZOVIRAX) 800 MG tablet Take by mouth 2 (two) times daily.     brimonidine (ALPHAGAN) 0.15 % ophthalmic solution Place 1 drop into both eyes 3 (three) times daily. 5 mL 12   Calcium Carbonate-Vit D-Min (CALCIUM 1200 PO) Take by mouth.     cycloSPORINE modified (NEORAL) 50 MG capsule Take by mouth. Take (2) caps PO Q AM and (1) cap PO Q PM     dorzolamide-timolol (COSOPT) 22.3-6.8 MG/ML ophthalmic solution 2 (two) times daily. Left eye only     folic acid (FOLVITE) 1 MG tablet Take 1 mg by mouth daily.      levothyroxine (SYNTHROID) 150 MCG tablet TAKE 1 TABLET(150 MCG) BY MOUTH DAILY BEFORE BREAKFAST 90 tablet 1   LUMIGAN 0.01 % SOLN Place 1 drop into the left eye at bedtime.     methotrexate (RHEUMATREX) 2.5 MG tablet Take 25 mg by mouth every Wednesday. 10 tabs     Multiple Vitamins-Minerals (MULTIVITAMIN WITH MINERALS) tablet Take 1 tablet by mouth daily.     potassium chloride SA (KLOR-CON) 20 MEQ tablet TAKE 1 TABLET(20 MEQ) BY MOUTH TWICE DAILY (Patient taking differently: Take 20 mEq by mouth daily. TAKE 1 TABLET(20 MEQ) BY MOUTH TWICE DAILY) 180 tablet 3   prednisoLONE acetate (PRED FORTE) 1 % ophthalmic suspension 3 (three) times daily.     torsemide (DEMADEX) 20 MG tablet TAKE 1 TO 2 TABLETS BY MOUTH DAILY AS NEEDED FOR SWELLING (Patient taking differently: Take 20 mg by mouth daily as needed.) 180 tablet 2   verapamil (CALAN-SR) 240 MG CR tablet Take 1 tablet by mouth twice daily 180 tablet 1   methazolamide (NEPTAZANE) 50 MG tablet Take by mouth 2 (two) times daily.     No facility-administered medications prior to visit.    Allergies  Allergen Reactions   Acetazolamide     Dicloxacillin Other (See Comments)    GI upset    ROS Review of Systems  Constitutional:  Negative for chills and fever.  HENT:  Negative for congestion, sinus pressure, sinus pain and sore throat.   Eyes:  Positive for pain. Negative for discharge.  Respiratory:  Negative for cough and shortness of breath.   Cardiovascular:  Positive for leg swelling. Negative for chest pain and palpitations.  Gastrointestinal:  Negative for abdominal  pain, constipation, diarrhea, nausea and vomiting.  Endocrine: Negative for polydipsia and polyuria.  Genitourinary:  Negative for dysuria and hematuria.  Musculoskeletal:  Negative for neck pain and neck stiffness.  Skin:  Negative for rash.  Neurological:  Negative for dizziness and weakness.  Psychiatric/Behavioral:  Negative for agitation and behavioral problems.      Objective:    Physical Exam Vitals reviewed.  Constitutional:      General: She is not in acute distress.    Appearance: She is not diaphoretic.  HENT:     Head: Normocephalic and atraumatic.     Nose: Nose normal.     Mouth/Throat:     Mouth: Mucous membranes are moist.  Eyes:     General: No scleral icterus.    Extraocular Movements: Extraocular movements intact.  Cardiovascular:     Rate and Rhythm: Normal rate and regular rhythm.     Pulses: Normal pulses.     Heart sounds: Normal heart sounds. No murmur heard. Pulmonary:     Breath sounds: Normal breath sounds. No wheezing or rales.  Musculoskeletal:     Cervical back: Neck supple. No tenderness.     Right lower leg: Edema (2+) present.     Left lower leg: Edema (2+) present.  Skin:    General: Skin is warm.     Findings: No rash.     Comments: 2 masses noted over right forearm - about 1 cm and 2 cm in diameter, nontender, likely lipoma  Neurological:     General: No focal deficit present.     Mental Status: She is alert and oriented to person, place, and time.  Psychiatric:        Mood and Affect: Mood  normal.        Behavior: Behavior normal.    BP 126/67 (BP Location: Left Arm, Patient Position: Sitting, Cuff Size: Normal)   Pulse 81   Temp 98.1 F (36.7 C) (Oral)   Resp 18   Ht '5\' 6"'  (1.676 m)   Wt 214 lb 1.9 oz (97.1 kg)   SpO2 96%   BMI 34.56 kg/m  Wt Readings from Last 3 Encounters:  12/10/20 214 lb 1.9 oz (97.1 kg)  10/05/20 210 lb (95.3 kg)  08/15/20 207 lb (93.9 kg)     Health Maintenance Due  Topic Date Due   Zoster Vaccines- Shingrix (1 of 2) Never done   COVID-19 Vaccine (3 - Moderna risk series) 08/15/2019   TETANUS/TDAP  10/03/2019   INFLUENZA VACCINE  12/03/2020   URINE MICROALBUMIN  02/09/2021    There are no preventive care reminders to display for this patient.  Lab Results  Component Value Date   TSH 0.158 (L) 09/26/2020   Lab Results  Component Value Date   WBC 14.1 (H) 07/05/2020   HGB 10.7 (L) 07/05/2020   HCT 35.5 (L) 07/05/2020   MCV 103.5 (H) 07/05/2020   PLT 490 (H) 07/05/2020   Lab Results  Component Value Date   NA 136 07/05/2020   K 4.4 07/05/2020   CO2 21 (L) 07/05/2020   GLUCOSE 109 (H) 07/05/2020   BUN 35 (H) 07/05/2020   CREATININE 2.20 (H) 07/05/2020   BILITOT 0.9 07/05/2020   ALKPHOS 104 07/05/2020   AST 33 07/05/2020   ALT 10 07/05/2020   PROT 8.6 (H) 07/05/2020   ALBUMIN 3.6 07/05/2020   CALCIUM 9.4 07/05/2020   ANIONGAP 12 07/05/2020   Lab Results  Component Value Date   CHOL 176 02/10/2020  Lab Results  Component Value Date   HDL 68 02/10/2020   Lab Results  Component Value Date   LDLCALC 88 02/10/2020   Lab Results  Component Value Date   TRIG 106 02/10/2020   Lab Results  Component Value Date   CHOLHDL 2.6 02/10/2020   Lab Results  Component Value Date   HGBA1C 5.3 02/10/2020      Assessment & Plan:   Problem List Items Addressed This Visit       Cardiovascular and Mediastinum   Essential hypertension, benign - Primary    BP Readings from Last 1 Encounters:  12/10/20 126/67   Well-controlled with Verapamil Counseled for compliance with the medications Advised DASH diet and moderate exercise/walking as tolerated       Relevant Orders   CBC with Differential/Platelet   CMP14+EGFR     Endocrine   Hypothyroidism    On Levothyroxine 150 mcg QD Follows up with Endocrinology - Dr Dorris Fetch       Relevant Orders   TSH + free T4     Genitourinary   Chronic kidney disease (CKD), stage III (moderate) (HCC)    Last BMP showed AKI on CKD, likely due to underlying UTI Advised to maintain proper hydration Avoid Demadex more than 1 tablet Avoid nephrotoxic agents Based on today's BMP, will decide for Nephrology referral         Other   Uveitis    Follows up with Ophthalmology On Methotrexate       Lymphedema    Compression stocking Lymphedema pump PRN Demadex PRN, although advised to use cautiously to avoid risk of dehydration Leg elevation       Lipoma of right upper extremity    Appears to be benign lump, likely lipoma Unrelated to recent fall Monitor for now Advised to contact if they grow or cause pain        No orders of the defined types were placed in this encounter.   Follow-up: Return in about 4 months (around 04/11/2021) for HTN and CKD.    Lindell Spar, MD

## 2020-12-11 ENCOUNTER — Telehealth: Payer: Self-pay

## 2020-12-11 ENCOUNTER — Other Ambulatory Visit: Payer: Self-pay | Admitting: Internal Medicine

## 2020-12-11 DIAGNOSIS — Z23 Encounter for immunization: Secondary | ICD-10-CM | POA: Diagnosis not present

## 2020-12-11 DIAGNOSIS — N1832 Chronic kidney disease, stage 3b: Secondary | ICD-10-CM

## 2020-12-11 LAB — CMP14+EGFR
ALT: 17 IU/L (ref 0–32)
AST: 27 IU/L (ref 0–40)
Albumin/Globulin Ratio: 1.7 (ref 1.2–2.2)
Albumin: 4.5 g/dL (ref 3.7–4.7)
Alkaline Phosphatase: 118 IU/L (ref 44–121)
BUN/Creatinine Ratio: 24 (ref 12–28)
BUN: 32 mg/dL — ABNORMAL HIGH (ref 8–27)
Bilirubin Total: 0.9 mg/dL (ref 0.0–1.2)
CO2: 25 mmol/L (ref 20–29)
Calcium: 9.4 mg/dL (ref 8.7–10.3)
Chloride: 104 mmol/L (ref 96–106)
Creatinine, Ser: 1.33 mg/dL — ABNORMAL HIGH (ref 0.57–1.00)
Globulin, Total: 2.7 g/dL (ref 1.5–4.5)
Glucose: 111 mg/dL — ABNORMAL HIGH (ref 65–99)
Potassium: 4.4 mmol/L (ref 3.5–5.2)
Sodium: 144 mmol/L (ref 134–144)
Total Protein: 7.2 g/dL (ref 6.0–8.5)
eGFR: 43 mL/min/{1.73_m2} — ABNORMAL LOW (ref >59)

## 2020-12-11 LAB — CBC WITH DIFFERENTIAL/PLATELET
Basophils Absolute: 0 10*3/uL (ref 0.0–0.2)
Basos: 1 %
EOS (ABSOLUTE): 0.2 10*3/uL (ref 0.0–0.4)
Eos: 4 %
Hematocrit: 38.3 % (ref 34.0–46.6)
Hemoglobin: 12.5 g/dL (ref 11.1–15.9)
Immature Grans (Abs): 0 10*3/uL (ref 0.0–0.1)
Immature Granulocytes: 0 %
Lymphocytes Absolute: 1.2 10*3/uL (ref 0.7–3.1)
Lymphs: 27 %
MCH: 32.2 pg (ref 26.6–33.0)
MCHC: 32.6 g/dL (ref 31.5–35.7)
MCV: 99 fL — ABNORMAL HIGH (ref 79–97)
Monocytes Absolute: 0.2 10*3/uL (ref 0.1–0.9)
Monocytes: 5 %
Neutrophils Absolute: 2.7 10*3/uL (ref 1.4–7.0)
Neutrophils: 63 %
Platelets: 310 10*3/uL (ref 150–450)
RBC: 3.88 x10E6/uL (ref 3.77–5.28)
RDW: 15 % (ref 11.7–15.4)
WBC: 4.3 10*3/uL (ref 3.4–10.8)

## 2020-12-11 LAB — TSH+FREE T4
Free T4: 1.51 ng/dL (ref 0.82–1.77)
TSH: 1.12 u[IU]/mL (ref 0.450–4.500)

## 2020-12-11 NOTE — Telephone Encounter (Signed)
Patient returning call about labs.

## 2020-12-11 NOTE — Telephone Encounter (Signed)
Pt advised of lab results with verbal understanding  

## 2020-12-21 DIAGNOSIS — H35372 Puckering of macula, left eye: Secondary | ICD-10-CM | POA: Diagnosis not present

## 2020-12-21 DIAGNOSIS — Z961 Presence of intraocular lens: Secondary | ICD-10-CM | POA: Diagnosis not present

## 2020-12-21 DIAGNOSIS — T8521XD Breakdown (mechanical) of intraocular lens, subsequent encounter: Secondary | ICD-10-CM | POA: Diagnosis not present

## 2020-12-21 DIAGNOSIS — H35033 Hypertensive retinopathy, bilateral: Secondary | ICD-10-CM | POA: Diagnosis not present

## 2020-12-21 DIAGNOSIS — H2511 Age-related nuclear cataract, right eye: Secondary | ICD-10-CM | POA: Diagnosis not present

## 2020-12-21 DIAGNOSIS — H20042 Secondary noninfectious iridocyclitis, left eye: Secondary | ICD-10-CM | POA: Diagnosis not present

## 2020-12-21 DIAGNOSIS — Z79899 Other long term (current) drug therapy: Secondary | ICD-10-CM | POA: Diagnosis not present

## 2020-12-21 DIAGNOSIS — E1136 Type 2 diabetes mellitus with diabetic cataract: Secondary | ICD-10-CM | POA: Diagnosis not present

## 2020-12-27 DIAGNOSIS — H4042X3 Glaucoma secondary to eye inflammation, left eye, severe stage: Secondary | ICD-10-CM | POA: Diagnosis not present

## 2021-01-03 DIAGNOSIS — R718 Other abnormality of red blood cells: Secondary | ICD-10-CM | POA: Diagnosis not present

## 2021-01-03 DIAGNOSIS — N1832 Chronic kidney disease, stage 3b: Secondary | ICD-10-CM | POA: Diagnosis not present

## 2021-01-03 DIAGNOSIS — I129 Hypertensive chronic kidney disease with stage 1 through stage 4 chronic kidney disease, or unspecified chronic kidney disease: Secondary | ICD-10-CM | POA: Diagnosis not present

## 2021-01-03 DIAGNOSIS — Z79899 Other long term (current) drug therapy: Secondary | ICD-10-CM | POA: Diagnosis not present

## 2021-01-03 DIAGNOSIS — R809 Proteinuria, unspecified: Secondary | ICD-10-CM | POA: Diagnosis not present

## 2021-01-03 DIAGNOSIS — R600 Localized edema: Secondary | ICD-10-CM | POA: Diagnosis not present

## 2021-01-08 ENCOUNTER — Other Ambulatory Visit (HOSPITAL_COMMUNITY): Payer: Self-pay | Admitting: Nephrology

## 2021-01-08 DIAGNOSIS — E559 Vitamin D deficiency, unspecified: Secondary | ICD-10-CM | POA: Diagnosis not present

## 2021-01-08 DIAGNOSIS — E1122 Type 2 diabetes mellitus with diabetic chronic kidney disease: Secondary | ICD-10-CM | POA: Diagnosis not present

## 2021-01-08 DIAGNOSIS — N189 Chronic kidney disease, unspecified: Secondary | ICD-10-CM | POA: Diagnosis not present

## 2021-01-08 DIAGNOSIS — E1129 Type 2 diabetes mellitus with other diabetic kidney complication: Secondary | ICD-10-CM | POA: Diagnosis not present

## 2021-01-08 DIAGNOSIS — R718 Other abnormality of red blood cells: Secondary | ICD-10-CM | POA: Diagnosis not present

## 2021-01-08 DIAGNOSIS — R809 Proteinuria, unspecified: Secondary | ICD-10-CM | POA: Diagnosis not present

## 2021-01-08 DIAGNOSIS — I129 Hypertensive chronic kidney disease with stage 1 through stage 4 chronic kidney disease, or unspecified chronic kidney disease: Secondary | ICD-10-CM | POA: Diagnosis not present

## 2021-01-08 DIAGNOSIS — I10 Essential (primary) hypertension: Secondary | ICD-10-CM

## 2021-01-08 DIAGNOSIS — Z79899 Other long term (current) drug therapy: Secondary | ICD-10-CM | POA: Diagnosis not present

## 2021-01-10 ENCOUNTER — Other Ambulatory Visit (HOSPITAL_COMMUNITY): Payer: Self-pay | Admitting: Nephrology

## 2021-01-10 DIAGNOSIS — I129 Hypertensive chronic kidney disease with stage 1 through stage 4 chronic kidney disease, or unspecified chronic kidney disease: Secondary | ICD-10-CM

## 2021-01-11 ENCOUNTER — Telehealth: Payer: Self-pay

## 2021-01-11 ENCOUNTER — Other Ambulatory Visit: Payer: Self-pay

## 2021-01-11 ENCOUNTER — Ambulatory Visit (INDEPENDENT_AMBULATORY_CARE_PROVIDER_SITE_OTHER): Payer: Medicare Other

## 2021-01-11 ENCOUNTER — Other Ambulatory Visit: Payer: Self-pay | Admitting: Internal Medicine

## 2021-01-11 DIAGNOSIS — L304 Erythema intertrigo: Secondary | ICD-10-CM

## 2021-01-11 DIAGNOSIS — Z23 Encounter for immunization: Secondary | ICD-10-CM | POA: Diagnosis not present

## 2021-01-11 MED ORDER — KETOCONAZOLE 2 % EX CREA: 1.0000 "application " | TOPICAL_CREAM | Freq: Every day | CUTANEOUS | 0 refills | Status: DC

## 2021-01-11 NOTE — Telephone Encounter (Signed)
Pt informed

## 2021-01-11 NOTE — Telephone Encounter (Signed)
Pt came in for a flu shot this morning and states that she has had a rash underneath both breast for a few weeks and it is itching and looks like yeast. I offered a work in appt and pt declined she did not have time. Wanted to know if there was anything you recommended?

## 2021-01-19 ENCOUNTER — Ambulatory Visit
Admission: EM | Admit: 2021-01-19 | Discharge: 2021-01-19 | Disposition: A | Discharge status: Home / Self Care | Payer: Medicare Other | Attending: Family Medicine | Admitting: Family Medicine

## 2021-01-19 ENCOUNTER — Other Ambulatory Visit: Payer: Self-pay

## 2021-01-19 DIAGNOSIS — B372 Candidiasis of skin and nail: Secondary | ICD-10-CM

## 2021-01-19 MED ORDER — FLUCONAZOLE 200 MG PO TABS: 200.0000 mg | ORAL_TABLET | Freq: Every day | ORAL | 0 refills | Status: AC

## 2021-01-19 MED ORDER — NYSTATIN 100000 UNIT/GM EX CREA: TOPICAL_CREAM | CUTANEOUS | 0 refills | Status: DC

## 2021-01-19 MED ORDER — NYSTATIN 100000 UNIT/GM EX POWD: 1.0000 "application " | Freq: Three times a day (TID) | CUTANEOUS | 0 refills | Status: DC | PRN

## 2021-01-19 NOTE — ED Provider Notes (Signed)
[facility]    CSN: [id number] Arrival date & time: 01/19/21  1337      History   Chief Complaint Chief Complaint  Patient presents with   Rash    HPI Danielle Howard is a 70 y.o. female.   HPI Rash x 1 month under the breast under both x few days. PCP prescribed ketoconazole which did not improve rash and rash appears to worsening. She is prediabetic and is currently not taking medication for diabetes. Reports sweating a lot under breast, tried OTC powder without relief of symptoms. Denies the presence of a rash any other portion of the body.  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, severe stage 07/04/2020   Chronic kidney disease (CKD), stage III (moderate) ([place]) 04/04/2019   Gout 10/18/2018   Mixed hyperlipidemia 07/30/2018   Hx of colonic polyps 05/20/2018   Acute iritis, left eye 04/07/2018   Macular pucker, left eye 12/29/2017   Nuclear sclerotic cataract of right eye 12/29/2017   Uveitis 12/29/2017   PVD (peripheral vascular disease) ([place]) 11/24/2016   Osteoarthritis, hand 10/01/2016   Hypothyroidism 01/23/2016   Prediabetes 03/07/2013   Hashimoto's thyroiditis 01/03/2013   Obesity (BMI 30-39.9) 01/03/2013   Essential hypertension, benign 01/03/2013    Past Surgical History:  Procedure Laterality Date   ABDOMINAL HYSTERECTOMY     CATARACT EXTRACTION Left 2019   CHOLECYSTECTOMY     02/2012   COLONOSCOPY N/A 05/25/2013   Procedure: COLONOSCOPY;  Surgeon: Rogene Houston, MD;  [facility];  Service: Endoscopy;  Laterality: N/A;   830-moved to 1055 Ann to notify pt   COLONOSCOPY N/A 06/21/2018   Procedure: COLONOSCOPY;  Surgeon: Rogene Houston, MD;  [facility];  Service: Endoscopy;  Laterality: N/A;  1020   EYE SURGERY N/A    [employer]reesia 07/13/2020   JOINT REPLACEMENT Bilateral    2012,2013-bilateral knees   POLYPECTOMY  06/21/2018   Procedure: POLYPECTOMY;  Surgeon: Rogene Houston, MD;  [facility];  Service: Endoscopy;;   THYROID SURGERY      OB History   No obstetric history on file.      Home Medications    Prior to Admission medications   Medication Sig Start Date End Date Taking? Authorizing Provider  fluconazole (DIFLUCAN) 200 MG tablet Take 1 tablet (200 mg total) by mouth daily for 7 days. 01/19/21 01/26/21 Yes Scot Jun, FNP  nystatin (MYCOSTATIN/NYSTOP) powder Apply 1 application topically 3 (three) times daily as needed. 01/19/21  Yes Scot Jun, FNP  nystatin cream (MYCOSTATIN) Apply to affected area 2 times daily 01/19/21  Yes Scot Jun, FNP  acyclovir (ZOVIRAX) 800 MG tablet Take by mouth 2 (two) times daily. 03/20/20   [provider]  brimonidine (ALPHAGAN) 0.15 % ophthalmic solution Place 1 drop into both eyes 3 (three) times daily. 02/10/20   [name], MD  Calcium Carbonate-Vit D-Min (CALCIUM 1200 PO) Take by mouth.    [provider]  cycloSPORINE modified (NEORAL) 50 MG capsule Take by mouth. Take (2) caps PO Q AM and (1) cap PO Q PM 09/19/18   [provider]  dorzolamide-timolol (COSOPT) 22.3-6.8 MG/ML ophthalmic solution 2 (two) times daily. Left eye only 03/20/20   [provider]  folic acid (FOLVITE) 1 MG tablet Take 1 mg by mouth daily.  01/25/18   [provider]  levothyroxine (SYNTHROID) 150 MCG tablet TAKE 1 TABLET(150 MCG) BY MOUTH DAILY BEFORE AND BREAKFAST 01/22/21   Nida, Marella Chimes, MD  LUMIGAN 0.01 % SOLN Place 1 drop into the left eye at bedtime. 03/23/20   [provider]  methotrexate (RHEUMATREX) 2.5 MG tablet Take 25 mg by mouth every Wednesday. 10 tabs 03/09/18   [provider]  Multiple Vitamins-Minerals (MULTIVITAMIN WITH MINERALS) tablet Take 1 tablet by mouth daily.    [provider]  potassium chloride SA (KLOR-CON) 20 MEQ tablet TAKE 1 TABLET(20 MEQ) BY MOUTH TWICE DAILY Patient taking differently: Take 20 mEq by mouth daily. TAKE 1 TABLET(20 MEQ) BY MOUTH TWICE DAILY 05/22/20   Susy Frizzle, MD  prednisoLONE acetate (PRED FORTE) 1 % ophthalmic suspension 3 (three) times daily. 09/03/20   [provider]  torsemide (DEMADEX) 20 MG tablet TAKE 1 TO 2 TABLETS BY MOUTH DAILY AS NEEDED FOR SWELLING Patient taking differently: Take 20 mg by mouth daily as needed. 08/31/20   Lindell Spar, MD  verapamil (CALAN-SR) 240 MG CR tablet Take 1 tablet by mouth twice daily 08/31/20   Lindell Spar, MD    Family History Family History  Problem Relation Age of Onset   Arthritis Mother    Heart disease Mother    Hypertension Mother    Arthritis Father    Hyperlipidemia Father    HIV/AIDS Brother     Social History Social History   Tobacco Use   Smoking status: Never   Smokeless tobacco: Never  Vaping Use   Vaping Use: Never used  Substance Use Topics   Alcohol use: Yes    Comment: occasional   Drug use: No     Allergies   Acetazolamide and Dicloxacillin   Review of Systems Review of Systems Pertinent negatives listed in HPI   Physical Exam Triage Vital Signs ED Triage Vitals  Enc Vitals Group     BP 01/19/21 1446 (!) 155/72     Pulse Rate 01/19/21 1446 83     Resp 01/19/21 1446 20     Temp 01/19/21 1446 98.3 F (36.8 C)     Temp src --      SpO2 01/19/21 1446 97 %     Weight --      Height --      Head Circumference --      Peak Flow --      Pain Score 01/19/21 1444 0     Pain Loc --      Pain Edu? --      Excl. in [place]? --    No data found.  Updated Vital Signs BP (!) 155/72    Pulse 83   Temp 98.3 F (36.8 C)   Resp 20   SpO2 97%   Visual Acuity Right Eye Distance:   Left Eye Distance:   Bilateral Distance:    Right Eye Near:   Left Eye Near:    Bilateral Near:     Physical Exam  General appearance: alert, well developed, well nourished, cooperative and in no distress  Head: Normocephalic, without obvious abnormality, atraumatic Respiratory: Respirations even and unlabored, normal respiratory rate Heart: rate and rhythm normal.  Extremities: No gross deformities Skin: Macular rash with fine satellite lesions and excoriation present beneath breast. Skin color, texture, turgor normal. Psych: Appropriate mood and affect. Neurologic: No obvious focal abnormality   UC Treatments / Results  Labs (all labs ordered are listed, but only abnormal results are displayed) Labs Reviewed - No data to display  EKG   Radiology No results found.  Procedures Procedures (including critical care time)  Medications Ordered in UC Medications - No data to display  Initial Impression / Assessment and Plan / UC Course  I have reviewed the triage vital signs and the nursing notes.  Pertinent labs & imaging results that were available during my care of the patient were reviewed by me and considered in my medical decision making (see chart for details).     Interigo, secondary to yeast  Treatment per discharge medication orders. Return precautions given Final Clinical Impressions(s) / UC Diagnoses   Final diagnoses:  Candidiasis, intertrigo     Discharge Instructions      Stop Ketoconazole cream. Start Diflucan one tablet daily for 7 days. Apply nystatin cream to rash up twice daily as needed. Once rash resolves, apply nystatin powder under the breast to prevent recurrent yeast under the breast.      ED Prescriptions     Medication Sig Dispense Auth. Provider   nystatin (MYCOSTATIN/NYSTOP) powder Apply 1 application topically 3 (three) times  daily as needed. 60 g Scot Jun, FNP   fluconazole (DIFLUCAN) 200 MG tablet Take 1 tablet (200 mg total) by mouth daily for 7 days. 7 tablet Scot Jun, FNP   nystatin cream (MYCOSTATIN) Apply to affected area 2 times daily 60 g Scot Jun, FNP      PDMP not reviewed this encounter.   Scot Jun, FNP 01/23/21 2356

## 2021-01-19 NOTE — Discharge Instructions (Addendum)
Stop Ketoconazole cream. Start Diflucan one tablet daily for 7 days. Apply nystatin cream to rash up twice daily as needed. Once rash resolves, apply nystatin powder under the breast to prevent recurrent yeast under the breast.

## 2021-01-19 NOTE — ED Triage Notes (Signed)
Pt presents with rash under breast for past month, was given cream by PCP that hasnt helped

## 2021-01-22 ENCOUNTER — Other Ambulatory Visit: Payer: Self-pay | Admitting: "Endocrinology

## 2021-01-24 ENCOUNTER — Ambulatory Visit (HOSPITAL_COMMUNITY)
Admission: RE | Admit: 2021-01-24 | Discharge: 2021-01-24 | Disposition: A | Discharge status: Home / Self Care | Payer: Medicare Other | Source: Ambulatory Visit | Attending: Nephrology | Admitting: Nephrology

## 2021-01-24 ENCOUNTER — Other Ambulatory Visit: Payer: Self-pay

## 2021-01-24 DIAGNOSIS — N189 Chronic kidney disease, unspecified: Secondary | ICD-10-CM | POA: Diagnosis not present

## 2021-01-24 DIAGNOSIS — E785 Hyperlipidemia, unspecified: Secondary | ICD-10-CM | POA: Diagnosis not present

## 2021-01-24 DIAGNOSIS — I129 Hypertensive chronic kidney disease with stage 1 through stage 4 chronic kidney disease, or unspecified chronic kidney disease: Secondary | ICD-10-CM | POA: Insufficient documentation

## 2021-01-24 DIAGNOSIS — N281 Cyst of kidney, acquired: Secondary | ICD-10-CM | POA: Diagnosis not present

## 2021-01-24 DIAGNOSIS — I1310 Hypertensive heart and chronic kidney disease without heart failure, with stage 1 through stage 4 chronic kidney disease, or unspecified chronic kidney disease: Secondary | ICD-10-CM | POA: Insufficient documentation

## 2021-01-24 DIAGNOSIS — N1832 Chronic kidney disease, stage 3b: Secondary | ICD-10-CM | POA: Insufficient documentation

## 2021-01-24 DIAGNOSIS — E1122 Type 2 diabetes mellitus with diabetic chronic kidney disease: Secondary | ICD-10-CM | POA: Diagnosis not present

## 2021-01-24 DIAGNOSIS — I10 Essential (primary) hypertension: Secondary | ICD-10-CM | POA: Insufficient documentation

## 2021-01-24 DIAGNOSIS — Z79899 Other long term (current) drug therapy: Secondary | ICD-10-CM | POA: Diagnosis not present

## 2021-01-24 LAB — ECHOCARDIOGRAM COMPLETE
AR max vel: 2.25 cm2
AV Area VTI: 2.45 cm2
AV Area mean vel: 2.25 cm2
AV Mean grad: 4 mmHg
AV Peak grad: 7 mmHg
Ao pk vel: 1.32 m/s
Area-P 1/2: 3.91 cm2
Calc EF: 58.9 %
MV VTI: 1.9 cm2
S' Lateral: 3 cm
Single Plane A2C EF: 54.1 %
Single Plane A4C EF: 61.8 %

## 2021-01-30 DIAGNOSIS — H4042X3 Glaucoma secondary to eye inflammation, left eye, severe stage: Secondary | ICD-10-CM | POA: Diagnosis not present

## 2021-02-08 DIAGNOSIS — I129 Hypertensive chronic kidney disease with stage 1 through stage 4 chronic kidney disease, or unspecified chronic kidney disease: Secondary | ICD-10-CM | POA: Diagnosis not present

## 2021-02-08 DIAGNOSIS — R600 Localized edema: Secondary | ICD-10-CM | POA: Diagnosis not present

## 2021-02-08 DIAGNOSIS — I517 Cardiomegaly: Secondary | ICD-10-CM | POA: Diagnosis not present

## 2021-02-08 DIAGNOSIS — E211 Secondary hyperparathyroidism, not elsewhere classified: Secondary | ICD-10-CM | POA: Diagnosis not present

## 2021-02-08 DIAGNOSIS — N1831 Chronic kidney disease, stage 3a: Secondary | ICD-10-CM | POA: Diagnosis not present

## 2021-02-08 DIAGNOSIS — N281 Cyst of kidney, acquired: Secondary | ICD-10-CM | POA: Diagnosis not present

## 2021-02-08 DIAGNOSIS — R894 Abnormal immunological findings in specimens from other organs, systems and tissues: Secondary | ICD-10-CM | POA: Diagnosis not present

## 2021-02-11 IMAGING — US RIGHT LOWER EXTREMITY VENOUS ULTRASOUND
1 series · 13 of 24 positions shown · non-contrast
Comparison: None.

CLINICAL DATA: 67-year-old female with a history right lower
extremity swelling



[Series 1: right lower extremity venous ultrasound · 0.10mm/px · 13 of 52 slices shown]
[im 1/52]
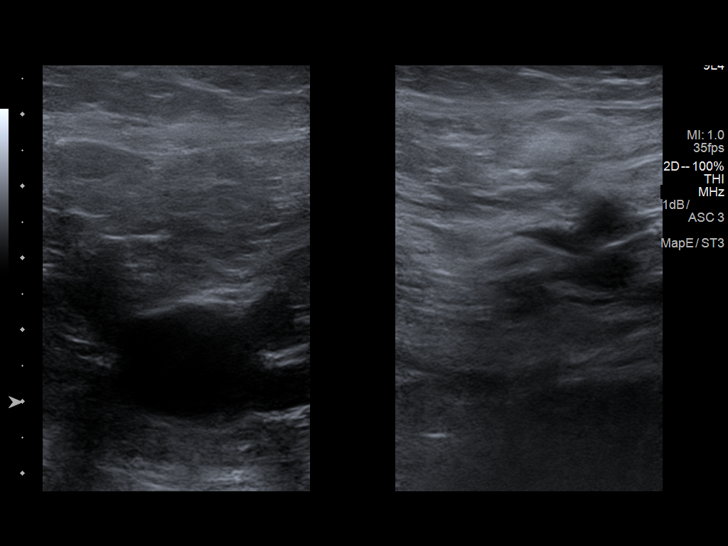
[im 5/52]
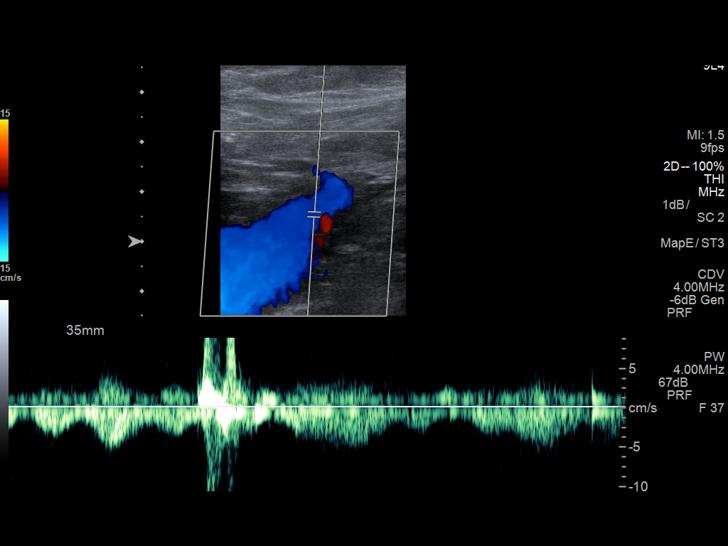
[im 9/52]
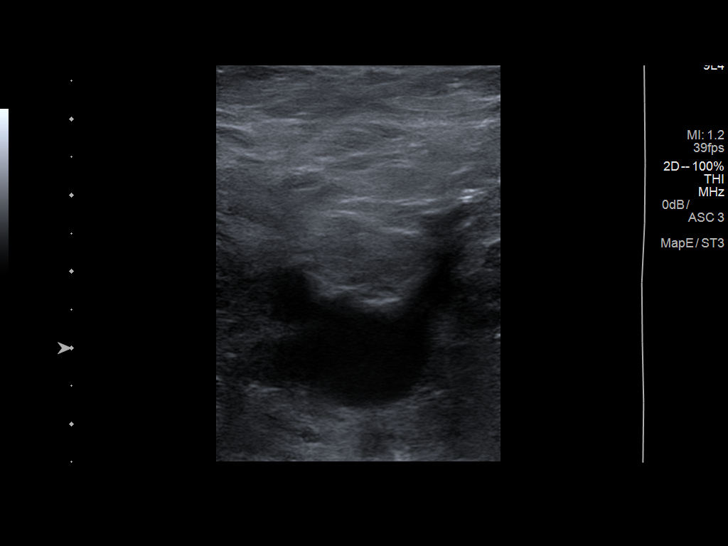
[im 14/52]
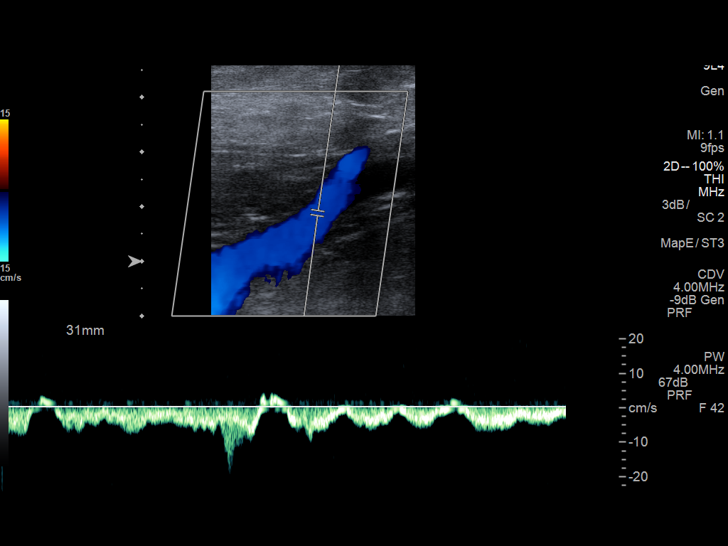
[im 18/52]
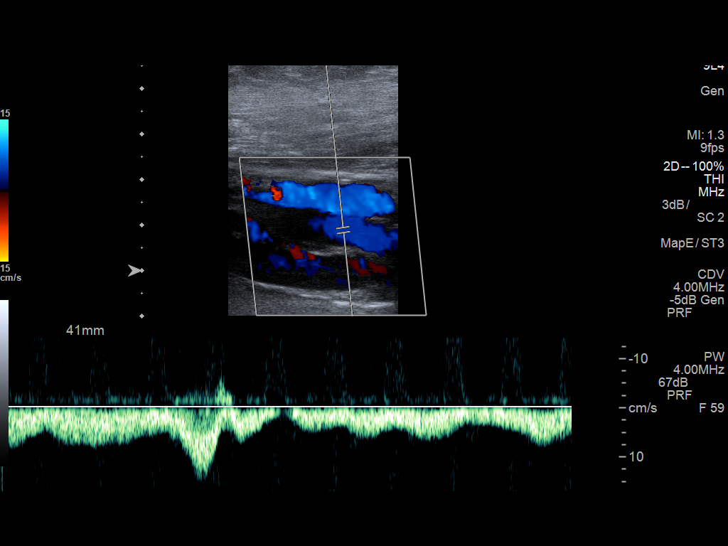
[im 23/52]
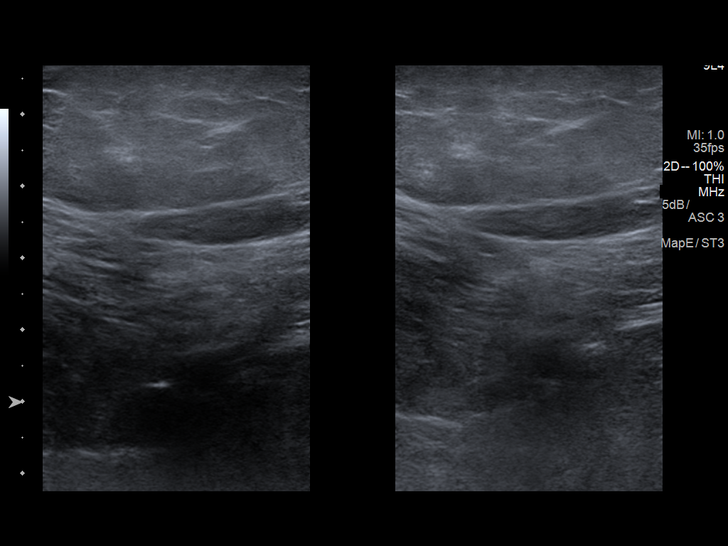
[im 27/52]
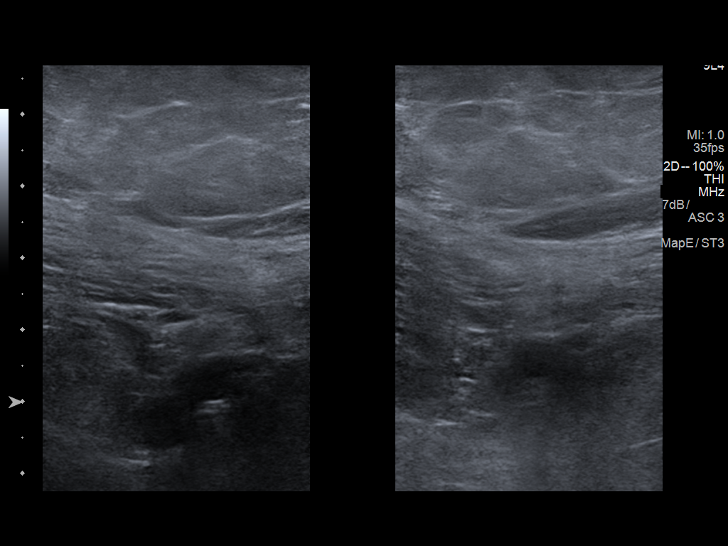
[im 29/52]
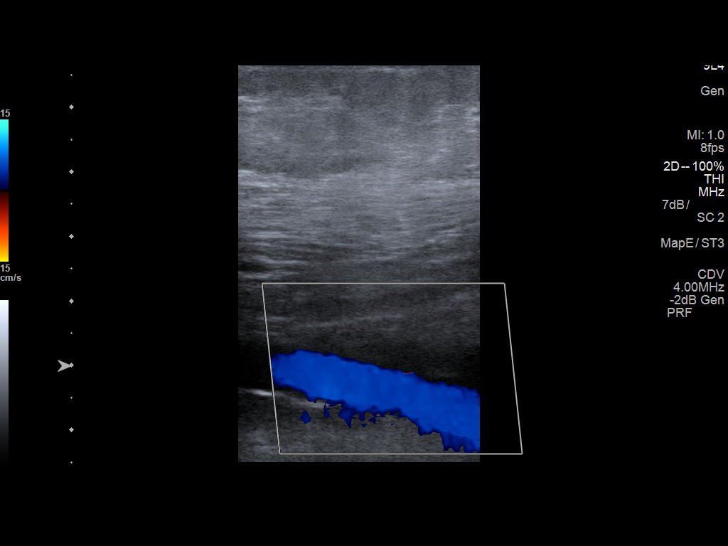
[im 34/52]
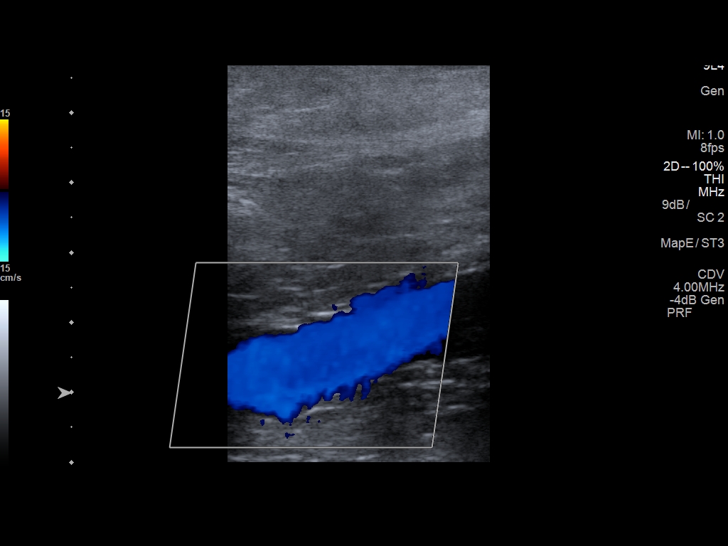
[im 38/52]
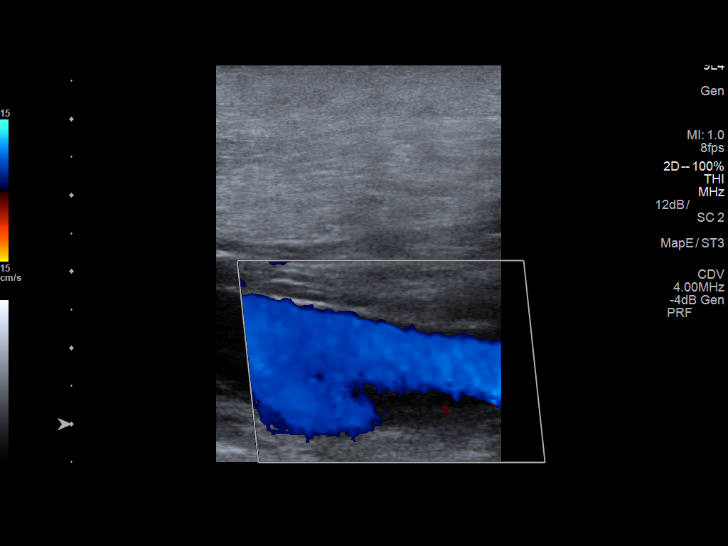
[im 43/52]
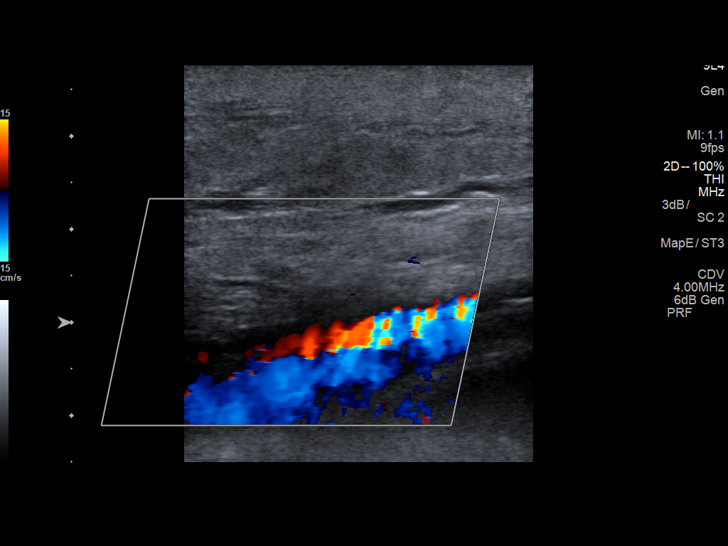
[im 47/52]
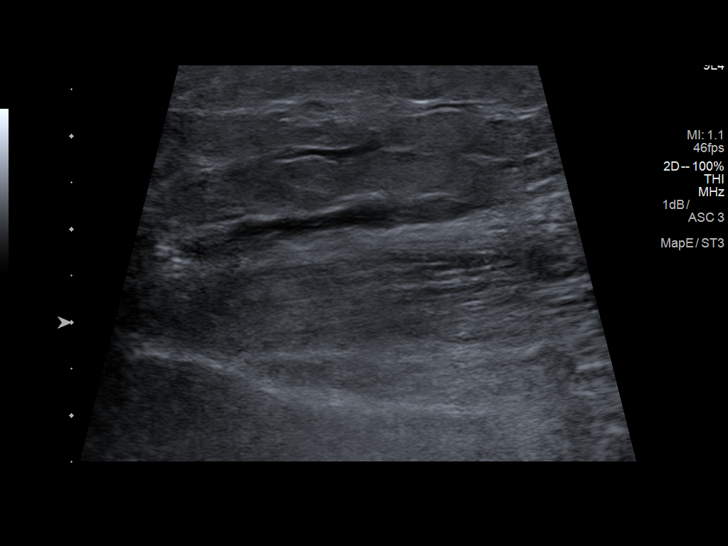
[im 52/52]
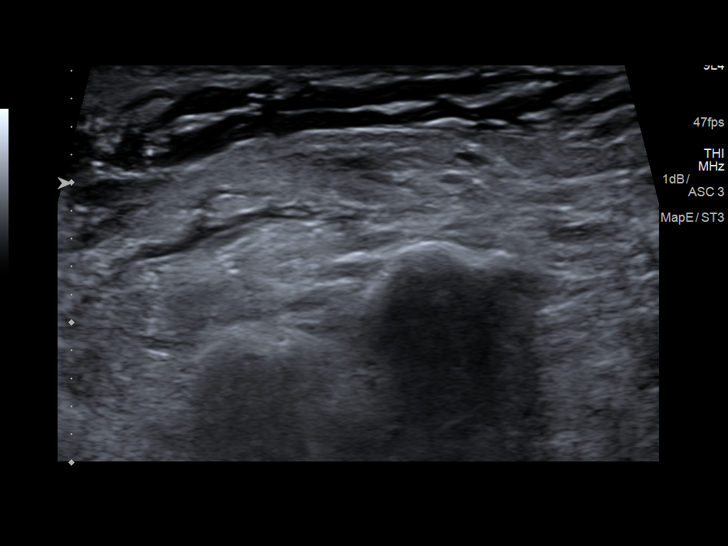

[13 of 24 positions shown; findings below may reference images not displayed]

FINDINGS: Contralateral Common Femoral Vein: Respiratory phasicity is normal
and symmetric with the symptomatic side. No evidence of thrombus.
Normal compressibility.

Common Femoral Vein: No evidence of thrombus. Normal
compressibility, respiratory phasicity and response to augmentation.

Saphenofemoral Junction: No evidence of thrombus. Normal
compressibility and flow on color Doppler imaging.

Profunda Femoral Vein: No evidence of thrombus. Normal
compressibility and flow on color Doppler imaging.

Femoral Vein: No evidence of thrombus. Normal compressibility,
respiratory phasicity and response to augmentation.

Popliteal Vein: No evidence of thrombus. Normal compressibility,
respiratory phasicity and response to augmentation.

Calf Veins: Incomplete visualization of the peroneal vein. Posterior
tibial vein appears patent.

Superficial Great Saphenous Vein: No evidence of thrombus. Normal
compressibility and flow on color Doppler imaging.

Other Findings:  Edema
IMPRESSION: Sonographic survey of the right lower extremity negative for DVT.

Edema

## 2021-02-17 ENCOUNTER — Other Ambulatory Visit: Payer: Self-pay | Admitting: Internal Medicine

## 2021-03-05 DIAGNOSIS — Z79899 Other long term (current) drug therapy: Secondary | ICD-10-CM | POA: Diagnosis not present

## 2021-03-05 DIAGNOSIS — H35033 Hypertensive retinopathy, bilateral: Secondary | ICD-10-CM | POA: Diagnosis not present

## 2021-03-05 DIAGNOSIS — H20042 Secondary noninfectious iridocyclitis, left eye: Secondary | ICD-10-CM | POA: Diagnosis not present

## 2021-03-05 DIAGNOSIS — Z961 Presence of intraocular lens: Secondary | ICD-10-CM | POA: Diagnosis not present

## 2021-03-05 DIAGNOSIS — H35372 Puckering of macula, left eye: Secondary | ICD-10-CM | POA: Diagnosis not present

## 2021-03-05 DIAGNOSIS — T8521XD Breakdown (mechanical) of intraocular lens, subsequent encounter: Secondary | ICD-10-CM | POA: Diagnosis not present

## 2021-03-05 DIAGNOSIS — H2511 Age-related nuclear cataract, right eye: Secondary | ICD-10-CM | POA: Diagnosis not present

## 2021-03-05 DIAGNOSIS — E1136 Type 2 diabetes mellitus with diabetic cataract: Secondary | ICD-10-CM | POA: Diagnosis not present

## 2021-03-13 DIAGNOSIS — M25562 Pain in left knee: Secondary | ICD-10-CM | POA: Diagnosis not present

## 2021-03-13 DIAGNOSIS — M48061 Spinal stenosis, lumbar region without neurogenic claudication: Secondary | ICD-10-CM | POA: Diagnosis not present

## 2021-03-21 DIAGNOSIS — M5442 Lumbago with sciatica, left side: Secondary | ICD-10-CM | POA: Diagnosis not present

## 2021-03-25 DIAGNOSIS — E890 Postprocedural hypothyroidism: Secondary | ICD-10-CM | POA: Diagnosis not present

## 2021-03-26 DIAGNOSIS — R2681 Unsteadiness on feet: Secondary | ICD-10-CM | POA: Diagnosis not present

## 2021-03-26 DIAGNOSIS — R296 Repeated falls: Secondary | ICD-10-CM | POA: Diagnosis not present

## 2021-03-26 DIAGNOSIS — R531 Weakness: Secondary | ICD-10-CM | POA: Diagnosis not present

## 2021-03-26 DIAGNOSIS — R293 Abnormal posture: Secondary | ICD-10-CM | POA: Diagnosis not present

## 2021-03-26 DIAGNOSIS — M5442 Lumbago with sciatica, left side: Secondary | ICD-10-CM | POA: Diagnosis not present

## 2021-03-26 LAB — TSH: TSH: 3.11 u[IU]/mL (ref 0.450–4.500)

## 2021-03-26 LAB — T4, FREE: Free T4: 1.46 ng/dL (ref 0.82–1.77)

## 2021-04-01 DIAGNOSIS — R296 Repeated falls: Secondary | ICD-10-CM | POA: Diagnosis not present

## 2021-04-01 DIAGNOSIS — R531 Weakness: Secondary | ICD-10-CM | POA: Diagnosis not present

## 2021-04-01 DIAGNOSIS — R293 Abnormal posture: Secondary | ICD-10-CM | POA: Diagnosis not present

## 2021-04-01 DIAGNOSIS — M5442 Lumbago with sciatica, left side: Secondary | ICD-10-CM | POA: Diagnosis not present

## 2021-04-01 DIAGNOSIS — R2681 Unsteadiness on feet: Secondary | ICD-10-CM | POA: Diagnosis not present

## 2021-04-02 DIAGNOSIS — R531 Weakness: Secondary | ICD-10-CM | POA: Diagnosis not present

## 2021-04-02 DIAGNOSIS — R2681 Unsteadiness on feet: Secondary | ICD-10-CM | POA: Diagnosis not present

## 2021-04-02 DIAGNOSIS — R293 Abnormal posture: Secondary | ICD-10-CM | POA: Diagnosis not present

## 2021-04-02 DIAGNOSIS — M5442 Lumbago with sciatica, left side: Secondary | ICD-10-CM | POA: Diagnosis not present

## 2021-04-02 DIAGNOSIS — R296 Repeated falls: Secondary | ICD-10-CM | POA: Diagnosis not present

## 2021-04-04 DIAGNOSIS — R531 Weakness: Secondary | ICD-10-CM | POA: Diagnosis not present

## 2021-04-04 DIAGNOSIS — R293 Abnormal posture: Secondary | ICD-10-CM | POA: Diagnosis not present

## 2021-04-04 DIAGNOSIS — M5442 Lumbago with sciatica, left side: Secondary | ICD-10-CM | POA: Diagnosis not present

## 2021-04-04 DIAGNOSIS — R2681 Unsteadiness on feet: Secondary | ICD-10-CM | POA: Diagnosis not present

## 2021-04-04 DIAGNOSIS — R296 Repeated falls: Secondary | ICD-10-CM | POA: Diagnosis not present

## 2021-04-05 ENCOUNTER — Ambulatory Visit: Payer: Medicare Other | Admitting: "Endocrinology

## 2021-04-09 ENCOUNTER — Other Ambulatory Visit: Payer: Self-pay

## 2021-04-09 ENCOUNTER — Encounter: Payer: Self-pay | Admitting: "Endocrinology

## 2021-04-09 ENCOUNTER — Ambulatory Visit (INDEPENDENT_AMBULATORY_CARE_PROVIDER_SITE_OTHER): Payer: Medicare Other | Admitting: "Endocrinology

## 2021-04-09 VITALS — BP 136/74 | HR 80 | Ht 66.0 in | Wt 215.2 lb

## 2021-04-09 DIAGNOSIS — M5442 Lumbago with sciatica, left side: Secondary | ICD-10-CM | POA: Diagnosis not present

## 2021-04-09 DIAGNOSIS — E890 Postprocedural hypothyroidism: Secondary | ICD-10-CM

## 2021-04-09 DIAGNOSIS — R296 Repeated falls: Secondary | ICD-10-CM | POA: Diagnosis not present

## 2021-04-09 DIAGNOSIS — R293 Abnormal posture: Secondary | ICD-10-CM | POA: Diagnosis not present

## 2021-04-09 DIAGNOSIS — R2681 Unsteadiness on feet: Secondary | ICD-10-CM | POA: Diagnosis not present

## 2021-04-09 DIAGNOSIS — R531 Weakness: Secondary | ICD-10-CM | POA: Diagnosis not present

## 2021-04-09 DIAGNOSIS — R7303 Prediabetes: Secondary | ICD-10-CM

## 2021-04-09 NOTE — Progress Notes (Signed)
04/09/2021                       Endocrinology Follow Up Note    Subjective:    Patient ID: Danielle Howard, female    DOB: [birth year], PCP Lindell Spar, MD   Past Medical History:  Diagnosis Date   Arthritis    Complication of anesthesia    nausea   Diabetes mellitus without complication ([place])    Headache    Hypertension    Hypothyroidism    Hypothyroidism    Iritis    Thyroid disease    Uveitis of left eye 12/29/2017   Past Surgical History:  Procedure Laterality Date   ABDOMINAL HYSTERECTOMY     CATARACT EXTRACTION Left 2019   CHOLECYSTECTOMY     02/2012   COLONOSCOPY N/A 05/25/2013   Procedure: COLONOSCOPY;  Surgeon: Rogene Houston, MD;  [facility];  Service: Endoscopy;  Laterality: N/A;  830-moved to 1055 Ann to notify pt   COLONOSCOPY N/A 06/21/2018   Procedure: COLONOSCOPY;  Surgeon: Rogene Houston, MD;  [facility];  Service: Endoscopy;  Laterality: N/A;  1020   EYE SURGERY N/A    Phreesia 07/13/2020   JOINT REPLACEMENT Bilateral    2012,2013-bilateral knees   POLYPECTOMY  06/21/2018   Procedure: POLYPECTOMY;  Surgeon: Rogene Houston, MD;  [facility];  Service: Endoscopy;;   THYROID SURGERY     Social History   Socioeconomic History   Marital status: Single    Spouse name: Not on file   Number of children: Not on file   Years of education: Not on file   Highest education level: Not on file  Occupational History   Not on file  Tobacco Use   Smoking status: Never   Smokeless tobacco: Never  Vaping Use   Vaping Use: Never used  Substance and Sexual Activity   Alcohol use: Yes    Comment: occasional   Drug use: No   Sexual activity: Yes  Other Topics Concern   Not on file  Social History Narrative   Not on file   Social Determinants of Health   Financial Resource Strain: Low Risk    Difficulty of Paying Living Expenses: Not hard at all  Food Insecurity: No Food Insecurity   Worried About Paediatric nurse in the Last Year: Never true   Ran Out of Food in the Last Year: Never true  Transportation Needs: No Transportation Needs   Lack of Transportation (Medical): No   Lack of Transportation (Non-Medical): No  Physical Activity: Inactive   Days of Exercise per Week: 0 days   Minutes of Exercise per Session: 0 min  Stress: No Stress Concern Present   Feeling of Stress : Not at all  Social Connections: Moderately Isolated   Frequency of Communication with Friends and Family: More than three times a week   Frequency of Social Gatherings with Friends and Family: More than three times a week   Attends Religious Services: More than 4 times per year   Active Member of Clubs or Organizations: No   Attends Archivist Meetings: Never   Marital Status: Never married   Outpatient Encounter Medications as of 04/09/2021  Medication Sig   brimonidine (ALPHAGAN) 0.15 % ophthalmic solution Place 1 drop into both eyes 3 (three) times daily.   Calcium Carbonate-Vit D-Min (CALCIUM 1200 PO) Take by mouth.   dorzolamide-timolol (COSOPT)  22.3-6.8 MG/ML ophthalmic solution 2 (two) times daily. Left eye only   folic acid (FOLVITE) 1 MG tablet Take 1 mg by mouth daily.    levothyroxine (SYNTHROID) 150 MCG tablet TAKE 1 TABLET(150 MCG) BY MOUTH DAILY BEFORE AND BREAKFAST   LUMIGAN 0.01 % SOLN Place 1 drop into the left eye at bedtime.   Multiple Vitamins-Minerals (MULTIVITAMIN WITH MINERALS) tablet Take 1 tablet by mouth daily.   nystatin (MYCOSTATIN/NYSTOP) powder Apply 1 application topically 3 (three) times daily as needed.   nystatin cream (MYCOSTATIN) Apply to affected area 2 times daily   potassium chloride SA (KLOR-CON) 20 MEQ tablet TAKE 1 TABLET(20 MEQ) BY MOUTH TWICE DAILY (Patient taking differently: Take 20 mEq by mouth daily. TAKE 1 TABLET(20 MEQ) BY MOUTH DAILY)   torsemide (DEMADEX) 20 MG tablet TAKE 1 TO 2 TABLETS BY MOUTH DAILY AS NEEDED FOR SWELLING (Patient taking differently:  Take 20 mg by mouth daily.)   verapamil (CALAN-SR) 240 MG CR tablet TAKE 1 TABLET BY MOUTH TWICE DAILY   [DISCONTINUED] acyclovir (ZOVIRAX) 800 MG tablet Take by mouth 2 (two) times daily.   [DISCONTINUED] cycloSPORINE modified (NEORAL) 50 MG capsule Take by mouth. Take (2) caps PO Q AM and (1) cap PO Q PM   [DISCONTINUED] methotrexate (RHEUMATREX) 2.5 MG tablet Take 25 mg by mouth every Wednesday. 10 tabs   [DISCONTINUED] prednisoLONE acetate (PRED FORTE) 1 % ophthalmic suspension 3 (three) times daily.   No facility-administered encounter medications on file as of 04/09/2021.   ALLERGIES: Allergies  Allergen Reactions   Acetazolamide    Dicloxacillin Other (See Comments)    GI upset   VACCINATION STATUS: Immunization History  Administered Date(s) Administered   Fluad Quad(high Dose 65+) 01/13/2019, 01/11/2021   Influenza Whole 02/09/2013   Influenza, High Dose Seasonal PF 01/20/2017, 01/27/2018, 01/04/2020   Influenza,inj,Quad PF,6+ Mos 01/15/2015, 01/25/2016   Influenza-Unspecified 01/13/2014   Moderna Sars-Covid-2 Vaccination 06/16/2019, 07/18/2019   Pneumococcal Conjugate-13 01/15/2015   Pneumococcal Polysaccharide-23 01/13/2014   Tdap 10/02/2009   Varicella 03/03/2010    Thyroid Problem Presents for follow-up visit. Symptoms include heat intolerance. Patient reports no anxiety, cold intolerance, constipation, depressed mood, diarrhea, fatigue, hair loss, palpitations, tremors, weight gain or weight loss. The symptoms have been stable.   She is on levothyroxine 150 mcg p.o. daily before breakfast.   She reports improvement in her previous symptoms of fatigue.  She presents with 15 pounds of weight loss, mainly intentional.  She has no new complaints today.    she denies dysphagia, SOB, nor voice change.     Review of systems  Constitutional: + Minimally fluctuating body weight,  current Body mass index is 34.73 kg/m. , no fatigue, + subjective hyperthermia, no  subjective hypothermia    Objective:    BP 136/74   Pulse 80   Ht 5\' 6"  (1.676 m)   Wt 215 lb 3.2 oz (97.6 kg)   BMI 34.73 kg/m   Wt Readings from Last 3 Encounters:  04/09/21 215 lb 3.2 oz (97.6 kg)  12/10/20 214 lb 1.9 oz (97.1 kg)  10/05/20 210 lb (95.3 kg)    BP Readings from Last 3 Encounters:  04/09/21 136/74  01/19/21 (!) 155/72  12/10/20 126/67      Physical Exam- Limited  Constitutional:  Body mass index is 34.73 kg/m. , not in acute distress, normal state of mind     CMP     Component Value Date/Time   NA 144 12/10/2020 1140   K  4.4 12/10/2020 1140   CL 104 12/10/2020 1140   CO2 25 12/10/2020 1140   GLUCOSE 111 (H) 12/10/2020 1140   GLUCOSE 109 (H) 07/05/2020 1928   BUN 32 (H) 12/10/2020 1140   CREATININE 1.33 (H) 12/10/2020 1140   CREATININE 1.28 (H) 02/10/2020 0926   CALCIUM 9.4 12/10/2020 1140   PROT 7.2 12/10/2020 1140   ALBUMIN 4.5 12/10/2020 1140   AST 27 12/10/2020 1140   ALT 17 12/10/2020 1140   ALKPHOS 118 12/10/2020 1140   BILITOT 0.9 12/10/2020 1140   GFRNONAA 23 (L) 07/05/2020 1928   GFRNONAA 41 (L) 03/28/2019 0744   GFRAA 48 (L) 03/28/2019 0744     Diabetic Labs (most recent): Lab Results  Component Value Date   HGBA1C 5.3 02/10/2020   HGBA1C 5.6 08/08/2019   HGBA1C 5.6 03/28/2019     Lipid Panel ( most recent) Lipid Panel     Component Value Date/Time   CHOL 176 02/10/2020 0926   TRIG 106 02/10/2020 0926   HDL 68 02/10/2020 0926   CHOLHDL 2.6 02/10/2020 0926   VLDL 17 07/15/2016 0859   LDLCALC 88 02/10/2020 0926    Results for PUALANI, BORAH (MRN [id number]) as of 10/05/2020 11:06  Ref. Range 08/08/2019 10:09 02/10/2020 09:26 04/02/2020 07:31 09/26/2020 09:57  TSH Latest Ref Range: 0.450 - 4.500 uIU/mL 1.09 4.57 (H) 0.55 0.158 (L)  T4,Free(Direct) Latest Ref Range: 0.82 - 1.77 ng/dL 1.4 1.4 1.4 1.77      Assessment & Plan:   1. 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  She is advised to continue levothyroxine 150 mcg p.o. daily before breakfast  .     - We discussed about the correct intake of her thyroid hormone, on empty stomach at fasting, with water, separated by at least 30 minutes from breakfast and other medications,  and separated by more than 4 hours from calcium, iron, multivitamins, acid reflux medications (PPIs). -Patient is made aware of the fact that thyroid hormone replacement is needed for life, dose to be adjusted by periodic monitoring of thyroid function tests.  For her prediabetes, no medication intervention is necessary at this time.  Her most recent A1c was 5.3%. Her last thyroid u/s from 01/2017 showed normal size right lobe with no nodules.- no need for additional follow up at this time.   - I advised patient to maintain close follow up with Lindell Spar, MD for primary care needs.    I spent 21 minutes in the care of the patient today including review of labs from Thyroid Function, CMP, and other relevant labs ; imaging/biopsy records (current and previous including abstractions from other facilities); face-to-face time discussing  her lab results and symptoms, medications doses, her options of short and long term treatment based on the latest standards of care / guidelines;   and documenting the encounter.  Zadaya Cuadra Lope  participated in the discussions, expressed understanding, and voiced agreement with the above plans.  All questions were answered to her satisfaction. she is encouraged to contact clinic should she have any questions or concerns prior to her return visit.   Follow up plan: Return in about 6 months (around 10/08/2021) for F/U with Pre-visit Labs, A1c -NV.  Rayetta Pigg, [facility] [address] [place], [place] [address] Phone: [phone] Fax: [phone]   04/09/2021, 9:17 PM

## 2021-04-09 NOTE — Patient Instructions (Signed)

## 2021-04-11 DIAGNOSIS — R296 Repeated falls: Secondary | ICD-10-CM | POA: Diagnosis not present

## 2021-04-11 DIAGNOSIS — R531 Weakness: Secondary | ICD-10-CM | POA: Diagnosis not present

## 2021-04-11 DIAGNOSIS — R2681 Unsteadiness on feet: Secondary | ICD-10-CM | POA: Diagnosis not present

## 2021-04-11 DIAGNOSIS — M5442 Lumbago with sciatica, left side: Secondary | ICD-10-CM | POA: Diagnosis not present

## 2021-04-11 DIAGNOSIS — R293 Abnormal posture: Secondary | ICD-10-CM | POA: Diagnosis not present

## 2021-04-12 ENCOUNTER — Encounter: Payer: Self-pay | Admitting: Internal Medicine

## 2021-04-12 ENCOUNTER — Ambulatory Visit (INDEPENDENT_AMBULATORY_CARE_PROVIDER_SITE_OTHER): Payer: Medicare Other | Admitting: Internal Medicine

## 2021-04-12 ENCOUNTER — Other Ambulatory Visit: Payer: Self-pay

## 2021-04-12 VITALS — BP 126/68 | HR 87 | Resp 18 | Ht 66.0 in | Wt 213.0 lb

## 2021-04-12 DIAGNOSIS — I10 Essential (primary) hypertension: Secondary | ICD-10-CM

## 2021-04-12 DIAGNOSIS — E890 Postprocedural hypothyroidism: Secondary | ICD-10-CM | POA: Diagnosis not present

## 2021-04-12 DIAGNOSIS — N1832 Chronic kidney disease, stage 3b: Secondary | ICD-10-CM | POA: Diagnosis not present

## 2021-04-12 NOTE — Assessment & Plan Note (Signed)
On Levothyroxine 150 mcg QD Follows up with Endocrinology - Dr Nida 

## 2021-04-12 NOTE — Progress Notes (Signed)
Established Patient Office Visit  Subjective:  Patient ID: Danielle Howard, female    DOB: 1950-08-27  Age: 70 y.o. MRN: [id number]  CC:  Chief Complaint  Patient presents with   Follow-up    4 month follow up     HPI Danielle Howard is a 70 y.o. female with past medical history of HTN, PVD, hypothyroidism, gluacoma, uveitis and obesity who presents for f/u of her chronic medical conditions.  BP is well-controlled. Takes medications regularly. Patient denies headache, dizziness, chest pain, dyspnea or palpitations.  She has h/o hypothyroidism and takes Levothyroxine 150 mcg QD, except _0 /2013   COLONOSCOPY N/A 05/25/2013   Procedure: COLONOSCOPY;  Surgeon: Rogene Houston, MD;  [facility];  Service: Endoscopy;  Laterality: N/A;  830-moved to 1055 Ann to notify pt   COLONOSCOPY N/A 06/21/2018   Procedure: COLONOSCOPY;  Surgeon: Rogene Houston, MD;  [facility];  Service: Endoscopy;  Laterality: N/A;  1020   EYE SURGERY N/A    Phreesia 07/13/2020   JOINT REPLACEMENT Bilateral    2012,2013-bilateral knees   POLYPECTOMY  06/21/2018   Procedure: POLYPECTOMY;  Surgeon: Rogene Houston, MD;  [facility];  Service: Endoscopy;;   THYROID SURGERY      Family History  Problem Relation Age of Onset    Arthritis Mother    Heart disease Mother    Hypertension Mother    Arthritis Father    Hyperlipidemia Father    HIV/AIDS Brother     Social History   Socioeconomic History   Marital status: Single    Spouse name: Not on file   Number of children: Not on file   Years of education: Not on file   Highest education level: Not on file  Occupational History   Not on file  Tobacco Use   Smoking status: Never   Smokeless tobacco: Never  Vaping Use   Vaping Use: Never used  Substance and Sexual Activity   Alcohol use: Yes    Comment: occasional   Drug use: No   Sexual activity: Yes  Other Topics Concern   Not on file  Social History Narrative   Not on file   Social Determinants of Health   Financial Resource Strain: Low Risk    Difficulty of Paying Living Expenses: Not hard at all  Food Insecurity: No Food Insecurity   Worried About Charity fundraiser in the Last Year: Never true   [place] in the Last Year: Never true  Transportation Needs: No Transportation Needs   Lack of Transportation (Medical): No   Lack of Transportation (Non-Medical): No  Physical Activity: Inactive   Days of  Exercise per Week: 0 days   Minutes of Exercise per Session: 0 min  Stress: No Stress Concern Present   Feeling of Stress : Not at all  Social Connections: Moderately Isolated   Frequency of Communication with Friends and Family: More than three times a week   Frequency of Social Gatherings with Friends and Family: More than three times a week   Attends Religious Services: More than 4 times per year   Active Member of [employer] or Organizations: No   Attends Archivist Meetings: Never   Marital Status: Never married  Human resources officer Violence: Not At Risk   Fear of Current or Ex-Partner: No   Emotionally Abused: No   Physically Abused: No   Sexually Abused: No    Outpatient Medications Prior to Visit  Medication Sig Dispense Refill   brimonidine (ALPHAGAN) 0.15 %  ophthalmic solution Place 1 drop into both eyes 3 (three) times daily. 5 mL 12   Calcium Carbonate-Vit D-Min (CALCIUM 1200 PO) Take by mouth.     dorzolamide-timolol (COSOPT) 22.3-6.8 MG/ML ophthalmic solution 2 (two) times daily. Left eye only     folic acid (FOLVITE) 1 MG tablet Take 1 mg by mouth daily.      levothyroxine (SYNTHROID) 150 MCG tablet TAKE 1 TABLET(150 MCG) BY MOUTH DAILY BEFORE AND BREAKFAST 90 tablet 1   LUMIGAN 0.01 % SOLN Place 1 drop into the left eye at bedtime.     Multiple Vitamins-Minerals (MULTIVITAMIN WITH MINERALS) tablet Take 1 tablet by mouth daily.     potassium chloride SA (KLOR-CON) 20 MEQ tablet TAKE 1 TABLET(20 MEQ) BY MOUTH TWICE DAILY (Patient taking differently: Take 20 mEq by mouth daily. TAKE 1 TABLET(20 MEQ) BY MOUTH DAILY) 180 tablet 3   torsemide (DEMADEX) 20 MG tablet TAKE 1 TO 2 TABLETS BY MOUTH DAILY AS NEEDED FOR SWELLING (Patient taking differently: Take 20 mg by mouth daily.) 180 tablet 2   verapamil (CALAN-SR) 240 MG CR tablet TAKE 1 TABLET BY MOUTH TWICE DAILY 180 tablet 1   nystatin cream (MYCOSTATIN) Apply to affected area 2 times daily (Patient not taking: Reported on 04/12/2021) 60 g 0   nystatin (MYCOSTATIN/NYSTOP) powder Apply 1 application topically 3 (three) times daily as needed. (Patient not taking: Reported on 04/12/2021) 60 g 0   No facility-administered medications prior to visit.    Allergies  Allergen Reactions   Acetazolamide    Dicloxacillin Other (See Comments)    GI upset    ROS Review of Systems  Constitutional:  Negative for chills and fever.  HENT:  Negative for congestion, sinus pressure, sinus pain and sore throat.   Eyes:  Positive for pain. Negative for discharge.  Respiratory:  Negative for cough and shortness of breath.   Cardiovascular:  Positive for leg swelling. Negative for chest pain and palpitations.  Gastrointestinal:  Negative for abdominal pain, constipation, diarrhea, nausea and vomiting.   Endocrine: Negative for polydipsia and polyuria.  Genitourinary:  Negative for dysuria and hematuria.  Musculoskeletal:  Negative for neck pain and neck stiffness.  Skin:  Negative for rash.  Neurological:  Negative for dizziness and weakness.  Psychiatric/Behavioral:  Negative for agitation and behavioral problems.      Objective:    Physical Exam Vitals reviewed.  Constitutional:      General: She is not in acute distress.    Appearance: She is not diaphoretic.  HENT:     Head: Normocephalic and atraumatic.     Nose: Nose normal.  Mouth/Throat:     Mouth: Mucous membranes are moist.  Eyes:     General: No scleral icterus.    Extraocular Movements: Extraocular movements intact.  Cardiovascular:     Rate and Rhythm: Normal rate and regular rhythm.     Pulses: Normal pulses.     Heart sounds: Normal heart sounds. No murmur heard. Pulmonary:     Breath sounds: Normal breath sounds. No wheezing or rales.  Musculoskeletal:     Cervical back: Neck supple. No tenderness.     Right lower leg: Edema (2+) present.     Left lower leg: Edema (2+) present.  Skin:    General: Skin is warm.     Findings: No rash.     Comments: 2 masses noted over right forearm - about 1 cm and 2 cm in diameter, nontender, likely lipoma  Neurological:     General: No focal deficit present.     Mental Status: She is alert and oriented to person, place, and time.  Psychiatric:        Mood and Affect: Mood normal.        Behavior: Behavior normal.    BP 126/68 (BP Location: Left Arm, Patient Position: Sitting, Cuff Size: Normal)   Pulse 87   Resp 18   Ht _0  (1.676 m)   Wt 213 lb 0.6 oz (96.6 kg)   SpO2 97%   BMI 34.39 kg/m  Wt Readings from Last 3 Encounters:  04/12/21 213 lb 0.6 oz (96.6 kg)  04/09/21 215 lb 3.2 oz (97.6 kg)  12/10/20 214 lb 1.9 oz (97.1 kg)    Lab Results  Component Value Date   TSH 3.110 03/25/2021   Lab Results  Component Value Date   WBC 4.3 12/10/2020    HGB 12.5 12/10/2020   HCT 38.3 12/10/2020   MCV 99 (H) 12/10/2020   PLT 310 12/10/2020   Lab Results  Component Value Date   NA 144 12/10/2020   K 4.4 12/10/2020   CO2 25 12/10/2020   GLUCOSE 111 (H) 12/10/2020   BUN 32 (H) 12/10/2020   CREATININE 1.33 (H) 12/10/2020   BILITOT 0.9 12/10/2020   ALKPHOS 118 12/10/2020   AST 27 12/10/2020   ALT 17 12/10/2020   PROT 7.2 12/10/2020   ALBUMIN 4.5 12/10/2020   CALCIUM 9.4 12/10/2020   ANIONGAP 12 07/05/2020   EGFR 43 (L) 12/10/2020   Lab Results  Component Value Date   CHOL 176 02/10/2020   Lab Results  Component Value Date   HDL 68 02/10/2020   Lab Results  Component Value Date   LDLCALC 88 02/10/2020   Lab Results  Component Value Date   TRIG 106 02/10/2020   Lab Results  Component Value Date   CHOLHDL 2.6 02/10/2020   Lab Results  Component Value Date   HGBA1C 5.3 02/10/2020      Assessment & Plan:   Problem List Items Addressed This Visit       Cardiovascular and Mediastinum   Essential hypertension, benign    BP Readings from Last 1 Encounters:  04/12/21 126/68  Well-controlled with Verapamil Followed by Nephrology Counseled for compliance with the medications Advised DASH diet and moderate exercise/walking as tolerated        Endocrine   Hypothyroidism    On Levothyroxine 150 mcg QD Follows up with Endocrinology - Dr Dorris Fetch        Genitourinary   Chronic kidney disease (CKD), stage III (moderate) ([place]) - Primary  Last BMP reviewed She has stopped taking soft drinks cyclosporine and acyclovir discontinued Advised to maintain proper hydration Avoid nephrotoxic agents followed by nephrology       No orders of the defined types were placed in this encounter.   Follow-up: Return in about 6 months (around 10/11/2021) for HTN and CKD.    Lindell Spar, MD

## 2021-04-12 NOTE — Assessment & Plan Note (Signed)
BP Readings from Last 1 Encounters:  04/12/21 126/68   Well-controlled with Verapamil Followed by Nephrology Counseled for compliance with the medications Advised DASH diet and moderate exercise/walking as tolerated

## 2021-04-12 NOTE — Patient Instructions (Signed)
Please continue taking medications as prescribed.  Please continue to follow low salt diet and ambulate as tolerated. 

## 2021-04-12 NOTE — Assessment & Plan Note (Signed)
Last BMP reviewed She has stopped taking soft drinks cyclosporine and acyclovir discontinued Advised to maintain proper hydration Avoid nephrotoxic agents followed by nephrology

## 2021-04-16 DIAGNOSIS — M5442 Lumbago with sciatica, left side: Secondary | ICD-10-CM | POA: Diagnosis not present

## 2021-04-16 DIAGNOSIS — R531 Weakness: Secondary | ICD-10-CM | POA: Diagnosis not present

## 2021-04-16 DIAGNOSIS — R293 Abnormal posture: Secondary | ICD-10-CM | POA: Diagnosis not present

## 2021-04-16 DIAGNOSIS — R296 Repeated falls: Secondary | ICD-10-CM | POA: Diagnosis not present

## 2021-04-16 DIAGNOSIS — R2681 Unsteadiness on feet: Secondary | ICD-10-CM | POA: Diagnosis not present

## 2021-04-17 DIAGNOSIS — R293 Abnormal posture: Secondary | ICD-10-CM | POA: Diagnosis not present

## 2021-04-17 DIAGNOSIS — R2681 Unsteadiness on feet: Secondary | ICD-10-CM | POA: Diagnosis not present

## 2021-04-17 DIAGNOSIS — R296 Repeated falls: Secondary | ICD-10-CM | POA: Diagnosis not present

## 2021-04-17 DIAGNOSIS — M5442 Lumbago with sciatica, left side: Secondary | ICD-10-CM | POA: Diagnosis not present

## 2021-04-17 DIAGNOSIS — R531 Weakness: Secondary | ICD-10-CM | POA: Diagnosis not present

## 2021-04-19 ENCOUNTER — Other Ambulatory Visit: Payer: Self-pay | Admitting: "Endocrinology

## 2021-04-23 DIAGNOSIS — R293 Abnormal posture: Secondary | ICD-10-CM | POA: Diagnosis not present

## 2021-04-23 DIAGNOSIS — R2681 Unsteadiness on feet: Secondary | ICD-10-CM | POA: Diagnosis not present

## 2021-04-23 DIAGNOSIS — R531 Weakness: Secondary | ICD-10-CM | POA: Diagnosis not present

## 2021-04-23 DIAGNOSIS — M5442 Lumbago with sciatica, left side: Secondary | ICD-10-CM | POA: Diagnosis not present

## 2021-04-23 DIAGNOSIS — R296 Repeated falls: Secondary | ICD-10-CM | POA: Diagnosis not present

## 2021-05-03 DIAGNOSIS — M25561 Pain in right knee: Secondary | ICD-10-CM | POA: Diagnosis not present

## 2021-05-04 ENCOUNTER — Other Ambulatory Visit: Payer: Self-pay | Admitting: Family Medicine

## 2021-05-07 DIAGNOSIS — Z961 Presence of intraocular lens: Secondary | ICD-10-CM | POA: Diagnosis not present

## 2021-05-07 DIAGNOSIS — Z79899 Other long term (current) drug therapy: Secondary | ICD-10-CM | POA: Diagnosis not present

## 2021-05-07 DIAGNOSIS — T8521XD Breakdown (mechanical) of intraocular lens, subsequent encounter: Secondary | ICD-10-CM | POA: Diagnosis not present

## 2021-05-07 DIAGNOSIS — E1136 Type 2 diabetes mellitus with diabetic cataract: Secondary | ICD-10-CM | POA: Diagnosis not present

## 2021-05-07 DIAGNOSIS — H35372 Puckering of macula, left eye: Secondary | ICD-10-CM | POA: Diagnosis not present

## 2021-05-07 DIAGNOSIS — H2511 Age-related nuclear cataract, right eye: Secondary | ICD-10-CM | POA: Diagnosis not present

## 2021-05-07 DIAGNOSIS — H20042 Secondary noninfectious iridocyclitis, left eye: Secondary | ICD-10-CM | POA: Diagnosis not present

## 2021-05-07 DIAGNOSIS — H35033 Hypertensive retinopathy, bilateral: Secondary | ICD-10-CM | POA: Diagnosis not present

## 2021-05-14 DIAGNOSIS — I517 Cardiomegaly: Secondary | ICD-10-CM | POA: Diagnosis not present

## 2021-05-14 DIAGNOSIS — E211 Secondary hyperparathyroidism, not elsewhere classified: Secondary | ICD-10-CM | POA: Diagnosis not present

## 2021-05-14 DIAGNOSIS — N281 Cyst of kidney, acquired: Secondary | ICD-10-CM | POA: Diagnosis not present

## 2021-05-14 DIAGNOSIS — N1831 Chronic kidney disease, stage 3a: Secondary | ICD-10-CM | POA: Diagnosis not present

## 2021-05-14 DIAGNOSIS — I129 Hypertensive chronic kidney disease with stage 1 through stage 4 chronic kidney disease, or unspecified chronic kidney disease: Secondary | ICD-10-CM | POA: Diagnosis not present

## 2021-05-14 DIAGNOSIS — R894 Abnormal immunological findings in specimens from other organs, systems and tissues: Secondary | ICD-10-CM | POA: Diagnosis not present

## 2021-05-17 DIAGNOSIS — R809 Proteinuria, unspecified: Secondary | ICD-10-CM | POA: Diagnosis not present

## 2021-05-17 DIAGNOSIS — N1831 Chronic kidney disease, stage 3a: Secondary | ICD-10-CM | POA: Diagnosis not present

## 2021-05-17 DIAGNOSIS — E211 Secondary hyperparathyroidism, not elsewhere classified: Secondary | ICD-10-CM | POA: Diagnosis not present

## 2021-05-17 DIAGNOSIS — I129 Hypertensive chronic kidney disease with stage 1 through stage 4 chronic kidney disease, or unspecified chronic kidney disease: Secondary | ICD-10-CM | POA: Diagnosis not present

## 2021-05-17 DIAGNOSIS — I517 Cardiomegaly: Secondary | ICD-10-CM | POA: Diagnosis not present

## 2021-05-20 DIAGNOSIS — M25561 Pain in right knee: Secondary | ICD-10-CM | POA: Diagnosis not present

## 2021-05-31 DIAGNOSIS — E211 Secondary hyperparathyroidism, not elsewhere classified: Secondary | ICD-10-CM | POA: Diagnosis not present

## 2021-05-31 DIAGNOSIS — R809 Proteinuria, unspecified: Secondary | ICD-10-CM | POA: Diagnosis not present

## 2021-05-31 DIAGNOSIS — I129 Hypertensive chronic kidney disease with stage 1 through stage 4 chronic kidney disease, or unspecified chronic kidney disease: Secondary | ICD-10-CM | POA: Diagnosis not present

## 2021-05-31 DIAGNOSIS — I517 Cardiomegaly: Secondary | ICD-10-CM | POA: Diagnosis not present

## 2021-05-31 DIAGNOSIS — N1831 Chronic kidney disease, stage 3a: Secondary | ICD-10-CM | POA: Diagnosis not present

## 2021-06-18 DIAGNOSIS — H401111 Primary open-angle glaucoma, right eye, mild stage: Secondary | ICD-10-CM | POA: Diagnosis not present

## 2021-06-18 DIAGNOSIS — Z961 Presence of intraocular lens: Secondary | ICD-10-CM | POA: Diagnosis not present

## 2021-06-18 DIAGNOSIS — H52223 Regular astigmatism, bilateral: Secondary | ICD-10-CM | POA: Diagnosis not present

## 2021-06-18 DIAGNOSIS — H401122 Primary open-angle glaucoma, left eye, moderate stage: Secondary | ICD-10-CM | POA: Diagnosis not present

## 2021-06-18 DIAGNOSIS — H5213 Myopia, bilateral: Secondary | ICD-10-CM | POA: Diagnosis not present

## 2021-06-18 DIAGNOSIS — H524 Presbyopia: Secondary | ICD-10-CM | POA: Diagnosis not present

## 2021-06-18 DIAGNOSIS — H2511 Age-related nuclear cataract, right eye: Secondary | ICD-10-CM | POA: Diagnosis not present

## 2021-06-24 DIAGNOSIS — H4042X3 Glaucoma secondary to eye inflammation, left eye, severe stage: Secondary | ICD-10-CM | POA: Diagnosis not present

## 2021-06-28 DIAGNOSIS — Z23 Encounter for immunization: Secondary | ICD-10-CM | POA: Diagnosis not present

## 2021-07-29 ENCOUNTER — Other Ambulatory Visit: Payer: Self-pay | Admitting: *Deleted

## 2021-07-29 MED ORDER — POTASSIUM CHLORIDE CRYS ER 20 MEQ PO TBCR: 20.0000 meq | EXTENDED_RELEASE_TABLET | Freq: Every day | ORAL | 0 refills | Status: DC

## 2021-07-31 DIAGNOSIS — E211 Secondary hyperparathyroidism, not elsewhere classified: Secondary | ICD-10-CM | POA: Diagnosis not present

## 2021-07-31 DIAGNOSIS — R809 Proteinuria, unspecified: Secondary | ICD-10-CM | POA: Diagnosis not present

## 2021-07-31 DIAGNOSIS — N1831 Chronic kidney disease, stage 3a: Secondary | ICD-10-CM | POA: Diagnosis not present

## 2021-07-31 DIAGNOSIS — I129 Hypertensive chronic kidney disease with stage 1 through stage 4 chronic kidney disease, or unspecified chronic kidney disease: Secondary | ICD-10-CM | POA: Diagnosis not present

## 2021-07-31 DIAGNOSIS — I517 Cardiomegaly: Secondary | ICD-10-CM | POA: Diagnosis not present

## 2021-08-07 ENCOUNTER — Ambulatory Visit
Admission: EM | Admit: 2021-08-07 | Discharge: 2021-08-07 | Disposition: A | Discharge status: Home / Self Care | Payer: Medicare Other

## 2021-08-07 ENCOUNTER — Ambulatory Visit (INDEPENDENT_AMBULATORY_CARE_PROVIDER_SITE_OTHER): Payer: Medicare Other

## 2021-08-07 DIAGNOSIS — R051 Acute cough: Secondary | ICD-10-CM

## 2021-08-07 DIAGNOSIS — B349 Viral infection, unspecified: Secondary | ICD-10-CM | POA: Diagnosis not present

## 2021-08-07 DIAGNOSIS — R059 Cough, unspecified: Secondary | ICD-10-CM | POA: Diagnosis not present

## 2021-08-07 DIAGNOSIS — I10 Essential (primary) hypertension: Secondary | ICD-10-CM | POA: Diagnosis not present

## 2021-08-07 DIAGNOSIS — E038 Other specified hypothyroidism: Secondary | ICD-10-CM

## 2021-08-07 DIAGNOSIS — E063 Autoimmune thyroiditis: Secondary | ICD-10-CM

## 2021-08-07 DIAGNOSIS — R0902 Hypoxemia: Secondary | ICD-10-CM | POA: Diagnosis not present

## 2021-08-07 DIAGNOSIS — N183 Chronic kidney disease, stage 3 unspecified: Secondary | ICD-10-CM

## 2021-08-07 MED ORDER — PROMETHAZINE-DM 6.25-15 MG/5ML PO SYRP: 5.0000 mL | ORAL_SOLUTION | Freq: Every evening | ORAL | 0 refills | Status: DC | PRN

## 2021-08-07 MED ORDER — MOLNUPIRAVIR EUA 200MG CAPSULE: 4.0000 | ORAL_CAPSULE | Freq: Two times a day (BID) | ORAL | 0 refills | Status: AC

## 2021-08-07 MED ORDER — BENZONATATE 100 MG PO CAPS
100.0000 mg | ORAL_CAPSULE | Freq: Three times a day (TID) | ORAL | 0 refills | Status: DC | PRN
Start: 2021-08-07 — End: 2021-10-08

## 2021-08-07 NOTE — Discharge Instructions (Signed)
Molnupiravir is for [place]. It is an antiviral that can help prevent complications from COVID. Don't take it until we can tell you results. We will let you know about them in the next 2-3 days. Go ahead and take the cough medications.  ?

## 2021-08-07 NOTE — ED Provider Notes (Signed)
?[facility] ? ? ?MRN: [id number] DOB: 02/13/1951 ? ?Subjective:  ? ?Danielle Howard is a 71 y.o. female presenting for 2 day history of acute onset fatigue, cough, fever, malaise.  Has been using Mucinex and Delsym with some relief.  She is not opposed to COVID-19 testing.  No history of respiratory disorders.  She does have CKD stage III, Hashimoto's thyroiditis, essential hypertension, prediabetes. ? ?No current facility-administered medications for this encounter. ? ?Current Outpatient Medications:  ?  dextromethorphan-guaiFENesin (MUCINEX DM) 30-600 MG 12hr tablet, Take 1 tablet by mouth 2 (two) times daily., Disp: , Rfl:  ?  brimonidine (ALPHAGAN) 0.15 % ophthalmic solution, Place 1 drop into both eyes 3 (three) times daily., Disp: 5 mL, Rfl: 12 ?  Calcium Carbonate-Vit D-Min (CALCIUM 1200 PO), Take by mouth., Disp: , Rfl:  ?  dorzolamide-timolol (COSOPT) 22.3-6.8 MG/ML ophthalmic solution, 2 (two) times daily. Left eye only, Disp: , Rfl:  ?  folic acid (FOLVITE) 1 MG tablet, Take 1 mg by mouth daily. , Disp: , Rfl:  ?  levothyroxine (SYNTHROID) 150 MCG tablet, TAKE 1 TABLET(150 MCG) BY MOUTH DAILY BEFORE BREAKFAST, Disp: 90 tablet, Rfl: 1 ?  LUMIGAN 0.01 % SOLN, Place 1 drop into the left eye at bedtime., Disp: , Rfl:  ?  Multiple Vitamins-Minerals (MULTIVITAMIN WITH MINERALS) tablet, Take 1 tablet by mouth daily., Disp: , Rfl:  ?  nystatin cream (MYCOSTATIN), Apply to affected area 2 times daily (Patient not taking: Reported on 04/12/2021), Disp: 60 g, Rfl: 0 ?  potassium chloride SA (KLOR-CON M) 20 MEQ tablet, Take 1 tablet (20 mEq total) by mouth daily. TAKE 1 TABLET(20 MEQ) BY MOUTH DAILY, Disp: 90 tablet, Rfl: 0 ?  torsemide (DEMADEX) 20 MG tablet, TAKE 1 TO 2 TABLETS BY MOUTH DAILY AS NEEDED FOR SWELLING (Patient taking differently: Take 20 mg by mouth daily.), Disp: 180 tablet, Rfl: 2 ?  verapamil (CALAN-SR) 240 MG CR tablet, TAKE 1 TABLET BY MOUTH TWICE DAILY, Disp: 180 tablet, Rfl: 1   ? ?Allergies  ?Allergen Reactions  ? Acetazolamide   ? Dicloxacillin Other (See Comments)  ?  GI upset  ? ? ?Past Medical History:  ?Diagnosis Date  ? Arthritis   ? Complication of anesthesia   ? nausea  ? Diabetes mellitus without complication ([place])   ? Headache   ? Hypertension   ? Hypothyroidism   ? Hypothyroidism   ? Iritis   ? Thyroid disease   ? Uveitis of left eye 12/29/2017  ?  ? ?Past Surgical History:  ?Procedure Laterality Date  ? ABDOMINAL HYSTERECTOMY    ? CATARACT EXTRACTION Left 2019  ? CHOLECYSTECTOMY    ? 02/2012  ? COLONOSCOPY N/A 05/25/2013  ? Procedure: COLONOSCOPY;  Surgeon: Rogene Houston, MD;  [facility];  Service: Endoscopy;  Laterality: N/A;  830-moved to 1055 Ann to notify pt  ? COLONOSCOPY N/A 06/21/2018  ? Procedure: COLONOSCOPY;  Surgeon: Rogene Houston, MD;  Location: [facility];  Service: Endoscopy;  Laterality: N/A;  1020  ? EYE SURGERY N/A   ? [employer] 07/13/2020  ? JOINT REPLACEMENT Bilateral   ? 2012,2013-bilateral knees  ? POLYPECTOMY  06/21/2018  ? Procedure: POLYPECTOMY;  Surgeon: Rogene Houston, MD;  Location: [facility];  Service: Endoscopy;;  ? THYROID SURGERY    ? ? ?Family History  ?Problem Relation Age of Onset  ? Arthritis Mother   ? Heart disease Mother   ? Hypertension Mother   ?  Arthritis Father   ? Hyperlipidemia Father   ? HIV/AIDS Brother   ? ? ?Social History  ? ?Tobacco Use  ? Smoking status: Never  ? Smokeless tobacco: Never  ?Vaping Use  ? Vaping Use: Never used  ?Substance Use Topics  ? Alcohol use: Yes  ?  Comment: occasional  ? Drug use: No  ? ? ?ROS ? ? ?Objective:  ? ?Vitals: ?BP 126/72 (BP Location: Right Arm)   Pulse 92   Temp 99.6 ?F (37.6 ?C) (Oral)   Resp 20   SpO2 93%  ? ?Pulse oximetry ranged between 91% to 94% and hovered mainly between 92% to 93%.  No respiratory distress.  No use of accessory muscles. ? ?Physical Exam ?Constitutional:   ?   General: She is not in acute distress. ?   Appearance: Normal appearance. She is  well-developed. She is not ill-appearing, toxic-appearing or diaphoretic.  ?HENT:  ?   Head: Normocephalic and atraumatic.  ?   Nose: Nose normal.  ?   Mouth/Throat:  ?   Mouth: Mucous membranes are moist.  ?Eyes:  ?   General: No scleral icterus.    ?   Right eye: No discharge.     ?   Left eye: No discharge.  ?   Extraocular Movements: Extraocular movements intact.  ?Cardiovascular:  ?   Rate and Rhythm: Normal rate.  ?   Heart sounds: No murmur heard. ?  No friction rub. No gallop.  ?Pulmonary:  ?   Effort: Pulmonary effort is normal. No respiratory distress.  ?   Breath sounds: No stridor. No wheezing, rhonchi or rales.  ?   Comments: Slightly decreased lung sounds. ?Chest:  ?   Chest wall: No tenderness.  ?Skin: ?   General: Skin is warm and dry.  ?Neurological:  ?   General: No focal deficit present.  ?   Mental Status: She is alert and oriented to person, place, and time.  ?Psychiatric:     ?   Mood and Affect: Mood normal.     ?   Behavior: Behavior normal.  ? ? ?DG Chest 2 View ? ?Result Date: 08/07/2021 ?CLINICAL DATA:  Cough, hypoxia. EXAM: CHEST - 2 VIEW COMPARISON:  July 05, 2020. FINDINGS: The heart size and mediastinal contours are within normal limits. Right lung is clear. Stable minimal left basilar scarring or atelectasis is noted. The visualized skeletal structures are unremarkable. IMPRESSION: Stable minimal left basilar scarring or atelectasis. Electronically Signed   By: Marijo Conception M.D.   On: 08/07/2021 16:38   ? ?Assessment and Plan :  ? ?PDMP not reviewed this encounter. ? ?1. Acute viral syndrome   ?2. Acute cough   ?3. Stage 3 chronic kidney disease, unspecified whether stage 3a or 3b CKD ([place])   ?4. Essential hypertension   ?5. Hypothyroidism due to Hashimoto's thyroiditis   ? ?High suspicion for COVID-19 given her symptoms, hypoxia.  Recommend supportive care for now.  COVID testing is pending.  I did send a prescription for molnupiravir to her pharmacy electronically in the event that  she turns out to be positive for COVID.  However she does not have delays in her treatment.  I do believe she is a better candidate for this then Paxlovid given her CKD and possible medication interactions. Counseled patient on potential for adverse effects with medications prescribed/recommended today, ER and return-to-clinic precautions discussed, patient verbalized understanding. ? ?  ?Jaynee Eagles, PA-C ?08/07/21 1707 ? ?

## 2021-08-07 NOTE — ED Triage Notes (Signed)
Pt reports fever 101.2 F, cough, fatigue x 2 days. Mucinex and Delsym gives some relief.  ?

## 2021-08-09 LAB — COVID-19, FLU A+B NAA
Influenza A, NAA: NOT DETECTED
Influenza B, NAA: NOT DETECTED
SARS-CoV-2, NAA: NOT DETECTED

## 2021-08-16 ENCOUNTER — Other Ambulatory Visit: Payer: Self-pay | Admitting: Internal Medicine

## 2021-08-24 DIAGNOSIS — N182 Chronic kidney disease, stage 2 (mild): Secondary | ICD-10-CM | POA: Diagnosis not present

## 2021-08-24 DIAGNOSIS — E211 Secondary hyperparathyroidism, not elsewhere classified: Secondary | ICD-10-CM | POA: Diagnosis not present

## 2021-08-24 DIAGNOSIS — R768 Other specified abnormal immunological findings in serum: Secondary | ICD-10-CM | POA: Diagnosis not present

## 2021-08-24 DIAGNOSIS — R809 Proteinuria, unspecified: Secondary | ICD-10-CM | POA: Diagnosis not present

## 2021-09-11 ENCOUNTER — Encounter: Payer: Self-pay | Admitting: Nurse Practitioner

## 2021-09-11 ENCOUNTER — Ambulatory Visit (INDEPENDENT_AMBULATORY_CARE_PROVIDER_SITE_OTHER): Payer: Medicare Other | Admitting: Nurse Practitioner

## 2021-09-11 VITALS — BP 126/71 | HR 81 | Ht 66.0 in | Wt 214.0 lb

## 2021-09-11 DIAGNOSIS — B372 Candidiasis of skin and nail: Secondary | ICD-10-CM

## 2021-09-11 DIAGNOSIS — I10 Essential (primary) hypertension: Secondary | ICD-10-CM | POA: Diagnosis not present

## 2021-09-11 MED ORDER — NYSTATIN 100000 UNIT/GM EX CREA: TOPICAL_CREAM | CUTANEOUS | 0 refills | Status: DC

## 2021-09-11 MED ORDER — FLUCONAZOLE 200 MG PO TABS: 200.0000 mg | ORAL_TABLET | Freq: Every day | ORAL | 0 refills | Status: DC

## 2021-09-11 NOTE — Progress Notes (Addendum)
? ?  Danielle Howard     MRN: [id number]      DOB: [birth year] ? ? ?HPI ?Ms. Lotz with past medical history of hypertension, PVD, hypothyroidism, CKD stage III, prediabetes, obesity is here for c/o itchy rashes under both breast since the past 2 weeks. She had the rashes in 2022, she was treated with nystatin and fluconazole rashes went away and then came back two weeks ago, denies fever, chills , malaise.  She has been using nystatin cream , nystatin powder  but no relief. ? ? ? ? ?ROS ?Denies recent fever or chills. ?Denies sinus pressure, nasal congestion, ear pain or sore throat. ?Denies chest congestion, productive cough or wheezing. ?Denies chest pains, palpitations and leg swelling ?Denies abdominal pain, nausea, vomiting,diarrhea or constipation.   ?Denies dysuria, frequency, hesitancy or incontinence. ?Denies joint pain, swelling and limitation in mobility. ?Denies depression, anxiety or insomnia. ? ? ? ?PE ? ?BP 126/71 (BP Location: Right Arm, Patient Position: Sitting, Cuff Size: Large)   Pulse 81   Ht '5\' 6"'$  (1.676 m)   Wt 214 lb (97.1 kg)   SpO2 98%   BMI 34.54 kg/m?  ? ?Patient alert and oriented and in no cardiopulmonary distress. ? ?Chest: Clear to auscultation bilaterally. ? ?CVS: S1, S2 no murmurs, no S3.Regular rate. ? ?ABD: Soft non tender.  ? ?Ext: No edema ? ?MS: Adequate ROM spine, shoulders, hips and knees. ? ?Skin: skin beneath breast moist and dark  ? ?Psych: Good eye contact, normal affect. Memory intact not anxious or depressed appearing. ? ? ? ?Assessment & Plan ? ?Candidiasis, intertrigo ?She has tried nystatin powder and nystatin cream but no relief. ?Rx fluconazole 200 mg daily for 7 days ?Continue nystatin powder or cream twice daily.  Patient to to use nystatin cream or powder daily as needed once  her skin condition resolves ?Patient told to keep affected area clean and dry to prevent reoccurring candidiasis infection ? ?Essential hypertension, benign ?BP Readings from Last 3  Encounters:  ?09/11/21 126/71  ?08/07/21 126/72  ?04/12/21 126/68  ?Chronic condition well-controlled on verapamil 240 mg daily. ?Continue current medication, DASH diet advised. ?  ?

## 2021-09-11 NOTE — Assessment & Plan Note (Addendum)
She has tried nystatin powder and nystatin cream but no relief. ?Rx fluconazole 200 mg daily for 7 days ?Continue nystatin powder or cream twice daily.  Patient to to use nystatin cream or powder daily as needed once  her skin condition resolves ?Patient told to keep affected area clean and dry to prevent reoccurring candidiasis infection ?

## 2021-09-11 NOTE — Assessment & Plan Note (Addendum)
BP Readings from Last 3 Encounters:  ?09/11/21 126/71  ?08/07/21 126/72  ?04/12/21 126/68  ?Chronic condition well-controlled on verapamil 240 mg daily. ?Continue current medication, DASH diet advised. ? ?

## 2021-09-11 NOTE — Patient Instructions (Addendum)
Please take fluconazole 200 mg. Take 1 tablet (200 mg total) by mouth daily, continue nystatin cream or powder twice daily as needed. Keep skin clean and dry always.  ? ? ?It is important that you exercise regularly at least 30 minutes 5 times a week.  ?Think about what you will eat, plan ahead. ?Choose " clean, green, fresh or frozen" over canned, processed or packaged foods which are more sugary, salty and fatty. ?70 to 75% of food eaten should be vegetables and fruit. ?Three meals at set times with snacks allowed between meals, but they must be fruit or vegetables. ?Aim to eat over a 12 hour period , example 7 am to 7 pm, and STOP after  your last meal of the day. ?Drink water,generally about 64 ounces per day, no other drink is as healthy. Fruit juice is best enjoyed in a healthy way, by EATING the fruit. ? ?Thanks for choosing [facility], we consider it a privelige to serve you ?

## 2021-09-12 ENCOUNTER — Ambulatory Visit: Payer: Medicare Other | Admitting: Family Medicine

## 2021-09-17 DIAGNOSIS — H35372 Puckering of macula, left eye: Secondary | ICD-10-CM | POA: Diagnosis not present

## 2021-09-17 DIAGNOSIS — H35033 Hypertensive retinopathy, bilateral: Secondary | ICD-10-CM | POA: Diagnosis not present

## 2021-09-17 DIAGNOSIS — E119 Type 2 diabetes mellitus without complications: Secondary | ICD-10-CM | POA: Diagnosis not present

## 2021-09-17 DIAGNOSIS — H20042 Secondary noninfectious iridocyclitis, left eye: Secondary | ICD-10-CM | POA: Diagnosis not present

## 2021-09-17 DIAGNOSIS — T8521XD Breakdown (mechanical) of intraocular lens, subsequent encounter: Secondary | ICD-10-CM | POA: Diagnosis not present

## 2021-09-17 DIAGNOSIS — H2511 Age-related nuclear cataract, right eye: Secondary | ICD-10-CM | POA: Diagnosis not present

## 2021-09-17 DIAGNOSIS — Z79899 Other long term (current) drug therapy: Secondary | ICD-10-CM | POA: Diagnosis not present

## 2021-09-17 DIAGNOSIS — Z961 Presence of intraocular lens: Secondary | ICD-10-CM | POA: Diagnosis not present

## 2021-10-02 ENCOUNTER — Other Ambulatory Visit (HOSPITAL_COMMUNITY): Payer: Self-pay | Admitting: Internal Medicine

## 2021-10-02 DIAGNOSIS — E890 Postprocedural hypothyroidism: Secondary | ICD-10-CM | POA: Diagnosis not present

## 2021-10-02 DIAGNOSIS — Z1231 Encounter for screening mammogram for malignant neoplasm of breast: Secondary | ICD-10-CM

## 2021-10-04 LAB — T4, FREE: Free T4: 1.6 ng/dL (ref 0.82–1.77)

## 2021-10-04 LAB — TSH: TSH: 3.84 u[IU]/mL (ref 0.450–4.500)

## 2021-10-08 ENCOUNTER — Ambulatory Visit (INDEPENDENT_AMBULATORY_CARE_PROVIDER_SITE_OTHER): Payer: Medicare Other | Admitting: *Deleted

## 2021-10-08 ENCOUNTER — Ambulatory Visit (INDEPENDENT_AMBULATORY_CARE_PROVIDER_SITE_OTHER): Payer: Medicare Other | Admitting: "Endocrinology

## 2021-10-08 ENCOUNTER — Encounter: Payer: Self-pay | Admitting: "Endocrinology

## 2021-10-08 VITALS — BP 100/56 | HR 60 | Ht 66.0 in | Wt 215.2 lb

## 2021-10-08 DIAGNOSIS — E890 Postprocedural hypothyroidism: Secondary | ICD-10-CM

## 2021-10-08 DIAGNOSIS — R7303 Prediabetes: Secondary | ICD-10-CM

## 2021-10-08 DIAGNOSIS — Z0000 Encounter for general adult medical examination without abnormal findings: Secondary | ICD-10-CM | POA: Diagnosis not present

## 2021-10-08 DIAGNOSIS — E782 Mixed hyperlipidemia: Secondary | ICD-10-CM | POA: Diagnosis not present

## 2021-10-08 LAB — POCT GLYCOSYLATED HEMOGLOBIN (HGB A1C): HbA1c, POC (controlled diabetic range): 5.6 % (ref 0.0–7.0)

## 2021-10-08 NOTE — Patient Instructions (Signed)

## 2021-10-08 NOTE — Patient Instructions (Signed)
Danielle Howard , Thank you for taking time to come for your Medicare Wellness Visit. I appreciate your ongoing commitment to your health goals. Please review the following plan we discussed and let me know if I can assist you in the future.   Screening recommendations/referrals: Colonoscopy: Completed Due 06-22-23 Mammogram: Completed Due 11-09-21 Recommended yearly ophthalmology/optometry visit for glaucoma screening and checkup Recommended yearly dental visit for hygiene and checkup  Vaccinations: Influenza vaccine: Completed Pneumococcal vaccine: Completed  Tdap vaccine: Due now  Shingles vaccine: Completed     Advanced directives: Information provided   Conditions/risks identified: Hypertension  Next appointment: 1 year    Preventive Care 33 Years and Older, Female Preventive care refers to lifestyle choices and visits with your health care provider that can promote health and wellness. What does preventive care include? A yearly physical exam. This is also called an annual well check. Dental exams once or twice a year. Routine eye exams. Ask your health care provider how often you should have your eyes checked. Personal lifestyle choices, including: Daily care of your teeth and gums. Regular physical activity. Eating a healthy diet. Avoiding tobacco and drug use. Limiting alcohol use. Practicing safe sex. Taking low-dose aspirin every day. Taking vitamin and mineral supplements as recommended by your health care provider. What happens during an annual well check? The services and screenings done by your health care provider during your annual well check will depend on your age, overall health, lifestyle risk factors, and family history of disease. Counseling  Your health care provider may ask you questions about your: Alcohol use. Tobacco use. Drug use. Emotional well-being. Home and relationship well-being. Sexual activity. Eating habits. History of falls. Memory and  ability to understand (cognition). Work and work Statistician. Reproductive health. Screening  You may have the following tests or measurements: Height, weight, and BMI. Blood pressure. Lipid and cholesterol levels. These may be checked every 5 years, or more frequently if you are over 81 years old. Skin check. Lung cancer screening. You may have this screening every year starting at age 83 if you have a 30-pack-year history of smoking and currently smoke or have quit within the past 15 years. Fecal occult blood test (FOBT) of the stool. You may have this test every year starting at age 38. Flexible sigmoidoscopy or colonoscopy. You may have a sigmoidoscopy every 5 years or a colonoscopy every 10 years starting at age 1. Hepatitis C blood test. Hepatitis B blood test. Sexually transmitted disease (STD) testing. Diabetes screening. This is done by checking your blood sugar (glucose) after you have not eaten for a while (fasting). You may have this done every 1-3 years. Bone density scan. This is done to screen for osteoporosis. You may have this done starting at age 62. Mammogram. This may be done every 1-2 years. Talk to your health care provider about how often you should have regular mammograms. Talk with your health care provider about your test results, treatment options, and if necessary, the need for more tests. Vaccines  Your health care provider may recommend certain vaccines, such as: Influenza vaccine. This is recommended every year. Tetanus, diphtheria, and acellular pertussis (Tdap, Td) vaccine. You may need a Td booster every 10 years. Zoster vaccine. You may need this after age 20. Pneumococcal 13-valent conjugate (PCV13) vaccine. One dose is recommended after age 101. Pneumococcal polysaccharide (PPSV23) vaccine. One dose is recommended after age 76. Talk to your health care provider about which screenings and vaccines you need and  how often you need them. This information is  not intended to replace advice given to you by your health care provider. Make sure you discuss any questions you have with your health care provider. Document Released: 05/18/2015 Document Revised: 01/09/2016 Document Reviewed: 02/20/2015 Elsevier Interactive Patient Education  2017 [place] Prevention in the Home Falls can cause injuries. They can happen to people of all ages. There are many things you can do to make your home safe and to help prevent falls. What can I do on the outside of my home? Regularly fix the edges of walkways and driveways and fix any cracks. Remove anything that might make you trip as you walk through a door, such as a raised step or threshold. Trim any bushes or trees on the path to your home. Use bright outdoor lighting. Clear any walking paths of anything that might make someone trip, such as rocks or tools. Regularly check to see if handrails are loose or broken. Make sure that both sides of any steps have handrails. Any raised decks and porches should have guardrails on the edges. Have any leaves, snow, or ice cleared regularly. Use sand or salt on walking paths during winter. Clean up any spills in your garage right away. This includes oil or grease spills. What can I do in the bathroom? Use night lights. Install grab bars by the toilet and in the tub and shower. Do not use towel bars as grab bars. Use non-skid mats or decals in the tub or shower. If you need to sit down in the shower, use a plastic, non-slip stool. Keep the floor dry. Clean up any water that spills on the floor as soon as it happens. Remove soap buildup in the tub or shower regularly. Attach bath mats securely with double-sided non-slip rug tape. Do not have throw rugs and other things on the floor that can make you trip. What can I do in the bedroom? Use night lights. Make sure that you have a light by your bed that is easy to reach. Do not use any sheets or blankets that  are too big for your bed. They should not hang down onto the floor. Have a firm chair that has side arms. You can use this for support while you get dressed. Do not have throw rugs and other things on the floor that can make you trip. What can I do in the kitchen? Clean up any spills right away. Avoid walking on wet floors. Keep items that you use a lot in easy-to-reach places. If you need to reach something above you, use a strong step stool that has a grab bar. Keep electrical cords out of the way. Do not use floor polish or wax that makes floors slippery. If you must use wax, use non-skid floor wax. Do not have throw rugs and other things on the floor that can make you trip. What can I do with my stairs? Do not leave any items on the stairs. Make sure that there are handrails on both sides of the stairs and use them. Fix handrails that are broken or loose. Make sure that handrails are as long as the stairways. Check any carpeting to make sure that it is firmly attached to the stairs. Fix any carpet that is loose or worn. Avoid having throw rugs at the top or bottom of the stairs. If you do have throw rugs, attach them to the floor with carpet tape. Make sure that you have  a light switch at the top of the stairs and the bottom of the stairs. If you do not have them, ask someone to add them for you. What else can I do to help prevent falls? Wear shoes that: Do not have high heels. Have rubber bottoms. Are comfortable and fit you well. Are closed at the toe. Do not wear sandals. If you use a stepladder: Make sure that it is fully opened. Do not climb a closed stepladder. Make sure that both sides of the stepladder are locked into place. Ask someone to hold it for you, if possible. Clearly mark and make sure that you can see: Any grab bars or handrails. First and last steps. Where the edge of each step is. Use tools that help you move around (mobility aids) if they are needed. These  include: Canes. Walkers. Scooters. Crutches. Turn on the lights when you go into a dark area. Replace any light bulbs as soon as they burn out. Set up your furniture so you have a clear path. Avoid moving your furniture around. If any of your floors are uneven, fix them. If there are any pets around you, be aware of where they are. Review your medicines with your doctor. Some medicines can make you feel dizzy. This can increase your chance of falling. Ask your doctor what other things that you can do to help prevent falls. This information is not intended to replace advice given to you by your health care provider. Make sure you discuss any questions you have with your health care provider. Document Released: 02/15/2009 Document Revised: 09/27/2015 Document Reviewed: 05/26/2014 Elsevier Interactive Patient Education  2017 [employer].

## 2021-10-08 NOTE — Progress Notes (Signed)
Subjective:   Danielle Howard is a 71 y.o. female who presents for Medicare Annual (Subsequent) preventive examination.  I connected with  Danielle Howard on 10/08/21 by a audio enabled telemedicine application and verified that I am speaking with the correct person using two identifiers.  Patient Location: Home  Provider Location: Home Office  I discussed the limitations of evaluation and management by telemedicine. The patient expressed understanding and agreed to proceed.   Review of Systems     Danielle Howard , Thank you for taking time to come for your Medicare Wellness Visit. I appreciate your ongoing commitment to your health goals. Please review the following plan we discussed and let me know if I can assist you in the future.   These are the goals we discussed:  Goals      DIET - REDUCE FAT INTAKE     Patient Stated     Would like to travel      Weight (lb) < 200 lb (90.7 kg)        This is a list of the screening recommended for you and due dates:  Health Maintenance  Topic Date Due   Tetanus Vaccine  10/03/2019   COVID-19 Vaccine (3 - Moderna risk series) 01/08/2021   Urine Protein Check  02/09/2021   Flu Shot  12/03/2021   Mammogram  11/10/2022   Colon Cancer Screening  06/22/2023   Pneumonia Vaccine  Completed   DEXA scan (bone density measurement)  Completed   Hepatitis C Screening: USPSTF Recommendation to screen - Ages 23-79 yo.  Completed   Zoster (Shingles) Vaccine  Completed   HPV Vaccine  Aged Out   . Cardiac Risk Factors include: advanced age (>58mn, >>15women);hypertension;obesity (BMI >30kg/m2);sedentary lifestyle     Objective:    Today's Vitals   10/08/21 1028  PainSc: 0-No pain   There is no height or weight on file to calculate BMI.     10/08/2021   10:33 AM 10/03/2020    9:49 AM 07/05/2020    6:05 PM 08/08/2019    9:30 AM 06/21/2018    9:16 AM 04/07/2018    9:41 AM 01/29/2017    9:42 AM  Advanced Directives  Does Patient Have a Medical  Advance Directive? No No No No No No No  Would patient like information on creating a medical advance directive? Yes (MAU/Ambulatory/Procedural Areas - Information given) No - Patient declined No - Patient declined No - Patient declined No - Patient declined No - Patient declined     Current Medications (verified) Outpatient Encounter Medications as of 10/08/2021  Medication Sig   brimonidine (ALPHAGAN) 0.15 % ophthalmic solution Place 1 drop into both eyes 3 (three) times daily.   Calcium Carbonate-Vit D-Min (CALCIUM 1200 PO) Take by mouth.   dextromethorphan-guaiFENesin (MUCINEX DM) 30-600 MG 12hr tablet Take 1 tablet by mouth 2 (two) times daily.   dorzolamide-timolol (COSOPT) 22.3-6.8 MG/ML ophthalmic solution 2 (two) times daily. Left eye only   folic acid (FOLVITE) 1 MG tablet Take 1 mg by mouth daily.    levothyroxine (SYNTHROID) 150 MCG tablet TAKE 1 TABLET(150 MCG) BY MOUTH DAILY BEFORE BREAKFAST   LUMIGAN 0.01 % SOLN Place 1 drop into the left eye at bedtime.   Multiple Vitamins-Minerals (MULTIVITAMIN WITH MINERALS) tablet Take 1 tablet by mouth daily.   nystatin cream (MYCOSTATIN) Apply to affected area 2 times daily   potassium chloride SA (KLOR-CON M) 20 MEQ tablet Take 1 tablet (20 mEq total)  by mouth daily. TAKE 1 TABLET(20 MEQ) BY MOUTH DAILY   torsemide (DEMADEX) 20 MG tablet TAKE 1 TO 2 TABLETS BY MOUTH DAILY AS NEEDED FOR SWELLING (Patient taking differently: Take 20 mg by mouth daily.)   verapamil (CALAN-SR) 240 MG CR tablet TAKE 1 TABLET BY MOUTH TWICE DAILY   benzonatate (TESSALON) 100 MG capsule Take 1-2 capsules (100-200 mg total) by mouth 3 (three) times daily as needed for cough. (Patient not taking: Reported on 10/08/2021)   [DISCONTINUED] fluconazole (DIFLUCAN) 200 MG tablet Take 1 tablet (200 mg total) by mouth daily. (Patient not taking: Reported on 10/08/2021)   [DISCONTINUED] promethazine-dextromethorphan (PROMETHAZINE-DM) 6.25-15 MG/5ML syrup Take 5 mLs by mouth at  bedtime as needed for cough. (Patient not taking: Reported on 10/08/2021)   No facility-administered encounter medications on file as of 10/08/2021.    Allergies (verified) Acetazolamide and Dicloxacillin   History: Past Medical History:  Diagnosis Date   Arthritis    Complication of anesthesia    nausea   Diabetes mellitus without complication ([place])    Headache    Hypertension    Hypothyroidism    Hypothyroidism    Iritis    Thyroid disease    Uveitis of left eye 12/29/2017   Past Surgical History:  Procedure Laterality Date   ABDOMINAL HYSTERECTOMY     CATARACT EXTRACTION Left 2019   CHOLECYSTECTOMY     02/2012   COLONOSCOPY N/A 05/25/2013   Procedure: COLONOSCOPY;  Surgeon: Rogene Houston, MD;  Location: [facility];  Service: Endoscopy;  Laterality: N/A;  830-moved to 1055 Ann to notify pt   COLONOSCOPY N/A 06/21/2018   Procedure: COLONOSCOPY;  Surgeon: Rogene Houston, MD;  Location: [facility];  Service: Endoscopy;  Laterality: N/A;  1020   EYE SURGERY N/A    [employer] 07/13/2020   JOINT REPLACEMENT Bilateral    2012,2013-bilateral knees   POLYPECTOMY  06/21/2018   Procedure: POLYPECTOMY;  Surgeon: Rogene Houston, MD;  Location: [facility];  Service: Endoscopy;;   THYROID SURGERY     Family History  Problem Relation Age of Onset   Arthritis Mother    Heart disease Mother    Hypertension Mother    Arthritis Father    Hyperlipidemia Father    HIV/AIDS Brother    Social History   Socioeconomic History   Marital status: Single    Spouse name: Not on file   Number of children: Not on file   Years of education: Not on file   Highest education level: Not on file  Occupational History   Not on file  Tobacco Use   Smoking status: Never   Smokeless tobacco: Never  Vaping Use   Vaping Use: Never used  Substance and Sexual Activity   Alcohol use: Yes    Comment: occasional   Drug use: No   Sexual activity: Yes  Other Topics Concern   Not on file   Social History Narrative   Not on file   Social Determinants of Health   Financial Resource Strain: Low Risk    Difficulty of Paying Living Expenses: Not hard at all  Food Insecurity: No Food Insecurity   Worried About Charity fundraiser in the Last Year: Never true   [place] in the Last Year: Never true  Transportation Needs: No Transportation Needs   Lack of Transportation (Medical): No   Lack of Transportation (Non-Medical): No  Physical Activity: Insufficiently Active   Days of Exercise per  Week: 3 days   Minutes of Exercise per Session: 30 min  Stress: No Stress Concern Present   Feeling of Stress : Not at all  Social Connections: Moderately Isolated   Frequency of Communication with Friends and Family: More than three times a week   Frequency of Social Gatherings with Friends and Family: More than three times a week   Attends Religious Services: More than 4 times per year   Active Member of [employer] or Organizations: No   Attends Music therapist: Never   Marital Status: Never married    Tobacco Counseling Counseling given: Not Answered   Clinical Intake:  Pre-visit preparation completed: Yes  Pain : No/denies pain Pain Score: 0-No pain     BMI - recorded: 34.56 Nutritional Status: BMI > 30  Obese Nutritional Risks: None Diabetes: No  How often do you need to have someone help you when you read instructions, pamphlets, or other written materials from your doctor or pharmacy?: 1 - Never What is the last grade level you completed in school?: Hana  Interpreter Needed?: No      Activities of Daily Living    10/08/2021   10:34 AM 10/04/2021   10:11 AM  In your present state of health, do you have any difficulty performing the following activities:  Hearing? 0 0  Vision? 0 0  Difficulty concentrating or making decisions? 0 0  Walking or climbing stairs? 0 0  Dressing or bathing? 0 0  Doing errands, shopping? 0 0   Preparing Food and eating ? N N  Using the Toilet? N N  In the past six months, have you accidently leaked urine? N N  Do you have problems with loss of bowel control? N N  Managing your Medications? N N  Managing your Finances? N N  Housekeeping or managing your Housekeeping? N N    Patient Care Team: Lindell Spar, MD as PCP - General (Internal Medicine) Harl Bowie Alphonse Guild, MD as PCP - Cardiology (Cardiology)  Indicate any recent Medical Services you may have received from other than [facility] providers in the past year (date may be approximate).     Assessment:   This is a routine wellness examination for Yarelin.  Hearing/Vision screen No results found.  Dietary issues and exercise activities discussed: Current Exercise Habits: The patient does not participate in regular exercise at present, Exercise limited by: None identified   Goals Addressed             This Visit's Progress    COMPLETED: DIET - EAT MORE FRUITS AND VEGETABLES       Patient Stated       Would like to travel       Depression Screen    10/08/2021   10:34 AM 10/08/2021   10:31 AM 09/11/2021   11:23 AM 04/12/2021   11:31 AM 12/10/2020   10:32 AM 10/03/2020    9:54 AM 07/16/2020    8:09 AM  PHQ 2/9 Scores  PHQ - 2 Score 0 0 0 0 0 0 0    Fall Risk    10/08/2021   10:34 AM 10/04/2021   10:11 AM 09/11/2021   11:22 AM 04/12/2021   11:31 AM 12/10/2020   10:32 AM  Fall Risk   Falls in the past year? 0 0 0 0 1  Number falls in past yr: 0 0 0 0 1  Injury with Fall? 0 0 0 0 1  Risk for  fall due to : No Fall Risks  No Fall Risks No Fall Risks History of fall(s);Impaired balance/gait;Impaired mobility  Follow up Falls evaluation completed  Falls evaluation completed Falls evaluation completed Falls evaluation completed;Education provided;Falls prevention discussed;Follow up appointment    FALL RISK PREVENTION PERTAINING TO THE HOME:  Any stairs in or around the home? No  If so, are there any without handrails?  No  Home free of loose throw rugs in walkways, pet beds, electrical cords, etc? Yes  Adequate lighting in your home to reduce risk of falls? Yes   ASSISTIVE DEVICES UTILIZED TO PREVENT FALLS:  Life alert? No  Use of a cane, walker or w/c? Yes  Grab bars in the bathroom? No  Shower chair or bench in shower? yes Elevated toilet seat or a handicapped toilet? Yes    Cognitive Function:    10/08/2021   10:34 AM  MMSE - Mini Mental State Exam  Not completed: Unable to complete        10/08/2021   10:34 AM  6CIT Screen  What Year? 0 points  What month? 0 points  What time? 0 points  Count back from 20 0 points  Months in reverse 0 points  Repeat phrase 0 points  Total Score 0 points    Immunizations Immunization History  Administered Date(s) Administered   Fluad Quad(high Dose 65+) 01/13/2019, 01/11/2021   Influenza Whole 02/09/2013   Influenza, High Dose Seasonal PF 01/20/2017, 01/27/2018, 01/04/2020   Influenza,inj,Quad PF,6+ Mos 01/15/2015, 01/25/2016   Influenza-Unspecified 01/13/2014   Moderna SARS-COV2 Booster Vaccination 03/21/2020, 12/11/2020   Moderna Sars-Covid-2 Vaccination 06/16/2019, 07/18/2019   Pneumococcal Conjugate-13 01/15/2015   Pneumococcal Polysaccharide-23 01/13/2014   Tdap 10/02/2009   Varicella 03/03/2010   Zoster Recombinat (Shingrix) 02/09/2021, 04/10/2021    TDAP status: Due, Education has been provided regarding the importance of this vaccine. Advised may receive this vaccine at local pharmacy or Health Dept. Aware to provide a copy of the vaccination record if obtained from local pharmacy or Health Dept. Verbalized acceptance and understanding.  Flu Vaccine status: Up to date  Pneumococcal vaccine status: Up to date  Covid-19 vaccine status: Completed vaccines  Qualifies for Shingles Vaccine? Yes   Zostavax completed Yes   Shingrix Completed?: Yes  Screening Tests Health Maintenance  Topic Date Due   TETANUS/TDAP  10/03/2019    COVID-19 Vaccine (3 - Moderna risk series) 01/08/2021   URINE MICROALBUMIN  02/09/2021   INFLUENZA VACCINE  12/03/2021   MAMMOGRAM  11/10/2022   COLONOSCOPY (Pts 45-87yr Insurance coverage will need to be confirmed)  06/22/2023   Pneumonia Vaccine 71 Years old  Completed   DEXA SCAN  Completed   Hepatitis C Screening  Completed   Zoster Vaccines- Shingrix  Completed   HPV VACCINES  Aged Out    Health Maintenance  Health Maintenance Due  Topic Date Due   TETANUS/TDAP  10/03/2019   COVID-19 Vaccine (3 - Moderna risk series) 01/08/2021   URINE MICROALBUMIN  02/09/2021    Colorectal cancer screening: Type of screening: Colonoscopy. Completed 06-21-18. Repeat every 5 years  Mammogram status: Completed 11-09-20. Repeat every year  Bone Density status: Completed 10-04-14. Results reflect: Bone density results: NORMAL. Repeat every 5 years.  Lung Cancer Screening: (Low Dose CT Chest recommended if Age 73-80years, 30 pack-year currently smoking OR have quit w/in 15years.) does not qualify.     Additional Screening:  Hepatitis C Screening: does qualify; Completed 05-30-15  Vision Screening: Recommended annual ophthalmology exams for  early detection of glaucoma and other disorders of the eye. Is the patient up to date with their annual eye exam?  Yes  Who is the provider or what is the name of the office in which the patient attends annual eye exams? Dr Manuella Ghazi If pt is not established with a provider, would they like to be referred to a provider to establish care? No .   Dental Screening: Recommended annual dental exams for proper oral hygiene  Community Resource Referral / Chronic Care Management: CRR required this visit?  No   CCM required this visit?  No      Plan:     I have personally reviewed and noted the following in the patient's chart:   Medical and social history Use of alcohol, tobacco or illicit drugs  Current medications and supplements including opioid  prescriptions.  Functional ability and status Nutritional status Physical activity Advanced directives List of other physicians Hospitalizations, surgeries, and ER visits in previous 12 months Vitals Screenings to include cognitive, depression, and falls Referrals and appointments  In addition, I have reviewed and discussed with patient certain preventive protocols, quality metrics, and best practice recommendations. A written personalized care plan for preventive services as well as general preventive health recommendations were provided to patient.     Shelda Altes, CMA   10/08/2021   Nurse Notes:  Ms. Fahrner , Thank you for taking time to come for your Medicare Wellness Visit. I appreciate your ongoing commitment to your health goals. Please review the following plan we discussed and let me know if I can assist you in the future.   These are the goals we discussed:  Goals      DIET - REDUCE FAT INTAKE     Patient Stated     Would like to travel      Weight (lb) < 200 lb (90.7 kg)        This is a list of the screening recommended for you and due dates:  Health Maintenance  Topic Date Due   Tetanus Vaccine  10/03/2019   COVID-19 Vaccine (3 - Moderna risk series) 01/08/2021   Urine Protein Check  02/09/2021   Flu Shot  12/03/2021   Mammogram  11/10/2022   Colon Cancer Screening  06/22/2023   Pneumonia Vaccine  Completed   DEXA scan (bone density measurement)  Completed   Hepatitis C Screening: USPSTF Recommendation to screen - Ages 66-79 yo.  Completed   Zoster (Shingles) Vaccine  Completed   HPV Vaccine  Aged Out

## 2021-10-08 NOTE — Progress Notes (Unsigned)
10/08/2021                       Endocrinology Follow Up Note    Subjective:    Patient ID: Danielle Howard, female    DOB: 08/31/1950, PCP Lindell Spar, MD   Past Medical History:  Diagnosis Date   Arthritis    Complication of anesthesia    nausea   Diabetes mellitus without complication ([place])    Headache    Hypertension    Hypothyroidism    Hypothyroidism    Iritis    Thyroid disease    Uveitis of left eye 12/29/2017   Past Surgical History:  Procedure Laterality Date   ABDOMINAL HYSTERECTOMY     CATARACT EXTRACTION Left 2019   CHOLECYSTECTOMY     02/2012   COLONOSCOPY N/A 05/25/2013   Procedure: COLONOSCOPY;  Surgeon: Rogene Houston, MD;  [facility];  Service: Endoscopy;  Laterality: N/A;  830-moved to 1055 Ann to notify pt   COLONOSCOPY N/A 06/21/2018   Procedure: COLONOSCOPY;  Surgeon: Rogene Houston, MD;  [facility]: [facility];  Service: Endoscopy;  Laterality: N/A;  1020   EYE SURGERY N/A    Phreesia 07/13/2020   JOINT REPLACEMENT Bilateral    2012,2013-bilateral knees   POLYPECTOMY  06/21/2018   Procedure: POLYPECTOMY;  Surgeon: Rogene Houston, MD;  [facility];  Service: Endoscopy;;   THYROID SURGERY     Social History   Socioeconomic History   Marital status: Single    Spouse name: Not on file   Number of children: Not on file   Years of education: Not on file   Highest education level: Not on file  Occupational History   Not on file  Tobacco Use   Smoking status: Never   Smokeless tobacco: Never  Vaping Use   Vaping Use: Never used  Substance and Sexual Activity   Alcohol use: Yes    Comment: occasional   Drug use: No   Sexual activity: Yes  Other Topics Concern   Not on file  Social History Narrative   Not on file   Social Determinants of Health   Financial Resource Strain: Low Risk    Difficulty of Paying Living Expenses: Not hard at all  Food Insecurity: No Food Insecurity   Worried About Paediatric nurse in the Last Year: Never true   Ran Out of Food in the Last Year: Never true  Transportation Needs: No Transportation Needs   Lack of Transportation (Medical): No   Lack of Transportation (Non-Medical): No  Physical Activity: Insufficiently Active   Days of Exercise per Week: 3 days   Minutes of Exercise per Session: 30 min  Stress: No Stress Concern Present   Feeling of Stress : Not at all  Social Connections: Moderately Isolated   Frequency of Communication with Friends and Family: More than three times a week   Frequency of Social Gatherings with Friends and Family: More than three times a week   Attends Religious Services: More than 4 times per year   Active Member of Clubs or Organizations: No   Attends Archivist Meetings: Never   Marital Status: Never married   Outpatient Encounter Medications as of 10/08/2021  Medication Sig   brimonidine (ALPHAGAN) 0.15 % ophthalmic solution Place 1 drop into both eyes 3 (three) times daily.   Calcium Carbonate-Vit D-Min (CALCIUM 1200 PO) Take by mouth.   dextromethorphan-guaiFENesin (  MUCINEX DM) 30-600 MG 12hr tablet Take 1 tablet by mouth 2 (two) times daily. (Patient not taking: Reported on 10/08/2021)   dorzolamide-timolol (COSOPT) 22.3-6.8 MG/ML ophthalmic solution 2 (two) times daily. Left eye only   folic acid (FOLVITE) 1 MG tablet Take 1 mg by mouth daily.    levothyroxine (SYNTHROID) 150 MCG tablet TAKE 1 TABLET(150 MCG) BY MOUTH DAILY BEFORE BREAKFAST   LUMIGAN 0.01 % SOLN Place 1 drop into the left eye at bedtime.   Multiple Vitamins-Minerals (MULTIVITAMIN WITH MINERALS) tablet Take 1 tablet by mouth daily.   nystatin cream (MYCOSTATIN) Apply to affected area 2 times daily   potassium chloride SA (KLOR-CON M) 20 MEQ tablet Take 1 tablet (20 mEq total) by mouth daily. TAKE 1 TABLET(20 MEQ) BY MOUTH DAILY   torsemide (DEMADEX) 20 MG tablet TAKE 1 TO 2 TABLETS BY MOUTH DAILY AS NEEDED FOR SWELLING (Patient taking  differently: Take 20 mg by mouth daily.)   verapamil (CALAN-SR) 240 MG CR tablet TAKE 1 TABLET BY MOUTH TWICE DAILY   [DISCONTINUED] benzonatate (TESSALON) 100 MG capsule Take 1-2 capsules (100-200 mg total) by mouth 3 (three) times daily as needed for cough. (Patient not taking: Reported on 10/08/2021)   No facility-administered encounter medications on file as of 10/08/2021.   ALLERGIES: Allergies  Allergen Reactions   Acetazolamide    Dicloxacillin Other (See Comments)    GI upset   VACCINATION STATUS: Immunization History  Administered Date(s) Administered   Fluad Quad(high Dose 65+) 01/13/2019, 01/11/2021   Influenza Whole 02/09/2013   Influenza, High Dose Seasonal PF 01/20/2017, 01/27/2018, 01/04/2020   Influenza,inj,Quad PF,6+ Mos 01/15/2015, 01/25/2016   Influenza-Unspecified 01/13/2014   Moderna SARS-COV2 Booster Vaccination 03/21/2020, 12/11/2020   Moderna Sars-Covid-2 Vaccination 06/16/2019, 07/18/2019   Pneumococcal Conjugate-13 01/15/2015   Pneumococcal Polysaccharide-23 01/13/2014   Tdap 10/02/2009   Varicella 03/03/2010   Zoster Recombinat (Shingrix) 02/09/2021, 04/10/2021    Thyroid Problem Presents for follow-up visit. Symptoms include heat intolerance. Patient reports no anxiety, cold intolerance, constipation, depressed mood, diarrhea, fatigue, hair loss, palpitations, tremors, weight gain or weight loss. The symptoms have been stable.   She is on levothyroxine 150 mcg p.o. daily before breakfast.   She reports improvement in her previous symptoms of fatigue.  She presents with 15 pounds of weight loss, mainly intentional.  She has no new complaints today.    she denies dysphagia, SOB, nor voice change.     Review of systems  Constitutional: + Minimally fluctuating body weight,  current Body mass index is 34.73 kg/m. , no fatigue, + subjective hyperthermia, no subjective hypothermia    Objective:    BP (!) 100/56   Pulse 60   Ht '5\' 6"'$  (1.676 m)   Wt  215 lb 3.2 oz (97.6 kg)   BMI 34.73 kg/m   Wt Readings from Last 3 Encounters:  10/08/21 215 lb 3.2 oz (97.6 kg)  09/11/21 214 lb (97.1 kg)  04/12/21 213 lb 0.6 oz (96.6 kg)    BP Readings from Last 3 Encounters:  10/08/21 (!) 100/56  09/11/21 126/71  08/07/21 126/72      Physical Exam- Limited  Constitutional:  Body mass index is 34.73 kg/m. , not in acute distress, normal state of mind     CMP     Component Value Date/Time   NA 144 12/10/2020 1140   K 4.4 12/10/2020 1140   CL 104 12/10/2020 1140   CO2 25 12/10/2020 1140   GLUCOSE 111 (H) 12/10/2020 1140  GLUCOSE 109 (H) 07/05/2020 1928   BUN 32 (H) 12/10/2020 1140   CREATININE 1.33 (H) 12/10/2020 1140   CREATININE 1.28 (H) 02/10/2020 0926   CALCIUM 9.4 12/10/2020 1140   PROT 7.2 12/10/2020 1140   ALBUMIN 4.5 12/10/2020 1140   AST 27 12/10/2020 1140   ALT 17 12/10/2020 1140   ALKPHOS 118 12/10/2020 1140   BILITOT 0.9 12/10/2020 1140   GFRNONAA 23 (L) 07/05/2020 1928   GFRNONAA 41 (L) 03/28/2019 0744   GFRAA 48 (L) 03/28/2019 0744     Diabetic Labs (most recent): Lab Results  Component Value Date   HGBA1C 5.6 10/08/2021   HGBA1C 5.3 02/10/2020   HGBA1C 5.6 08/08/2019     Lipid Panel ( most recent) Lipid Panel     Component Value Date/Time   CHOL 176 02/10/2020 0926   TRIG 106 02/10/2020 0926   HDL 68 02/10/2020 0926   CHOLHDL 2.6 02/10/2020 0926   VLDL 17 07/15/2016 0859   LDLCALC 88 02/10/2020 0926    Results for OLEVIA, WESTERVELT (MRN [id number]) as of 10/05/2020 11:06  Ref. Range 08/08/2019 10:09 02/10/2020 09:26 04/02/2020 07:31 09/26/2020 09:57  TSH Latest Ref Range: 0.450 - 4.500 uIU/mL 1.09 4.57 (H) 0.55 0.158 (L)  T4,Free(Direct) Latest Ref Range: 0.82 - 1.77 ng/dL 1.4 1.4 1.4 1.77      Assessment & Plan:   1. Hypothyroidism r/t Hashimoto's thyroiditis with left Hemithyroidectomy: -Her previsit thyroid function tests are distant with appropriate replacement.  She is advised to continue  levothyroxine 150 mcg p.o. daily before breakfast  .     - We discussed about the correct intake of her thyroid hormone, on empty stomach at fasting, with water, separated by at least 30 minutes from breakfast and other medications,  and separated by more than 4 hours from calcium, iron, multivitamins, acid reflux medications (PPIs). -Patient is made aware of the fact that thyroid hormone replacement is needed for life, dose to be adjusted by periodic monitoring of thyroid function tests.  For her prediabetes, no medication intervention is necessary at this time.  Her most recent A1c was 5.3%. Her last thyroid u/s from 01/2017 showed normal size right lobe with no nodules.- no need for additional follow up at this time.   - I advised patient to maintain close follow up with Lindell Spar, MD for primary care needs.    I spent 21 minutes in the care of the patient today including review of labs from Thyroid Function, CMP, and other relevant labs ; imaging/biopsy records (current and previous including abstractions from other facilities); face-to-face time discussing  her lab results and symptoms, medications doses, her options of short and long term treatment based on the latest standards of care / guidelines;   and documenting the encounter.  Yasuko Lapage Aubry  participated in the discussions, expressed understanding, and voiced agreement with the above plans.  All questions were answered to her satisfaction. she is encouraged to contact clinic should she have any questions or concerns prior to her return visit.   Follow up plan: Return in about 6 months (around 04/09/2022) for F/U with Pre-visit Labs.  Rayetta Pigg, [facility] [address] [place], [place] [address] Phone: [phone] Fax: [phone]   10/08/2021, 5:55 PM

## 2021-10-11 ENCOUNTER — Other Ambulatory Visit: Payer: Self-pay | Admitting: Internal Medicine

## 2021-10-11 ENCOUNTER — Ambulatory Visit (INDEPENDENT_AMBULATORY_CARE_PROVIDER_SITE_OTHER): Payer: Medicare Other | Admitting: Internal Medicine

## 2021-10-11 ENCOUNTER — Encounter: Payer: Self-pay | Admitting: Internal Medicine

## 2021-10-11 VITALS — BP 118/72 | HR 60 | Resp 18 | Ht 66.0 in | Wt 214.0 lb

## 2021-10-11 DIAGNOSIS — N1832 Chronic kidney disease, stage 3b: Secondary | ICD-10-CM | POA: Diagnosis not present

## 2021-10-11 DIAGNOSIS — I10 Essential (primary) hypertension: Secondary | ICD-10-CM | POA: Diagnosis not present

## 2021-10-11 DIAGNOSIS — L281 Prurigo nodularis: Secondary | ICD-10-CM | POA: Insufficient documentation

## 2021-10-11 DIAGNOSIS — B372 Candidiasis of skin and nail: Secondary | ICD-10-CM

## 2021-10-11 MED ORDER — TRIAMCINOLONE ACETONIDE 0.1 % EX CREA: 1.0000 "application " | TOPICAL_CREAM | Freq: Two times a day (BID) | CUTANEOUS | 0 refills | Status: DC

## 2021-10-11 MED ORDER — LISINOPRIL 10 MG PO TABS: 10.0000 mg | ORAL_TABLET | Freq: Every day | ORAL | 0 refills | Status: DC

## 2021-10-11 MED ORDER — KETOCONAZOLE 2 % EX CREA: 1.0000 "application " | TOPICAL_CREAM | Freq: Every day | CUTANEOUS | 0 refills | Status: DC

## 2021-10-11 NOTE — Patient Instructions (Addendum)
Please stop taking Verapamil and start taking Lisinopril 10 mg once daily.  Please use Ketoconazole for rash underneath the breast.  Please use Triamcinolone cream for itching.  Please continue taking other medications as prescribed.  Please continue to follow low salt diet and ambulate as tolerated.

## 2021-10-11 NOTE — Assessment & Plan Note (Signed)
Last BMP reviewed - GFR improved to 77 She has stopped taking soft drinks cyclosporine and acyclovir discontinued Advised to maintain proper hydration Avoid nephrotoxic agents followed by nephrology

## 2021-10-11 NOTE — Progress Notes (Signed)
Established Patient Office Visit  Subjective:  Patient ID: Danielle Howard, female    DOB: June 22, 1950  Age: 71 y.o. MRN: [id number]  CC:  Chief Complaint  Patient presents with   Follow-up    6 month follow up     HPI SIEANNA Howard is a 71 y.o. female with past medical history of HTN, PVD, hypothyroidism, gluacoma, uveitis and obesity who presents for f/u of her chronic medical conditions.  HTN: BP is well-controlled. Takes medications regularly. Patient denies headache, dizziness, chest pain, dyspnea or palpitations. She asks if she can be taken off of one antihypertensives. She is on Verapamil 240 mg BID and Lisinopril 2.5 mg QD currently.  She complains of rash underneath her breasts, for which she was given nystatin cream and powder.  She still has itching rash underneath her breasts.  She also complains of itching over her arms and back.  She reports having nodules over left arm and near left eye, and wants them removed.  She has itching over nodule of left arm as well.      Past Medical History:  Diagnosis Date   Arthritis    Complication of anesthesia    nausea   Diabetes mellitus without complication ([place])    Headache    Hypertension    Hypothyroidism    Hypothyroidism    Iritis    Thyroid disease    Uveitis of left eye 12/29/2017    Past Surgical History:  Procedure Laterality Date   ABDOMINAL HYSTERECTOMY     CATARACT EXTRACTION Left 2019   CHOLECYSTECTOMY     02/2012   COLONOSCOPY N/A 05/25/2013   Procedure: COLONOSCOPY;  Surgeon: Rogene Houston, MD;  [facility];  Service: Endoscopy;  Laterality: N/A;  830-moved to 1055 Ann to notify pt   COLONOSCOPY N/A 06/21/2018   Procedure: COLONOSCOPY;  Surgeon: Rogene Houston, MD;  [facility];  Service: Endoscopy;  Laterality: N/A;  1020   EYE SURGERY N/A    Phreesia 07/13/2020   JOINT REPLACEMENT Bilateral    2012,2013-bilateral knees   POLYPECTOMY  06/21/2018   Procedure: POLYPECTOMY;   Surgeon: Rogene Houston, MD;  [facility];  Service: Endoscopy;;   THYROID SURGERY      Family History  Problem Relation Age of Onset   Arthritis Mother    Heart disease Mother    Hypertension Mother    Arthritis Father    Hyperlipidemia Father    HIV/AIDS Brother     Social History   Socioeconomic History   Marital status: Single    Spouse name: Not on file   Number of children: Not on file   Years of education: Not on file   Highest education level: Not on file  Occupational History   Not on file  Tobacco Use   Smoking status: Never   Smokeless tobacco: Never  Vaping Use   Vaping Use: Never used  Substance and Sexual Activity   Alcohol use: Yes    Comment: occasional   Drug use: No   Sexual activity: Yes  Other Topics Concern   Not on file  Social History Narrative   Not on file   Social Determinants of Health   Financial Resource Strain: Low Risk  (10/08/2021)   Overall Financial Resource Strain (CARDIA)    Difficulty of Paying Living Expenses: Not hard at all  Food Insecurity: No Food Insecurity (10/08/2021)   Hunger Vital Sign    Worried  About Running Out of Food in the Last Year: Never true    Ran Out of Food in the Last Year: Never true  Transportation Needs: No Transportation Needs (10/08/2021)   PRAPARE - Hydrologist (Medical): No    Lack of Transportation (Non-Medical): No  Physical Activity: Insufficiently Active (10/08/2021)   Exercise Vital Sign    Days of Exercise per Week: 3 days    Minutes of Exercise per Session: 30 min  Stress: No Stress Concern Present (10/08/2021)   [place]    Feeling of Stress : Not at all  Social Connections: Moderately Isolated (10/08/2021)   Social Connection and Isolation Panel [NHANES]    Frequency of Communication with Friends and Family: More than three times a week    Frequency of Social Gatherings with Friends  and Family: More than three times a week    Attends Religious Services: More than 4 times per year    Active Member of [employer] or Organizations: No    Attends Archivist Meetings: Never    Marital Status: Never married  Intimate Partner Violence: Not At Risk (10/08/2021)   Humiliation, Afraid, Rape, and Kick questionnaire    Fear of Current or Ex-Partner: No    Emotionally Abused: No    Physically Abused: No    Sexually Abused: No    Outpatient Medications Prior to Visit  Medication Sig Dispense Refill   brimonidine (ALPHAGAN) 0.15 % ophthalmic solution Place 1 drop into both eyes 3 (three) times daily. 5 mL 12   Calcium Carbonate-Vit D-Min (CALCIUM 1200 PO) Take by mouth.     dextromethorphan-guaiFENesin (MUCINEX DM) 30-600 MG 12hr tablet Take 1 tablet by mouth 2 (two) times daily.     dorzolamide-timolol (COSOPT) 22.3-6.8 MG/ML ophthalmic solution 2 (two) times daily. Left eye only     folic acid (FOLVITE) 1 MG tablet Take 1 mg by mouth daily.      levothyroxine (SYNTHROID) 150 MCG tablet TAKE 1 TABLET(150 MCG) BY MOUTH DAILY BEFORE BREAKFAST 90 tablet 1   LUMIGAN 0.01 % SOLN Place 1 drop into the left eye at bedtime.     Multiple Vitamins-Minerals (MULTIVITAMIN WITH MINERALS) tablet Take 1 tablet by mouth daily.     potassium chloride SA (KLOR-CON M) 20 MEQ tablet Take 1 tablet (20 mEq total) by mouth daily. TAKE 1 TABLET(20 MEQ) BY MOUTH DAILY 90 tablet 0   torsemide (DEMADEX) 20 MG tablet TAKE 1 TO 2 TABLETS BY MOUTH DAILY AS NEEDED FOR SWELLING (Patient taking differently: Take 20 mg by mouth daily.) 180 tablet 2   lisinopril (ZESTRIL) 2.5 MG tablet Take 2.5 mg by mouth daily.     nystatin cream (MYCOSTATIN) Apply to affected area 2 times daily 60 g 0   verapamil (CALAN-SR) 240 MG CR tablet TAKE 1 TABLET BY MOUTH TWICE DAILY 180 tablet 1   No facility-administered medications prior to visit.    Allergies  Allergen Reactions   Acetazolamide    Dicloxacillin Other (See  Comments)    GI upset    ROS Review of Systems  Constitutional:  Negative for chills and fever.  HENT:  Negative for congestion, sinus pressure, sinus pain and sore throat.   Eyes:  Negative for pain and discharge.  Respiratory:  Negative for cough and shortness of breath.   Cardiovascular:  Positive for leg swelling. Negative for chest pain and palpitations.  Gastrointestinal:  Negative for abdominal pain,  constipation, diarrhea, nausea and vomiting.  Endocrine: Negative for polydipsia and polyuria.  Genitourinary:  Negative for dysuria and hematuria.  Musculoskeletal:  Negative for neck pain and neck stiffness.  Skin:  Positive for rash.  Neurological:  Negative for dizziness and weakness.  Psychiatric/Behavioral:  Negative for agitation and behavioral problems.       Objective:    Physical Exam Vitals reviewed.  Constitutional:      General: She is not in acute distress.    Appearance: She is not diaphoretic.  HENT:     Head: Normocephalic and atraumatic.     Nose: Nose normal.     Mouth/Throat:     Mouth: Mucous membranes are moist.  Eyes:     General: No scleral icterus.    Extraocular Movements: Extraocular movements intact.  Cardiovascular:     Rate and Rhythm: Normal rate and regular rhythm.     Pulses: Normal pulses.     Heart sounds: Normal heart sounds. No murmur heard. Pulmonary:     Breath sounds: Normal breath sounds. No wheezing or rales.  Musculoskeletal:     Cervical back: Neck supple. No tenderness.     Right lower leg: Edema (2+) present.     Left lower leg: Edema (2+) present.  Skin:    General: Skin is warm.     Findings: Rash (Whitish patches underneath breasts) present.     Comments: 2 masses noted over right forearm - about 1 cm and 2 cm in diameter, nontender, likely lipoma Brownish nodules -1 near left eye and 1 over left arm  Neurological:     General: No focal deficit present.     Mental Status: She is alert and oriented to person,  place, and time.  Psychiatric:        Mood and Affect: Mood normal.        Behavior: Behavior normal.     BP 118/72 (BP Location: Right Arm, Patient Position: Sitting, Cuff Size: Normal)   Pulse 60   Resp 18   Ht _0  (1.676 m)   Wt 214 lb (97.1 kg)   SpO2 96%   BMI 34.54 kg/m  Wt Readings from Last 3 Encounters:  10/11/21 214 lb (97.1 kg)  10/08/21 215 lb 3.2 oz (97.6 kg)  09/11/21 214 lb (97.1 kg)    Lab Results  Component Value Date   TSH 3.840 10/02/2021   Lab Results  Component Value Date   WBC 4.3 12/10/2020   HGB 12.5 12/10/2020   HCT 38.3 12/10/2020   MCV 99 (H) 12/10/2020   PLT 310 12/10/2020   Lab Results  Component Value Date   NA 144 12/10/2020   K 4.4 12/10/2020   CO2 25 12/10/2020   GLUCOSE 111 (H) 12/10/2020   BUN 32 (H) 12/10/2020   CREATININE 1.33 (H) 12/10/2020   BILITOT 0.9 12/10/2020   ALKPHOS 118 12/10/2020   AST 27 12/10/2020   ALT 17 12/10/2020   PROT 7.2 12/10/2020   ALBUMIN 4.5 12/10/2020   CALCIUM 9.4 12/10/2020   ANIONGAP 12 07/05/2020   EGFR 43 (L) 12/10/2020   Lab Results  Component Value Date   CHOL 176 02/10/2020   Lab Results  Component Value Date   HDL 68 02/10/2020   Lab Results  Component Value Date   LDLCALC 88 02/10/2020   Lab Results  Component Value Date   TRIG 106 02/10/2020   Lab Results  Component Value Date   CHOLHDL 2.6 02/10/2020   Lab  Results  Component Value Date   HGBA1C 5.6 10/08/2021      Assessment & Plan:   Problem List Items Addressed This Visit       Cardiovascular and Mediastinum   Essential hypertension, benign - Primary    BP Readings from Last 1 Encounters:  10/11/21 118/72  Well-controlled with Verapamil and lisinopril DC Verapamil and increased dose of lisinopril to 10 mg QD Followed by Nephrology Counseled for compliance with the medications Advised DASH diet and moderate exercise/walking as tolerated        Musculoskeletal and Integument   Candidiasis,  intertrigo    Has tried nystatin cream and powder with no relief Started ketoconazole cream      Relevant Medications   ketoconazole (NIZORAL) 2 % cream   Prurigo nodularis    Have itching nodules over face near left eye and left arm Kenalog cream for itching Referred to dermatology      Relevant Medications   triamcinolone cream (KENALOG) 0.1 %   Other Relevant Orders   Ambulatory referral to Dermatology     Genitourinary   Chronic kidney disease (CKD), stage III (moderate) (HCC)    Last BMP reviewed - GFR improved to 77 She has stopped taking soft drinks cyclosporine and acyclovir discontinued Advised to maintain proper hydration Avoid nephrotoxic agents followed by nephrology      Relevant Orders   Microalbumin / creatinine urine ratio    Meds ordered this encounter  Medications   ketoconazole (NIZORAL) 2 % cream    Sig: Apply 1 application  topically daily.    Dispense:  15 g    Refill:  0   triamcinolone cream (KENALOG) 0.1 %    Sig: Apply 1 application  topically 2 (two) times daily.    Dispense:  30 g    Refill:  0   DISCONTD: lisinopril (ZESTRIL) 10 MG tablet    Sig: Take 1 tablet (10 mg total) by mouth daily.    Dispense:  30 tablet    Refill:  0    Follow-up: Return in about 4 weeks (around 11/08/2021).    Lindell Spar, MD

## 2021-10-11 NOTE — Assessment & Plan Note (Signed)
Have itching nodules over face near left eye and left arm Kenalog cream for itching Referred to dermatology

## 2021-10-11 NOTE — Assessment & Plan Note (Signed)
Has tried nystatin cream and powder with no relief Started ketoconazole cream

## 2021-10-11 NOTE — Assessment & Plan Note (Signed)
BP Readings from Last 1 Encounters:  10/11/21 118/72   Well-controlled with Verapamil and lisinopril DC Verapamil and increased dose of lisinopril to 10 mg QD Followed by Nephrology Counseled for compliance with the medications Advised DASH diet and moderate exercise/walking as tolerated

## 2021-10-13 LAB — MICROALBUMIN / CREATININE URINE RATIO
Creatinine, Urine: 84.2 mg/dL
Microalb/Creat Ratio: 22 mg/g creat (ref 0–29)
Microalbumin, Urine: 18.7 ug/mL

## 2021-10-17 ENCOUNTER — Other Ambulatory Visit (HOSPITAL_COMMUNITY): Payer: Self-pay | Admitting: Nephrology

## 2021-10-17 DIAGNOSIS — N2581 Secondary hyperparathyroidism of renal origin: Secondary | ICD-10-CM

## 2021-10-23 ENCOUNTER — Other Ambulatory Visit: Payer: Self-pay | Admitting: Internal Medicine

## 2021-10-24 DIAGNOSIS — E211 Secondary hyperparathyroidism, not elsewhere classified: Secondary | ICD-10-CM | POA: Diagnosis not present

## 2021-10-24 DIAGNOSIS — R768 Other specified abnormal immunological findings in serum: Secondary | ICD-10-CM | POA: Diagnosis not present

## 2021-10-24 DIAGNOSIS — R809 Proteinuria, unspecified: Secondary | ICD-10-CM | POA: Diagnosis not present

## 2021-10-24 DIAGNOSIS — N182 Chronic kidney disease, stage 2 (mild): Secondary | ICD-10-CM | POA: Diagnosis not present

## 2021-10-25 ENCOUNTER — Encounter (HOSPITAL_COMMUNITY)
Admission: RE | Admit: 2021-10-25 | Discharge: 2021-10-25 | Disposition: A | Discharge status: Home / Self Care | Payer: Medicare Other | Source: Ambulatory Visit | Attending: Nephrology | Admitting: Nephrology

## 2021-10-25 DIAGNOSIS — Z87448 Personal history of other diseases of urinary system: Secondary | ICD-10-CM | POA: Diagnosis not present

## 2021-10-25 DIAGNOSIS — E211 Secondary hyperparathyroidism, not elsewhere classified: Secondary | ICD-10-CM | POA: Diagnosis not present

## 2021-10-25 DIAGNOSIS — N2581 Secondary hyperparathyroidism of renal origin: Secondary | ICD-10-CM | POA: Insufficient documentation

## 2021-10-25 DIAGNOSIS — E890 Postprocedural hypothyroidism: Secondary | ICD-10-CM | POA: Diagnosis not present

## 2021-10-25 MED ORDER — TECHNETIUM TC 99M SESTAMIBI GENERIC - CARDIOLITE
25.0000 | Freq: Once | INTRAVENOUS | Status: AC | PRN
  Administered 2021-10-25: 26 via INTRAVENOUS

## 2021-10-26 DIAGNOSIS — Z23 Encounter for immunization: Secondary | ICD-10-CM | POA: Diagnosis not present

## 2021-10-31 DIAGNOSIS — E212 Other hyperparathyroidism: Secondary | ICD-10-CM | POA: Diagnosis not present

## 2021-10-31 DIAGNOSIS — R809 Proteinuria, unspecified: Secondary | ICD-10-CM | POA: Diagnosis not present

## 2021-10-31 DIAGNOSIS — I129 Hypertensive chronic kidney disease with stage 1 through stage 4 chronic kidney disease, or unspecified chronic kidney disease: Secondary | ICD-10-CM | POA: Diagnosis not present

## 2021-10-31 DIAGNOSIS — N182 Chronic kidney disease, stage 2 (mild): Secondary | ICD-10-CM | POA: Diagnosis not present

## 2021-11-07 DIAGNOSIS — D2362 Other benign neoplasm of skin of left upper limb, including shoulder: Secondary | ICD-10-CM | POA: Diagnosis not present

## 2021-11-07 DIAGNOSIS — L298 Other pruritus: Secondary | ICD-10-CM | POA: Diagnosis not present

## 2021-11-07 DIAGNOSIS — L720 Epidermal cyst: Secondary | ICD-10-CM | POA: Diagnosis not present

## 2021-11-08 ENCOUNTER — Ambulatory Visit (INDEPENDENT_AMBULATORY_CARE_PROVIDER_SITE_OTHER): Payer: Medicare Other | Admitting: Podiatry

## 2021-11-08 ENCOUNTER — Encounter: Payer: Self-pay | Admitting: Podiatry

## 2021-11-08 DIAGNOSIS — L600 Ingrowing nail: Secondary | ICD-10-CM

## 2021-11-08 DIAGNOSIS — B351 Tinea unguium: Secondary | ICD-10-CM | POA: Diagnosis not present

## 2021-11-08 NOTE — Patient Instructions (Signed)

## 2021-11-09 NOTE — Progress Notes (Signed)
Subjective:   Patient ID: Danielle Howard, female   DOB: 71 y.o.   MRN: [id number]   HPI Patient presents stating that she has nail disease of her nails and she is developed pain in her right second nail that is been hard for her to wear shoe gear with comfortably and its been going on now for several weeks and she is tried to soak it and trim it.  Patient does not smoke likes to be active   Review of Systems  All other systems reviewed and are negative.       Objective:  Physical Exam Vitals and nursing note reviewed.  Constitutional:      Appearance: She is well-developed.  Pulmonary:     Effort: Pulmonary effort is normal.  Musculoskeletal:        General: Normal range of motion.  Skin:    General: Skin is warm.  Neurological:     Mental Status: She is alert.     Neurovascular status found to be intact muscle strength found to be adequate range of motion subtalar midtarsal joint adequate.  Patient does have dystrophic damaged toenails and on the right second nail medial border there is incurvation of the bed and it is painful when pressed with irritation of the tissue but no active drainage or redness noted.  Patient has good digital perfusion well oriented x3     Assessment:  Ingrown toenail deformity right second toe medial border along with thickened brittle nailbeds 1-5 both feet secondary to just nail disease     Plan:  H&P reviewed both conditions and we will get a focus on the painful nailbed first.  I did explain fixing this and allowed her to read consent form for procedure and she is willing to accept risk wants surgery and signed consent form.  Today I went ahead and I infiltrated the right second toe 60 mg like Marcaine mixture sterile prep was done and using sterile instrumentation remove the nail border exposed matrix applied phenol 3 applications 30 seconds followed by alcohol lavage sterile dressing gave instructions on soaks and leave dressing on 24 hours but  take it off earlier if throbbing were to occur.  All questions answered today

## 2021-11-11 ENCOUNTER — Ambulatory Visit (HOSPITAL_COMMUNITY)
Admission: RE | Admit: 2021-11-11 | Discharge: 2021-11-11 | Disposition: A | Discharge status: Home / Self Care | Payer: Medicare Other | Source: Ambulatory Visit | Attending: Internal Medicine | Admitting: Internal Medicine

## 2021-11-11 DIAGNOSIS — Z1231 Encounter for screening mammogram for malignant neoplasm of breast: Secondary | ICD-10-CM | POA: Insufficient documentation

## 2021-11-14 ENCOUNTER — Telehealth: Payer: Self-pay

## 2021-11-14 ENCOUNTER — Ambulatory Visit (INDEPENDENT_AMBULATORY_CARE_PROVIDER_SITE_OTHER): Payer: Medicare Other | Admitting: Internal Medicine

## 2021-11-14 ENCOUNTER — Other Ambulatory Visit: Payer: Self-pay | Admitting: Internal Medicine

## 2021-11-14 ENCOUNTER — Encounter: Payer: Self-pay | Admitting: Internal Medicine

## 2021-11-14 VITALS — BP 118/72 | HR 92 | Resp 16 | Ht 66.0 in | Wt 208.2 lb

## 2021-11-14 DIAGNOSIS — N1832 Chronic kidney disease, stage 3b: Secondary | ICD-10-CM

## 2021-11-14 DIAGNOSIS — I890 Lymphedema, not elsewhere classified: Secondary | ICD-10-CM | POA: Diagnosis not present

## 2021-11-14 DIAGNOSIS — I10 Essential (primary) hypertension: Secondary | ICD-10-CM | POA: Diagnosis not present

## 2021-11-14 NOTE — Assessment & Plan Note (Signed)
BP Readings from Last 1 Encounters:  11/14/21 118/72   Well-controlled with lisinopril and amlodipine now Followed by Nephrology Counseled for compliance with the medications Advised DASH diet and moderate exercise/walking as tolerated

## 2021-11-14 NOTE — Assessment & Plan Note (Signed)
Compression stocking Lymphedema pump PRN Demadex PRN, although advised to use cautiously to avoid risk of dehydration Leg elevation

## 2021-11-14 NOTE — Progress Notes (Signed)
Established Patient Office Visit  Subjective:  Patient ID: Danielle Howard, female    DOB: 1951-01-26  Age: 71 y.o. MRN: [id number]  CC:  Chief Complaint  Patient presents with   Follow-up    4 week follow up     HPI Danielle Howard is a 71 y.o. female with past medical history of HTN, PVD, hypothyroidism, gluacoma, uveitis and obesity who presents for f/u of her HTN.  HTN: Her BP is well controlled today.  Her dose of lisinopril was increased to 10 mg daily in the last visit and verapamil was discontinued.  She had visit with Dr. Theador Hawthorne for her CKD and amlodipine was added as her blood pressure was elevated during that visit.  She denies any headache, dizziness, chest pain, dyspnea or palpitations.     Past Medical History:  Diagnosis Date   Arthritis    Complication of anesthesia    nausea   Headache    Hypertension    Hypothyroidism    Hypothyroidism    Iritis    Thyroid disease    Uveitis of left eye 12/29/2017    Past Surgical History:  Procedure Laterality Date   ABDOMINAL HYSTERECTOMY     CATARACT EXTRACTION Left 2019   CHOLECYSTECTOMY     02/2012   COLONOSCOPY N/A 05/25/2013   Procedure: COLONOSCOPY;  Surgeon: Rogene Houston, MD;  [facility];  Service: Endoscopy;  Laterality: N/A;  830-moved to 1055 Ann to notify pt   COLONOSCOPY N/A 06/21/2018   Procedure: COLONOSCOPY;  Surgeon: Rogene Houston, MD;  [facility];  Service: Endoscopy;  Laterality: N/A;  1020   EYE SURGERY N/A    Phreesia 07/13/2020   JOINT REPLACEMENT Bilateral    2012,2013-bilateral knees   POLYPECTOMY  06/21/2018   Procedure: POLYPECTOMY;  Surgeon: Rogene Houston, MD;  [facility];  Service: Endoscopy;;   THYROID SURGERY      Family History  Problem Relation Age of Onset   Arthritis Mother    Heart disease Mother    Hypertension Mother    Arthritis Father    Hyperlipidemia Father    HIV/AIDS Brother     Social History   Socioeconomic  History   Marital status: Single    Spouse name: Not on file   Number of children: Not on file   Years of education: Not on file   Highest education level: Not on file  Occupational History   Not on file  Tobacco Use   Smoking status: Never   Smokeless tobacco: Never  Vaping Use   Vaping Use: Never used  Substance and Sexual Activity   Alcohol use: Yes    Comment: occasional   Drug use: No   Sexual activity: Yes  Other Topics Concern   Not on file  Social History Narrative   Not on file   Social Determinants of Health   Financial Resource Strain: Low Risk  (10/08/2021)   Overall Financial Resource Strain (CARDIA)    Difficulty of Paying Living Expenses: Not hard at all  Food Insecurity: No Food Insecurity (10/08/2021)   Hunger Vital Sign    Worried About Running Out of Food in the Last Year: Never true    [place] in the Last Year: Never true  Transportation Needs: No Transportation Needs (10/08/2021)   PRAPARE - Hydrologist (Medical): No    Lack of Transportation (Non-Medical): No  Physical Activity:  Insufficiently Active (10/08/2021)   Exercise Vital Sign    Days of Exercise per Week: 3 days    Minutes of Exercise per Session: 30 min  Stress: No Stress Concern Present (10/08/2021)   [place]    Feeling of Stress : Not at all  Social Connections: Moderately Isolated (10/08/2021)   Social Connection and Isolation Panel [NHANES]    Frequency of Communication with Friends and Family: More than three times a week    Frequency of Social Gatherings with Friends and Family: More than three times a week    Attends Religious Services: More than 4 times per year    Active Member of [employer] or Organizations: No    Attends Archivist Meetings: Never    Marital Status: Never married  Intimate Partner Violence: Not At Risk (10/08/2021)   Humiliation, Afraid, Rape, and Kick  questionnaire    Fear of Current or Ex-Partner: No    Emotionally Abused: No    Physically Abused: No    Sexually Abused: No    Outpatient Medications Prior to Visit  Medication Sig Dispense Refill   brimonidine (ALPHAGAN) 0.15 % ophthalmic solution Place 1 drop into both eyes 3 (three) times daily. 5 mL 12   Calcium Carbonate-Vit D-Min (CALCIUM 1200 PO) Take by mouth.     dextromethorphan-guaiFENesin (MUCINEX DM) 30-600 MG 12hr tablet Take 1 tablet by mouth 2 (two) times daily.     dorzolamide-timolol (COSOPT) 22.3-6.8 MG/ML ophthalmic solution 2 (two) times daily. Left eye only     doxycycline (VIBRA-TABS) 100 MG tablet Take 100 mg by mouth 2 (two) times daily.     folic acid (FOLVITE) 1 MG tablet Take 1 mg by mouth daily.      ketoconazole (NIZORAL) 2 % cream Apply 1 application  topically daily. 15 g 0   levothyroxine (SYNTHROID) 150 MCG tablet TAKE 1 TABLET(150 MCG) BY MOUTH DAILY BEFORE BREAKFAST 90 tablet 1   lisinopril (ZESTRIL) 10 MG tablet TAKE 1 TABLET(10 MG) BY MOUTH DAILY 90 tablet 2   LUMIGAN 0.01 % SOLN Place 1 drop into the left eye at bedtime.     Multiple Vitamins-Minerals (MULTIVITAMIN WITH MINERALS) tablet Take 1 tablet by mouth daily.     potassium chloride SA (KLOR-CON M) 20 MEQ tablet TAKE 1 TABLET BY MOUTH EVERY DAY 90 tablet 0   torsemide (DEMADEX) 20 MG tablet TAKE 1 TO 2 TABLETS BY MOUTH DAILY AS NEEDED FOR SWELLING 180 tablet 2   triamcinolone cream (KENALOG) 0.1 % Apply 1 application  topically 2 (two) times daily. 30 g 0   No facility-administered medications prior to visit.    Allergies  Allergen Reactions   Acetazolamide    Dicloxacillin Other (See Comments)    GI upset    ROS Review of Systems  Constitutional:  Negative for chills and fever.  HENT:  Negative for congestion, sinus pressure, sinus pain and sore throat.   Eyes:  Negative for pain and discharge.  Respiratory:  Negative for cough and shortness of breath.   Cardiovascular:  Positive  for leg swelling. Negative for chest pain and palpitations.  Gastrointestinal:  Negative for abdominal pain, constipation, diarrhea, nausea and vomiting.  Endocrine: Negative for polydipsia and polyuria.  Genitourinary:  Negative for dysuria and hematuria.  Musculoskeletal:  Negative for neck pain and neck stiffness.  Skin:  Negative for rash.  Neurological:  Negative for dizziness and weakness.  Psychiatric/Behavioral:  Negative for agitation and  behavioral problems.       Objective:    Physical Exam Vitals reviewed.  Constitutional:      General: She is not in acute distress.    Appearance: She is not diaphoretic.  HENT:     Head: Normocephalic and atraumatic.     Nose: Nose normal.     Mouth/Throat:     Mouth: Mucous membranes are moist.  Eyes:     General: No scleral icterus.    Extraocular Movements: Extraocular movements intact.  Cardiovascular:     Rate and Rhythm: Normal rate and regular rhythm.     Pulses: Normal pulses.     Heart sounds: Normal heart sounds. No murmur heard. Pulmonary:     Breath sounds: Normal breath sounds. No wheezing or rales.  Musculoskeletal:     Cervical back: Neck supple. No tenderness.     Right lower leg: Edema (1+) present.     Left lower leg: Edema (1+) present.  Skin:    General: Skin is warm.     Findings: No rash.  Neurological:     General: No focal deficit present.     Mental Status: She is alert and oriented to person, place, and time.  Psychiatric:        Mood and Affect: Mood normal.        Behavior: Behavior normal.     BP 118/72 (BP Location: Right Arm, Patient Position: Sitting, Cuff Size: Normal)   Pulse 92   Resp 16   Ht '5\' 6"'  (1.676 m)   Wt 208 lb 3.2 oz (94.4 kg)   SpO2 98%   BMI 33.60 kg/m  Wt Readings from Last 3 Encounters:  11/14/21 208 lb 3.2 oz (94.4 kg)  10/11/21 214 lb (97.1 kg)  10/08/21 215 lb 3.2 oz (97.6 kg)    Lab Results  Component Value Date   TSH 3.840 10/02/2021   Lab Results   Component Value Date   WBC 4.3 12/10/2020   HGB 12.5 12/10/2020   HCT 38.3 12/10/2020   MCV 99 (H) 12/10/2020   PLT 310 12/10/2020   Lab Results  Component Value Date   NA 144 12/10/2020   K 4.4 12/10/2020   CO2 25 12/10/2020   GLUCOSE 111 (H) 12/10/2020   BUN 32 (H) 12/10/2020   CREATININE 1.33 (H) 12/10/2020   BILITOT 0.9 12/10/2020   ALKPHOS 118 12/10/2020   AST 27 12/10/2020   ALT 17 12/10/2020   PROT 7.2 12/10/2020   ALBUMIN 4.5 12/10/2020   CALCIUM 9.4 12/10/2020   ANIONGAP 12 07/05/2020   EGFR 43 (L) 12/10/2020   Lab Results  Component Value Date   CHOL 176 02/10/2020   Lab Results  Component Value Date   HDL 68 02/10/2020   Lab Results  Component Value Date   LDLCALC 88 02/10/2020   Lab Results  Component Value Date   TRIG 106 02/10/2020   Lab Results  Component Value Date   CHOLHDL 2.6 02/10/2020   Lab Results  Component Value Date   HGBA1C 5.6 10/08/2021      Assessment & Plan:   Problem List Items Addressed This Visit       Cardiovascular and Mediastinum   Essential hypertension, benign - Primary    BP Readings from Last 1 Encounters:  11/14/21 118/72  Well-controlled with lisinopril and amlodipine now Followed by Nephrology Counseled for compliance with the medications Advised DASH diet and moderate exercise/walking as tolerated  Genitourinary   Chronic kidney disease (CKD), stage III (moderate) (HCC)    Last BMP reviewed - GFR improved to 63 She has stopped taking soft drinks cyclosporine and acyclovir discontinued Advised to maintain proper hydration Avoid nephrotoxic agents Followed by nephrology        Other   Lymphedema    Compression stocking Lymphedema pump PRN Demadex PRN, although advised to use cautiously to avoid risk of dehydration Leg elevation       No orders of the defined types were placed in this encounter.   Follow-up: Return in about 6 months (around 05/17/2022) for HTN and hypothyroidism.     Lindell Spar, MD

## 2021-11-14 NOTE — Assessment & Plan Note (Addendum)
Last BMP reviewed - GFR improved to 63 She has stopped taking soft drinks cyclosporine and acyclovir discontinued Advised to maintain proper hydration Avoid nephrotoxic agents Followed by nephrology

## 2021-11-14 NOTE — Telephone Encounter (Signed)
Pt advised with verbal understanding  °

## 2021-11-14 NOTE — Patient Instructions (Signed)
Please continue taking medications as prescribed.  Please continue to follow low salt diet and ambulate as tolerated. 

## 2021-11-14 NOTE — Telephone Encounter (Signed)
Patient called back she couldn't remember the name of the medication she got from her kidney doctor at the time of her appt this morning but its amlodipine '5mg'$  and she is currently taking this just [facility]

## 2021-11-27 DIAGNOSIS — H401112 Primary open-angle glaucoma, right eye, moderate stage: Secondary | ICD-10-CM | POA: Diagnosis not present

## 2021-11-27 DIAGNOSIS — H4042X3 Glaucoma secondary to eye inflammation, left eye, severe stage: Secondary | ICD-10-CM | POA: Diagnosis not present

## 2022-01-15 DIAGNOSIS — Z23 Encounter for immunization: Secondary | ICD-10-CM | POA: Diagnosis not present

## 2022-01-28 ENCOUNTER — Other Ambulatory Visit: Payer: Self-pay | Admitting: Internal Medicine

## 2022-02-05 DIAGNOSIS — N182 Chronic kidney disease, stage 2 (mild): Secondary | ICD-10-CM | POA: Diagnosis not present

## 2022-02-05 DIAGNOSIS — I129 Hypertensive chronic kidney disease with stage 1 through stage 4 chronic kidney disease, or unspecified chronic kidney disease: Secondary | ICD-10-CM | POA: Diagnosis not present

## 2022-02-05 DIAGNOSIS — R809 Proteinuria, unspecified: Secondary | ICD-10-CM | POA: Diagnosis not present

## 2022-02-05 DIAGNOSIS — E212 Other hyperparathyroidism: Secondary | ICD-10-CM | POA: Diagnosis not present

## 2022-02-13 ENCOUNTER — Encounter: Payer: Self-pay | Admitting: Podiatry

## 2022-02-13 ENCOUNTER — Ambulatory Visit (INDEPENDENT_AMBULATORY_CARE_PROVIDER_SITE_OTHER): Payer: Medicare Other | Admitting: Podiatry

## 2022-02-13 DIAGNOSIS — L600 Ingrowing nail: Secondary | ICD-10-CM

## 2022-02-13 DIAGNOSIS — M722 Plantar fascial fibromatosis: Secondary | ICD-10-CM | POA: Diagnosis not present

## 2022-02-15 NOTE — Progress Notes (Signed)
Subjective:   Patient ID: Danielle Howard, female   DOB: 71 y.o.   MRN: [id number]   HPI Patient presents stating she has noticed color changes in her right second toe recently and she just wanted it checked because she has diabetes   ROS      Objective:  Physical Exam  Neurovascular status intact history of ingrown toenail correction doing well with slight discoloration of the second digit right that is not extending proximal with no deep discoloration noted      Assessment:  Discoloration of the second digit right does not appear to be gangrenous or anything of significant pathology     Plan:  Reviewed that there can be light discoloration but I do not see this is a big issue currently if it were to darken further or pathology recur she will let us know ingrown toenails evaluated healing well

## 2022-02-17 DIAGNOSIS — I129 Hypertensive chronic kidney disease with stage 1 through stage 4 chronic kidney disease, or unspecified chronic kidney disease: Secondary | ICD-10-CM | POA: Diagnosis not present

## 2022-02-17 DIAGNOSIS — N182 Chronic kidney disease, stage 2 (mild): Secondary | ICD-10-CM | POA: Diagnosis not present

## 2022-02-17 DIAGNOSIS — R809 Proteinuria, unspecified: Secondary | ICD-10-CM | POA: Diagnosis not present

## 2022-02-20 ENCOUNTER — Other Ambulatory Visit: Payer: Self-pay | Admitting: "Endocrinology

## 2022-03-01 IMAGING — DX DG CHEST 2V
2 series · 2 of 2 positions shown · non-contrast
Comparison: 01/07/2018

CLINICAL DATA: Chills.

EXAM:
CHEST - 2 VIEW

[chest pa]
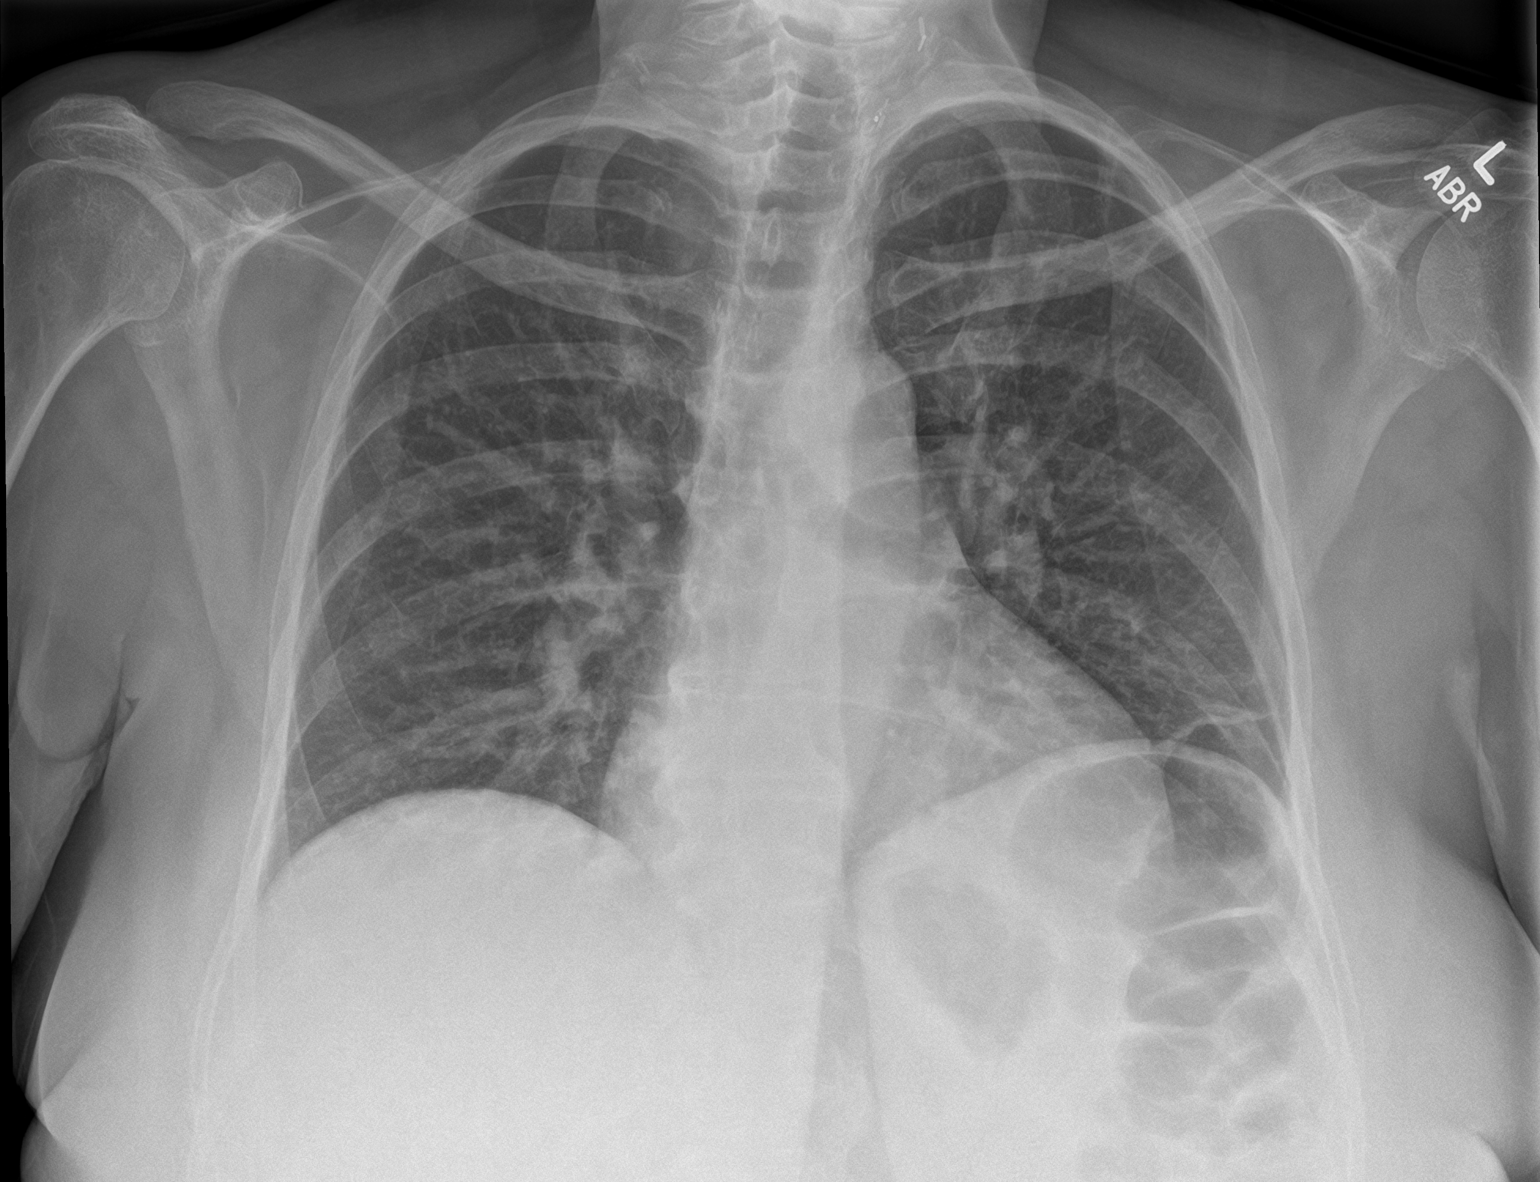

[chest lat]
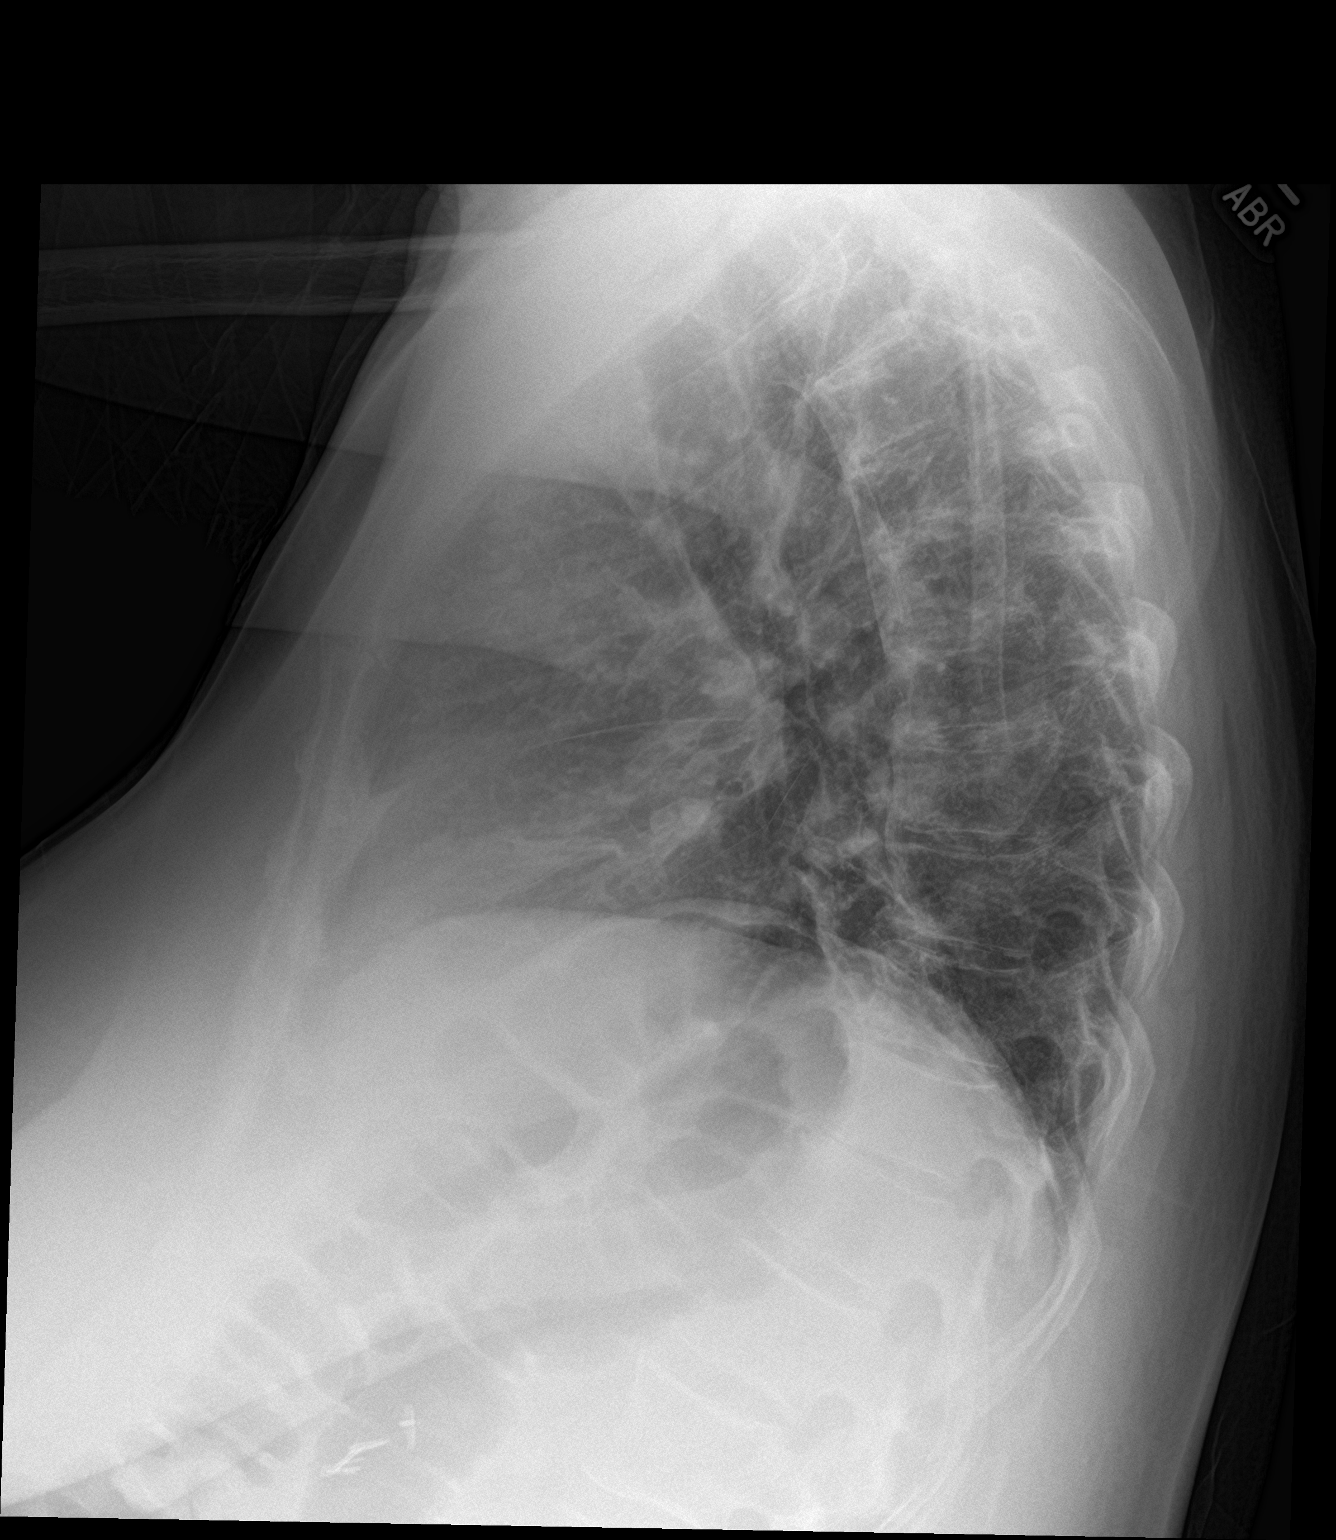

[2 of 2 positions shown; findings below may reference images not displayed]

FINDINGS: The heart size is stable. There is unchanged scarring atelectasis at
the lung bases, left greater than right. There is no pneumothorax or
large pleural effusion. No acute osseous abnormality.
IMPRESSION: No active cardiopulmonary disease.

## 2022-03-05 ENCOUNTER — Ambulatory Visit: Payer: Medicare Other

## 2022-03-05 DIAGNOSIS — M722 Plantar fascial fibromatosis: Secondary | ICD-10-CM

## 2022-03-05 NOTE — Progress Notes (Signed)
Patient presents today to pick up custom molded foot orthotics, diagnosed with plantar fasciitis by Dr. Paulla Dolly.   Orthotics were dispensed and fit was satisfactory. Reviewed instructions for break-in and wear. Written instructions given to patient.  Patient will follow up as needed.   [facility] - order # [id number]

## 2022-04-02 DIAGNOSIS — E890 Postprocedural hypothyroidism: Secondary | ICD-10-CM | POA: Diagnosis not present

## 2022-04-03 LAB — LIPID PANEL
Chol/HDL Ratio: 2.4 ratio (ref 0.0–4.4)
Cholesterol, Total: 205 mg/dL — ABNORMAL HIGH (ref 100–199)
HDL: 84 mg/dL (ref >39)
LDL Chol Calc (NIH): 103 mg/dL — ABNORMAL HIGH (ref 0–99)
Triglycerides: 105 mg/dL (ref 0–149)
VLDL Cholesterol Cal: 18 mg/dL (ref 5–40)

## 2022-04-03 LAB — TSH: TSH: 1.19 u[IU]/mL (ref 0.450–4.500)

## 2022-04-03 LAB — T4, FREE: Free T4: 1.55 ng/dL (ref 0.82–1.77)

## 2022-04-05 DIAGNOSIS — H401112 Primary open-angle glaucoma, right eye, moderate stage: Secondary | ICD-10-CM | POA: Diagnosis not present

## 2022-04-05 DIAGNOSIS — H4042X3 Glaucoma secondary to eye inflammation, left eye, severe stage: Secondary | ICD-10-CM | POA: Diagnosis not present

## 2022-04-09 ENCOUNTER — Ambulatory Visit (INDEPENDENT_AMBULATORY_CARE_PROVIDER_SITE_OTHER): Payer: Medicare Other | Admitting: "Endocrinology

## 2022-04-09 ENCOUNTER — Encounter: Payer: Self-pay | Admitting: "Endocrinology

## 2022-04-09 VITALS — BP 138/72 | HR 88 | Ht 66.0 in | Wt 204.8 lb

## 2022-04-09 DIAGNOSIS — R7303 Prediabetes: Secondary | ICD-10-CM

## 2022-04-09 DIAGNOSIS — E782 Mixed hyperlipidemia: Secondary | ICD-10-CM

## 2022-04-09 DIAGNOSIS — E890 Postprocedural hypothyroidism: Secondary | ICD-10-CM | POA: Diagnosis not present

## 2022-04-09 NOTE — Progress Notes (Signed)
04/09/2022                       Endocrinology Follow Up Note    Subjective:    Patient ID: Danielle Howard, female    DOB: [birth year], PCP Lindell Spar, MD   Past Medical History:  Diagnosis Date   Arthritis    Complication of anesthesia    nausea   Headache    Hypertension    Hypothyroidism    Hypothyroidism    Iritis    Thyroid disease    Uveitis of left eye 12/29/2017   Past Surgical History:  Procedure Laterality Date   ABDOMINAL HYSTERECTOMY     CATARACT EXTRACTION Left 2019   CHOLECYSTECTOMY     02/2012   COLONOSCOPY N/A 05/25/2013   Procedure: COLONOSCOPY;  Surgeon: Rogene Houston, MD;  [facility];  Service: Endoscopy;  Laterality: N/A;  830-moved to 1055 Ann to notify pt   COLONOSCOPY N/A 06/21/2018   Procedure: COLONOSCOPY;  Surgeon: Rogene Houston, MD;  [facility];  Service: Endoscopy;  Laterality: N/A;  1020   EYE SURGERY N/A    Phreesia 07/13/2020   JOINT REPLACEMENT Bilateral    2012,2013-bilateral knees   POLYPECTOMY  06/21/2018   Procedure: POLYPECTOMY;  Surgeon: Rogene Houston, MD;  [facility];  Service: Endoscopy;;   THYROID SURGERY     Social History   Socioeconomic History   Marital status: Single    Spouse name: Not on file   Number of children: Not on file   Years of education: Not on file   Highest education level: Not on file  Occupational History   Not on file  Tobacco Use   Smoking status: Never   Smokeless tobacco: Never  Vaping Use   Vaping Use: Never used  Substance and Sexual Activity   Alcohol use: Yes    Comment: occasional   Drug use: No   Sexual activity: Yes  Other Topics Concern   Not on file  Social History Narrative   Not on file   Social Determinants of Health   Financial Resource Strain: Low Risk  (10/08/2021)   Overall Financial Resource Strain (CARDIA)    Difficulty of Paying Living Expenses: Not hard at all  Food Insecurity: No Food Insecurity (10/08/2021)    Hunger Vital Sign    Worried About Running Out of Food in the Last Year: Never true    Ran Out of Food in the Last Year: Never true  Transportation Needs: No Transportation Needs (10/08/2021)   PRAPARE - Hydrologist (Medical): No    Lack of Transportation (Non-Medical): No  Physical Activity: Insufficiently Active (10/08/2021)   Exercise Vital Sign    Days of Exercise per Week: 3 days    Minutes of Exercise per Session: 30 min  Stress: No Stress Concern Present (10/08/2021)   [place]    Feeling of Stress : Not at all  Social Connections: Moderately Isolated (10/08/2021)   Social Connection and Isolation Panel [NHANES]    Frequency of Communication with Friends and Family: More than three times a week    Frequency of Social Gatherings with Friends and Family: More than three times a week    Attends Religious Services: More than 4 times per year    Active Member of [employer] or Organizations: No    Attends Club or  Organization Meetings: Never    Marital Status: Never married   Outpatient Encounter Medications as of 04/09/2022  Medication Sig   amLODipine (NORVASC) 5 MG tablet Take 5 mg by mouth daily.   brimonidine (ALPHAGAN) 0.15 % ophthalmic solution Place 1 drop into both eyes 3 (three) times daily.   Calcium Carbonate-Vit D-Min (CALCIUM 1200 PO) Take by mouth.   dextromethorphan-guaiFENesin (MUCINEX DM) 30-600 MG 12hr tablet Take 1 tablet by mouth 2 (two) times daily. (Patient not taking: Reported on 04/09/2022)   dorzolamide-timolol (COSOPT) 22.3-6.8 MG/ML ophthalmic solution 2 (two) times daily. Left eye only   folic acid (FOLVITE) 1 MG tablet Take 1 mg by mouth daily.    levothyroxine (SYNTHROID) 150 MCG tablet TAKE 1 TABLET(150 MCG) BY MOUTH DAILY BEFORE BREAKFAST   lisinopril (ZESTRIL) 10 MG tablet TAKE 1 TABLET(10 MG) BY MOUTH DAILY   LUMIGAN 0.01 % SOLN Place 1 drop into the left eye at  bedtime.   Multiple Vitamins-Minerals (MULTIVITAMIN WITH MINERALS) tablet Take 1 tablet by mouth daily.   potassium chloride SA (KLOR-CON M) 20 MEQ tablet TAKE 1 TABLET BY MOUTH EVERY DAY   torsemide (DEMADEX) 20 MG tablet TAKE 1 TO 2 TABLETS BY MOUTH DAILY AS NEEDED FOR SWELLING   [DISCONTINUED] doxycycline (VIBRA-TABS) 100 MG tablet Take 100 mg by mouth 2 (two) times daily.   [DISCONTINUED] ketoconazole (NIZORAL) 2 % cream Apply 1 application  topically daily.   [DISCONTINUED] triamcinolone cream (KENALOG) 0.1 % Apply 1 application  topically 2 (two) times daily.   No facility-administered encounter medications on file as of 04/09/2022.   ALLERGIES: Allergies  Allergen Reactions   Acetazolamide    Dicloxacillin Other (See Comments)    GI upset   VACCINATION STATUS: Immunization History  Administered Date(s) Administered   Fluad Quad(high Dose 65+) 01/13/2019, 01/11/2021   Influenza Whole 02/09/2013   Influenza, High Dose Seasonal PF 01/20/2017, 01/27/2018, 01/04/2020   Influenza,inj,Quad PF,6+ Mos 01/15/2015, 01/25/2016   Influenza-Unspecified 01/13/2014   Moderna SARS-COV2 Booster Vaccination 03/21/2020, 12/11/2020   Moderna Sars-Covid-2 Vaccination 06/16/2019, 07/18/2019   Pneumococcal Conjugate-13 01/15/2015   Pneumococcal Polysaccharide-23 01/13/2014   Tdap 10/02/2009   Varicella 03/03/2010   Zoster Recombinat (Shingrix) 02/09/2021, 04/10/2021    Thyroid Problem Presents for follow-up visit. Patient reports no anxiety, cold intolerance, constipation, depressed mood, diarrhea, fatigue, hair loss, heat intolerance, palpitations, tremors, weight gain or weight loss. The symptoms have been stable.    She is on levothyroxine 150 mcg p.o. daily before breakfast.  She reports compliance and consistency with her hormone.  She denies palpitations, tremors, nor heat intolerance. She reports improvement in her previous symptoms of fatigue.  She presents with fluctuating body weight,  has no new complaints today.   she denies dysphagia, SOB, nor voice change.     Review of systems  Constitutional: + Minimally fluctuating body weight,  current Body mass index is 33.06 kg/m. , no fatigue,  no subjective hypothermia    Objective:    BP 138/72   Pulse 88   Ht '5\' 6"'$  (1.676 m)   Wt 204 lb 12.8 oz (92.9 kg)   BMI 33.06 kg/m   Wt Readings from Last 3 Encounters:  04/09/22 204 lb 12.8 oz (92.9 kg)  11/14/21 208 lb 3.2 oz (94.4 kg)  10/11/21 214 lb (97.1 kg)    BP Readings from Last 3 Encounters:  04/09/22 138/72  11/14/21 118/72  10/11/21 118/72      Physical Exam- Limited  Constitutional:  Body mass index  is 33.06 kg/m. , not in acute distress, normal state of mind     CMP     Component Value Date/Time   NA 144 12/10/2020 1140   K 4.4 12/10/2020 1140   CL 104 12/10/2020 1140   CO2 25 12/10/2020 1140   GLUCOSE 111 (H) 12/10/2020 1140   GLUCOSE 109 (H) 07/05/2020 1928   BUN 32 (H) 12/10/2020 1140   CREATININE 1.33 (H) 12/10/2020 1140   CREATININE 1.28 (H) 02/10/2020 0926   CALCIUM 9.4 12/10/2020 1140   PROT 7.2 12/10/2020 1140   ALBUMIN 4.5 12/10/2020 1140   AST 27 12/10/2020 1140   ALT 17 12/10/2020 1140   ALKPHOS 118 12/10/2020 1140   BILITOT 0.9 12/10/2020 1140   GFRNONAA 23 (L) 07/05/2020 1928   GFRNONAA 41 (L) 03/28/2019 0744   GFRAA 48 (L) 03/28/2019 0744     Diabetic Labs (most recent): Lab Results  Component Value Date   HGBA1C 5.6 10/08/2021   HGBA1C 5.3 02/10/2020   HGBA1C 5.6 08/08/2019   MICROALBUR 3.6 02/10/2020   MICROALBUR 1.1 11/17/2018   MICROALBUR 0.4 08/09/2018     Lipid Panel ( most recent) Lipid Panel     Component Value Date/Time   CHOL 205 (H) 04/02/2022 0931   TRIG 105 04/02/2022 0931   HDL 84 04/02/2022 0931   CHOLHDL 2.4 04/02/2022 0931   CHOLHDL 2.6 02/10/2020 0926   VLDL 17 07/15/2016 0859   LDLCALC 103 (H) 04/02/2022 0931   LDLCALC 88 02/10/2020 0926    Recent Results (from the past 2160  hour(s))  TSH     Status: None   Collection Time: 04/02/22  9:31 AM  Result Value Ref Range   TSH 1.190 0.450 - 4.500 uIU/mL  T4, free     Status: None   Collection Time: 04/02/22  9:31 AM  Result Value Ref Range   Free T4 1.55 0.82 - 1.77 ng/dL  Lipid panel     Status: Abnormal   Collection Time: 04/02/22  9:31 AM  Result Value Ref Range   Cholesterol, Total 205 (H) 100 - 199 mg/dL   Triglycerides 105 0 - 149 mg/dL   HDL 84 >39 mg/dL   VLDL Cholesterol Cal 18 5 - 40 mg/dL   LDL Chol Calc (NIH) 103 (H) 0 - 99 mg/dL   Chol/HDL Ratio 2.4 0.0 - 4.4 ratio    Comment:                                   T. Chol/HDL Ratio                                             Men  Women                               1/2 Avg.Risk  3.4    3.3                                   Avg.Risk  5.0    4.4  2X Avg.Risk  9.6    7.1                                3X Avg.Risk 23.4   11.0        Assessment & Plan:   1. 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  She is advised to continue levothyroxine 150 mcg p.o. daily before breakfast.     - We discussed about the correct intake of her thyroid hormone, on empty stomach at fasting, with water, separated by at least 30 minutes from breakfast and other medications,  and separated by more than 4 hours from calcium, iron, multivitamins, acid reflux medications (PPIs). -Patient is made aware of the fact that thyroid hormone replacement is needed for life, dose to be adjusted by periodic monitoring of thyroid function tests.  For her prediabetes, no medication intervention is necessary at this time.  She presents with some weight loss.  Her most recent A1c was 5.6%.  She has hyperlipidemia with LDL of 103, not on treatment.  She is advised on whole food plant-based diet. Her last thyroid u/s from 01/2017 showed normal size right  lobe with no nodules.- no need for additional follow up at this time.   - I advised patient to maintain close follow up with Lindell Spar, MD for primary care needs.   I spent 26 minutes in the care of the patient today including review of labs from Thyroid Function, CMP, and other relevant labs ; imaging/biopsy records (current and previous including abstractions from other facilities); face-to-face time discussing  her lab results and symptoms, medications doses, her options of short and long term treatment based on the latest standards of care / guidelines;   and documenting the encounter.  Ruthel Martine Yanke  participated in the discussions, expressed understanding, and voiced agreement with the above plans.  All questions were answered to her satisfaction. she is encouraged to contact clinic should she have any questions or concerns prior to her return visit.   Follow up plan: Return in about 6 months (around 10/09/2022) for A1c -NV, Fasting Labs  in AM B4 8.  Rayetta Pigg, [facility] [address] [place], [place] [address] Phone: [phone] Fax: [phone]   04/09/2022, 6:07 PM

## 2022-04-25 DIAGNOSIS — H31002 Unspecified chorioretinal scars, left eye: Secondary | ICD-10-CM | POA: Diagnosis not present

## 2022-04-25 DIAGNOSIS — H401123 Primary open-angle glaucoma, left eye, severe stage: Secondary | ICD-10-CM | POA: Diagnosis not present

## 2022-04-25 DIAGNOSIS — H25011 Cortical age-related cataract, right eye: Secondary | ICD-10-CM | POA: Diagnosis not present

## 2022-04-25 DIAGNOSIS — H524 Presbyopia: Secondary | ICD-10-CM | POA: Diagnosis not present

## 2022-04-25 DIAGNOSIS — H2511 Age-related nuclear cataract, right eye: Secondary | ICD-10-CM | POA: Diagnosis not present

## 2022-05-06 ENCOUNTER — Other Ambulatory Visit: Payer: Self-pay | Admitting: Internal Medicine

## 2022-05-19 ENCOUNTER — Encounter: Payer: Self-pay | Admitting: Internal Medicine

## 2022-05-19 ENCOUNTER — Ambulatory Visit (INDEPENDENT_AMBULATORY_CARE_PROVIDER_SITE_OTHER): Payer: Medicare Other | Admitting: Internal Medicine

## 2022-05-19 VITALS — BP 107/70 | HR 88 | Ht 66.0 in | Wt 202.6 lb

## 2022-05-19 DIAGNOSIS — I10 Essential (primary) hypertension: Secondary | ICD-10-CM | POA: Diagnosis not present

## 2022-05-19 DIAGNOSIS — E669 Obesity, unspecified: Secondary | ICD-10-CM | POA: Diagnosis not present

## 2022-05-19 DIAGNOSIS — M542 Cervicalgia: Secondary | ICD-10-CM | POA: Diagnosis not present

## 2022-05-19 DIAGNOSIS — N1831 Chronic kidney disease, stage 3a: Secondary | ICD-10-CM | POA: Diagnosis not present

## 2022-05-19 DIAGNOSIS — E890 Postprocedural hypothyroidism: Secondary | ICD-10-CM

## 2022-05-19 DIAGNOSIS — R7303 Prediabetes: Secondary | ICD-10-CM | POA: Diagnosis not present

## 2022-05-19 DIAGNOSIS — E782 Mixed hyperlipidemia: Secondary | ICD-10-CM

## 2022-05-19 DIAGNOSIS — I739 Peripheral vascular disease, unspecified: Secondary | ICD-10-CM

## 2022-05-19 MED ORDER — BACLOFEN 10 MG PO TABS: 10.0000 mg | ORAL_TABLET | Freq: Two times a day (BID) | ORAL | 0 refills | Status: DC | PRN

## 2022-05-19 NOTE — Patient Instructions (Addendum)
Please take Baclofen as needed for neck muscle stiffness.  Please continue taking medications as prescribed.  Please continue to follow low salt diet and ambulate as tolerated.

## 2022-05-21 ENCOUNTER — Telehealth: Payer: Self-pay | Admitting: Internal Medicine

## 2022-05-21 ENCOUNTER — Other Ambulatory Visit: Payer: Self-pay | Admitting: Internal Medicine

## 2022-05-21 DIAGNOSIS — M542 Cervicalgia: Secondary | ICD-10-CM | POA: Insufficient documentation

## 2022-05-21 MED ORDER — CYCLOBENZAPRINE HCL 5 MG PO TABS: 5.0000 mg | ORAL_TABLET | Freq: Two times a day (BID) | ORAL | 1 refills | Status: DC | PRN

## 2022-05-21 NOTE — Assessment & Plan Note (Signed)
Diet modification and activity as tolerated

## 2022-05-21 NOTE — Assessment & Plan Note (Signed)
Lab Results  Component Value Date   HGBA1C 5.6 10/08/2021   Advised to follow low-carb diet for now

## 2022-05-21 NOTE — Telephone Encounter (Signed)
Patient advised.

## 2022-05-21 NOTE — Telephone Encounter (Signed)
Patient called said this medicine makes her sick throwing up and diarrhea with medication  baclofen (LIORESAL) 10 MG tablet [[id number]]   Can something else be called into her [place], [address] [place], [place] [address] Phone: [phone]  Fax: [phone]

## 2022-05-21 NOTE — Progress Notes (Signed)
Established Patient Office Visit  Subjective:  Patient ID: Danielle Howard, female    DOB: 1951/04/24  Age: 72 y.o. MRN: [id number]  CC:  Chief Complaint  Patient presents with   Neck Pain    Patient states she is having neck and shoulder pain, has trouble turning neck from side to side    HPI [name] is a 72 y.o. female with past medical history of HTN, PVD, hypothyroidism, gluacoma, uveitis and obesity who presents for f/u of her HTN.  HTN: Her BP is well controlled today.  She takes Lasix lisinopril 10 mg daily and amlodipine 5 mg daily.  She denies any headache, dizziness, chest pain, dyspnea or palpitations.  CKD: Her GFR has improved to 61 now.  She has stopped taking cyclosporine, which was prescribed by her ophthalmologist for uveitis.  She denies any dysuria, hematuria or urinary hesitancy or resistance.  Hypothyroidism: She takes levothyroxine 150 mcg QD. Followed by Dr. Dorris Fetch.  She complains of pain around left side of the neck and left shoulder.  Her pain is dull, intermittent, worse in the morning, and is nonradiating.  She admits that she sleeps on the recliner chair sometimes while watching TV.  Denies any numbness or tingling of the UE.   Past Medical History:  Diagnosis Date   Arthritis    Complication of anesthesia    nausea   Headache    Hypertension    Hypothyroidism    Hypothyroidism    Iritis    Thyroid disease    Uveitis of left eye 12/29/2017    Past Surgical History:  Procedure Laterality Date   ABDOMINAL HYSTERECTOMY     CATARACT EXTRACTION Left 2019   CHOLECYSTECTOMY     02/2012   COLONOSCOPY N/A 05/25/2013   Procedure: COLONOSCOPY;  Surgeon: Rogene [place], MD;  [facility];  Service: Endoscopy;  Laterality: N/A;  830-moved to 1055 Ann to notify pt   COLONOSCOPY N/A 06/21/2018   Procedure: COLONOSCOPY;  Surgeon: Rogene [place], MD;  [facility]: [facility];  Service: Endoscopy;  Laterality: N/A;  1020   EYE SURGERY N/A     [employer] 07/13/2020   JOINT REPLACEMENT Bilateral    2012,2013-bilateral knees   POLYPECTOMY  06/21/2018   Procedure: POLYPECTOMY;  Surgeon: Rogene [place], MD;  [facility]: [facility];  Service: Endoscopy;;   THYROID SURGERY      Family History  Problem Relation Age of Onset   Arthritis Mother    Heart disease Mother    Hypertension Mother    Arthritis Father    Hyperlipidemia Father    HIV/AIDS Brother     Social History   Socioeconomic History   Marital status: Single    Spouse name: Not on file   Number of children: Not on file   Years of education: Not on file   Highest education level: Not on file  Occupational History   Not on file  Tobacco Use   Smoking status: Never   Smokeless tobacco: Never  Vaping Use   Vaping Use: Never used  Substance and Sexual Activity   Alcohol use: Yes    Comment: occasional   Drug use: No   Sexual activity: Yes  Other Topics Concern   Not on file  Social History Narrative   Not on file   Social Determinants of Health   Financial Resource Strain: Low Risk  (10/08/2021)   Overall Financial Resource Strain (CARDIA)    Difficulty of  Paying Living Expenses: Not hard at all  Food Insecurity: No Food Insecurity (10/08/2021)   Hunger Vital Sign    Worried About Running Out of Food in the Last Year: Never true    Ran Out of Food in the Last Year: Never true  Transportation Needs: No Transportation Needs (10/08/2021)   PRAPARE - Hydrologist (Medical): No    Lack of Transportation (Non-Medical): No  Physical Activity: Insufficiently Active (10/08/2021)   Exercise Vital Sign    Days of Exercise per Week: 3 days    Minutes of Exercise per Session: 30 min  Stress: No Stress Concern Present (10/08/2021)   [place]    Feeling of Stress : Not at all  Social Connections: Moderately Isolated (10/08/2021)   Social Connection and Isolation Panel  [NHANES]    Frequency of Communication with Friends and Family: More than three times a week    Frequency of Social Gatherings with Friends and Family: More than three times a week    Attends Religious Services: More than 4 times per year    Active Member of [employer] or Organizations: No    Attends Archivist Meetings: Never    Marital Status: Never married  Intimate Partner Violence: Not At Risk (10/08/2021)   Humiliation, Afraid, Rape, and Kick questionnaire    Fear of Current or Ex-Partner: No    Emotionally Abused: No    Physically Abused: No    Sexually Abused: No    Outpatient Medications Prior to Visit  Medication Sig Dispense Refill   latanoprost (XALATAN) 0.005 % ophthalmic solution Place 1 drop into the left eye at bedtime.     amLODipine (NORVASC) 5 MG tablet Take 5 mg by mouth daily.     brimonidine (ALPHAGAN) 0.15 % ophthalmic solution Place 1 drop into both eyes 3 (three) times daily. 5 mL 12   Calcium Carbonate-Vit D-Min (CALCIUM 1200 PO) Take by mouth.     dextromethorphan-guaiFENesin (MUCINEX DM) 30-600 MG 12hr tablet Take 1 tablet by mouth 2 (two) times daily. (Patient not taking: Reported on 04/09/2022)     dorzolamide (TRUSOPT) 2 % ophthalmic solution Place 1 drop into the left eye 2 (two) times daily.     dorzolamide-timolol (COSOPT) 22.3-6.8 MG/ML ophthalmic solution 2 (two) times daily. Left eye only     folic acid (FOLVITE) 1 MG tablet Take 1 mg by mouth daily.      levothyroxine (SYNTHROID) 150 MCG tablet TAKE 1 TABLET(150 MCG) BY MOUTH DAILY BEFORE BREAKFAST 90 tablet 0   lisinopril (ZESTRIL) 10 MG tablet TAKE 1 TABLET(10 MG) BY MOUTH DAILY 90 tablet 2   LUMIGAN 0.01 % SOLN Place 1 drop into the left eye at bedtime.     Multiple Vitamins-Minerals (MULTIVITAMIN WITH MINERALS) tablet Take 1 tablet by mouth daily.     potassium chloride SA (KLOR-CON M) 20 MEQ tablet TAKE 1 TABLET BY MOUTH EVERY DAY 90 tablet 0   timolol (TIMOPTIC) 0.5 % ophthalmic solution 1  drop 2 (two) times daily.     torsemide (DEMADEX) 20 MG tablet TAKE 1 TO 2 TABLETS BY MOUTH DAILY AS NEEDED FOR SWELLING 180 tablet 2   No facility-administered medications prior to visit.    Allergies  Allergen Reactions   Acetazolamide    Dicloxacillin Other (See Comments)    GI upset    ROS Review of Systems  Constitutional:  Negative for chills and fever.  HENT:  Negative for congestion, sinus pressure, sinus pain and sore throat.   Eyes:  Negative for pain and discharge.  Respiratory:  Negative for cough and shortness of breath.   Cardiovascular:  Positive for leg swelling. Negative for chest pain and palpitations.  Gastrointestinal:  Negative for abdominal pain, constipation, diarrhea, nausea and vomiting.  Endocrine: Negative for polydipsia and polyuria.  Genitourinary:  Negative for dysuria and hematuria.  Musculoskeletal:  Positive for neck pain. Negative for neck stiffness.  Skin:  Negative for rash.  Neurological:  Negative for dizziness and weakness.  Psychiatric/Behavioral:  Negative for agitation and behavioral problems.       Objective:    Physical Exam Vitals reviewed.  Constitutional:      General: She is not in acute distress.    Appearance: She is not diaphoretic.  HENT:     Head: Normocephalic and atraumatic.     Nose: Nose normal.     Mouth/Throat:     Mouth: Mucous membranes are moist.  Eyes:     General: No scleral icterus.    Extraocular Movements: Extraocular movements intact.  Cardiovascular:     Rate and Rhythm: Normal rate and regular rhythm.     Pulses: Normal pulses.     Heart sounds: Normal heart sounds. No murmur heard. Pulmonary:     Breath sounds: Normal breath sounds. No wheezing or rales.  Musculoskeletal:     Cervical back: Neck supple. Tenderness present.     Right lower leg: Edema (1+) present.     Left lower leg: Edema (1+) present.  Skin:    General: Skin is warm.     Findings: No rash.  Neurological:     General: No  focal deficit present.     Mental Status: She is alert and oriented to person, place, and time.  Psychiatric:        Mood and Affect: Mood normal.        Behavior: Behavior normal.     BP 107/70 (BP Location: Right Arm, Patient Position: Sitting, Cuff Size: Large)   Pulse 88   Ht '5\' 6"'$  (1.676 m)   Wt 202 lb 9.6 oz (91.9 kg)   SpO2 96%   BMI 32.70 kg/m  Wt Readings from Last 3 Encounters:  05/19/22 202 lb 9.6 oz (91.9 kg)  04/09/22 204 lb 12.8 oz (92.9 kg)  11/14/21 208 lb 3.2 oz (94.4 kg)    Lab Results  Component Value Date   TSH 1.190 04/02/2022   Lab Results  Component Value Date   WBC 4.3 12/10/2020   HGB 12.5 12/10/2020   HCT 38.3 12/10/2020   MCV 99 (H) 12/10/2020   PLT 310 12/10/2020   Lab Results  Component Value Date   NA 144 12/10/2020   K 4.4 12/10/2020   CO2 25 12/10/2020   GLUCOSE 111 (H) 12/10/2020   BUN 32 (H) 12/10/2020   CREATININE 1.33 (H) 12/10/2020   BILITOT 0.9 12/10/2020   ALKPHOS 118 12/10/2020   AST 27 12/10/2020   ALT 17 12/10/2020   PROT 7.2 12/10/2020   ALBUMIN 4.5 12/10/2020   CALCIUM 9.4 12/10/2020   ANIONGAP 12 07/05/2020   EGFR 43 (L) 12/10/2020   Lab Results  Component Value Date   CHOL 205 (H) 04/02/2022   Lab Results  Component Value Date   HDL 84 04/02/2022   Lab Results  Component Value Date   LDLCALC 103 (H) 04/02/2022   Lab Results  Component Value Date   TRIG 105  04/02/2022   Lab Results  Component Value Date   CHOLHDL 2.4 04/02/2022   Lab Results  Component Value Date   HGBA1C 5.6 10/08/2021      Assessment & Plan:   Problem List Items Addressed This Visit       Cardiovascular and Mediastinum   Essential hypertension, benign - Primary    BP Readings from Last 1 Encounters:  05/19/22 107/70  Well-controlled with lisinopril and amlodipine now Followed by Nephrology Counseled for compliance with the medications Advised DASH diet and moderate exercise/walking as tolerated      PVD  (peripheral vascular disease) ([place])    Likely contributing to leg swelling Takes Demadex PRN        Endocrine   Hypothyroidism    On Levothyroxine 150 mcg QD Follows up with Endocrinology - Dr Dorris Fetch        Genitourinary   Chronic kidney disease (CKD), stage III (moderate) ([place])    Last BMP reviewed - GFR improved to 61 She has stopped taking soft drinks cyclosporine and acyclovir discontinued Advised to maintain proper hydration Avoid nephrotoxic agents Followed by nephrology        Other   Obesity (BMI 30-39.9)    Diet modification and activity as tolerated      Prediabetes    Lab Results  Component Value Date   HGBA1C 5.6 10/08/2021  Advised to follow low-carb diet for now      Mixed hyperlipidemia    Lipid profile reviewed Advised to follow DASH diet for now      Neck pain    Likely due to trapezius strain from sleeping posture and/or DDD of cervical spine Heating pad as needed Baclofen as needed for neck muscle stiffness      Relevant Medications   baclofen (LIORESAL) 10 MG tablet    Meds ordered this encounter  Medications   baclofen (LIORESAL) 10 MG tablet    Sig: Take 1 tablet (10 mg total) by mouth 2 (two) times daily as needed for muscle spasms (Neck pain).    Dispense:  30 each    Refill:  0     Follow-up: Return in about 6 months (around 11/17/2022).    Lindell Spar, MD

## 2022-05-21 NOTE — Assessment & Plan Note (Signed)
Last BMP reviewed - GFR improved to 61 She has stopped taking soft drinks cyclosporine and acyclovir discontinued Advised to maintain proper hydration Avoid nephrotoxic agents Followed by nephrology

## 2022-05-21 NOTE — Assessment & Plan Note (Signed)
Likely due to trapezius strain from sleeping posture and/or DDD of cervical spine Heating pad as needed Baclofen as needed for neck muscle stiffness

## 2022-05-21 NOTE — Assessment & Plan Note (Signed)
Lipid profile reviewed Advised to follow DASH diet for now

## 2022-05-21 NOTE — Assessment & Plan Note (Signed)
BP Readings from Last 1 Encounters:  05/19/22 107/70   Well-controlled with lisinopril and amlodipine now Followed by Nephrology Counseled for compliance with the medications Advised DASH diet and moderate exercise/walking as tolerated

## 2022-05-21 NOTE — Assessment & Plan Note (Signed)
Likely contributing to leg swelling Takes Demadex PRN

## 2022-05-21 NOTE — Assessment & Plan Note (Signed)
On Levothyroxine 150 mcg QD Follows up with Endocrinology - Dr Dorris Fetch

## 2022-05-28 ENCOUNTER — Other Ambulatory Visit: Payer: Self-pay | Admitting: "Endocrinology

## 2022-06-12 DIAGNOSIS — H2511 Age-related nuclear cataract, right eye: Secondary | ICD-10-CM | POA: Diagnosis not present

## 2022-06-12 DIAGNOSIS — H25041 Posterior subcapsular polar age-related cataract, right eye: Secondary | ICD-10-CM | POA: Diagnosis not present

## 2022-06-12 DIAGNOSIS — H25011 Cortical age-related cataract, right eye: Secondary | ICD-10-CM | POA: Diagnosis not present

## 2022-06-12 DIAGNOSIS — H25811 Combined forms of age-related cataract, right eye: Secondary | ICD-10-CM | POA: Diagnosis not present

## 2022-06-12 DIAGNOSIS — H269 Unspecified cataract: Secondary | ICD-10-CM | POA: Diagnosis not present

## 2022-06-12 HISTORY — PX: CATARACT EXTRACTION: SUR2

## 2022-06-21 ENCOUNTER — Other Ambulatory Visit: Payer: Self-pay | Admitting: Internal Medicine

## 2022-06-21 DIAGNOSIS — I10 Essential (primary) hypertension: Secondary | ICD-10-CM

## 2022-07-31 DIAGNOSIS — Z1283 Encounter for screening for malignant neoplasm of skin: Secondary | ICD-10-CM | POA: Diagnosis not present

## 2022-07-31 DIAGNOSIS — D239 Other benign neoplasm of skin, unspecified: Secondary | ICD-10-CM | POA: Diagnosis not present

## 2022-08-05 DIAGNOSIS — M25512 Pain in left shoulder: Secondary | ICD-10-CM | POA: Diagnosis not present

## 2022-08-06 DIAGNOSIS — R809 Proteinuria, unspecified: Secondary | ICD-10-CM | POA: Diagnosis not present

## 2022-08-06 DIAGNOSIS — I129 Hypertensive chronic kidney disease with stage 1 through stage 4 chronic kidney disease, or unspecified chronic kidney disease: Secondary | ICD-10-CM | POA: Diagnosis not present

## 2022-08-06 DIAGNOSIS — N182 Chronic kidney disease, stage 2 (mild): Secondary | ICD-10-CM | POA: Diagnosis not present

## 2022-08-07 DIAGNOSIS — I129 Hypertensive chronic kidney disease with stage 1 through stage 4 chronic kidney disease, or unspecified chronic kidney disease: Secondary | ICD-10-CM | POA: Diagnosis not present

## 2022-08-07 DIAGNOSIS — R809 Proteinuria, unspecified: Secondary | ICD-10-CM | POA: Diagnosis not present

## 2022-08-07 DIAGNOSIS — N182 Chronic kidney disease, stage 2 (mild): Secondary | ICD-10-CM | POA: Diagnosis not present

## 2022-08-11 ENCOUNTER — Other Ambulatory Visit: Payer: Self-pay | Admitting: Internal Medicine

## 2022-08-19 DIAGNOSIS — M25512 Pain in left shoulder: Secondary | ICD-10-CM | POA: Diagnosis not present

## 2022-08-21 DIAGNOSIS — L720 Epidermal cyst: Secondary | ICD-10-CM | POA: Diagnosis not present

## 2022-08-21 DIAGNOSIS — D485 Neoplasm of uncertain behavior of skin: Secondary | ICD-10-CM | POA: Diagnosis not present

## 2022-08-21 DIAGNOSIS — L988 Other specified disorders of the skin and subcutaneous tissue: Secondary | ICD-10-CM | POA: Diagnosis not present

## 2022-08-21 HISTORY — PX: CYST EXCISION: SHX5701

## 2022-08-22 ENCOUNTER — Encounter (HOSPITAL_COMMUNITY): Payer: Self-pay | Admitting: Occupational Therapy

## 2022-08-22 ENCOUNTER — Other Ambulatory Visit: Payer: Self-pay

## 2022-08-22 ENCOUNTER — Ambulatory Visit (HOSPITAL_COMMUNITY): Payer: Medicare Other | Attending: Orthopedic Surgery | Admitting: Occupational Therapy

## 2022-08-22 DIAGNOSIS — M25512 Pain in left shoulder: Secondary | ICD-10-CM | POA: Insufficient documentation

## 2022-08-22 DIAGNOSIS — R29898 Other symptoms and signs involving the musculoskeletal system: Secondary | ICD-10-CM | POA: Insufficient documentation

## 2022-08-22 DIAGNOSIS — M25612 Stiffness of left shoulder, not elsewhere classified: Secondary | ICD-10-CM | POA: Diagnosis not present

## 2022-08-22 NOTE — Patient Instructions (Signed)

## 2022-08-22 NOTE — Therapy (Signed)
OUTPATIENT OCCUPATIONAL THERAPY ORTHO EVALUATION  Patient Name: Danielle Howard MRN: [id number] DOB:10-29-1950, 72 y.o., female Today's Date: 08/22/2022  PCP: Trena Platt, MD REFERRING PROVIDER: Wilfred Lacy, MD  END OF SESSION:  OT End of Session - 08/22/22 1035     Visit Number 1    Number of Visits 9    Date for OT Re-Evaluation 09/26/22    Authorization Type Medicare A and B, 2nd [employer]    Progress Note Due on Visit 10    OT Start Time 1032    OT Stop Time 1115    OT Time Calculation (min) 43 min    Activity Tolerance Patient tolerated treatment well    Behavior During Therapy [facility] for tasks assessed/performed             Past Medical History:  Diagnosis Date   Arthritis    Complication of anesthesia    nausea   Headache    Hypertension    Hypothyroidism    Hypothyroidism    Iritis    Thyroid disease    Uveitis of left eye 12/29/2017   Past Surgical History:  Procedure Laterality Date   ABDOMINAL HYSTERECTOMY     CATARACT EXTRACTION Left 2019   CHOLECYSTECTOMY     02/2012   COLONOSCOPY N/A 05/25/2013   Procedure: COLONOSCOPY;  Surgeon: Malissa Hippo, MD;  [facility];  Service: Endoscopy;  Laterality: N/A;  830-moved to 1055 Ann to notify pt   COLONOSCOPY N/A 06/21/2018   Procedure: COLONOSCOPY;  Surgeon: Malissa Hippo, MD;  [facility];  Service: Endoscopy;  Laterality: N/A;  1020   EYE SURGERY N/A    [facility] 07/13/2020   JOINT REPLACEMENT Bilateral    2012,2013-bilateral knees   POLYPECTOMY  06/21/2018   Procedure: POLYPECTOMY;  Surgeon: Malissa Hippo, MD;  [facility];  Service: Endoscopy;;   THYROID SURGERY     Patient Active Problem List   Diagnosis Date Noted   Neck pain 05/21/2022   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   Candidiasis, intertrigo 09/11/2021   Lymphedema 12/10/2020   Lipoma of right upper extremity 12/10/2020   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, severe  stage 07/04/2020   Chronic kidney disease (CKD), stage III (moderate) 04/04/2019   Gout 10/18/2018   Mixed hyperlipidemia 07/30/2018   Hx of colonic polyps 05/20/2018   Acute iritis, left eye 04/07/2018   Macular pucker, left eye 12/29/2017   Nuclear sclerotic cataract of right eye 12/29/2017   Uveitis 12/29/2017   Hypertensive retinopathy 12/29/2017   PVD (peripheral vascular disease) 11/24/2016   Osteoarthritis, hand 10/01/2016   Hypothyroidism 01/23/2016   Prediabetes 03/07/2013   Hashimoto's thyroiditis 01/03/2013   Obesity (BMI 30-39.9) 01/03/2013   Essential hypertension, benign 01/03/2013   Unilateral primary osteoarthritis, right knee 08/11/2011    ONSET DATE: ~2 months  REFERRING DIAG: Left shoulder pain  THERAPY DIAG:  Acute pain of left shoulder  Stiffness of left shoulder, not elsewhere classified  Other symptoms and signs involving the musculoskeletal system  Rationale for Evaluation and Treatment: Rehabilitation  SUBJECTIVE:   SUBJECTIVE STATEMENT: "It is not a good thing when you can't wash your own body." Pt accompanied by: self  PERTINENT HISTORY: Pt reports taking care of her bed bound father for over a year with shoulder pain building in the last few months. She has had 1 injection that lasted approximately 2 weeks. Xray was negative.   PRECAUTIONS: None  WEIGHT  BEARING RESTRICTIONS: No  PAIN:  Are you having pain? Yes: NPRS scale: 6/10 Pain location: Anterior shoulder girdle Pain description: aching Aggravating factors: movement Relieving factors: tylenol  FALLS: Has patient fallen in last 6 months? Yes. Number of falls "several"  LIVING ENVIRONMENT: Lives with: lives alone Lives in: House/apartment  PLOF: Independent  PATIENT GOALS: To reduce pain.  NEXT MD VISIT: May 14th  OBJECTIVE:   HAND DOMINANCE: Left  ADLs: Overall ADLs: Pt reports pain with dressing and difficulty cleaning her self, especially reaching across to the R  side of her body due to LUE limitations and pain. Pt is unable to lift "heavy" items which limits her ability to cook and lift the pots and pans, as well as certain ingredients. At this time she states that she limits cleaning due to the pain and difficulty she has pushing and pulling items.   FUNCTIONAL OUTCOME MEASURES: FOTO: 57.32  UPPER EXTREMITY ROM:     Active ROM Left eval  Shoulder flexion 128  Shoulder abduction 94  Shoulder internal rotation 90  Shoulder external rotation 25  (Blank rows = not tested)  UPPER EXTREMITY MMT:     MMT Left eval  Shoulder flexion 3+/5  Shoulder abduction 4-/5  Shoulder adduction 4/5  Shoulder extension 4-/5  Shoulder internal rotation 4+/5  Shoulder external rotation 4-/5  (Blank rows = not tested)  SENSATION: Pt reports some numbness and tingling in the left hand  EDEMA: No noted swelling  OBSERVATIONS: Moderate fascial restrictions along the biceps, trapezius, and axillary region   TODAY'S TREATMENT:                                                                                                                              DATE: 08/22/22: Evaluation Only    PATIENT EDUCATION: Education details: Table Slides and Wall Slides Person educated: Patient Education method: Explanation, Demonstration, and Handouts Education comprehension: verbalized understanding and returned demonstration  HOME EXERCISE PROGRAM: 4/19: Table Slides and Wall Slides  GOALS: Goals reviewed with patient? Yes  SHORT TERM GOALS: Target date: 09/26/22  Pt will be provided and educated on HEP for LUE mobility for ADL completion  Goal status: INITIAL  2.  Pt will decrease pain in LUE to 3/10 or less in order to sleep for 3+ consecutive hours without waking due to pain.   Goal status: INITIAL  3.  Pt will decrease fascial restrictions in the LUE to minimal amounts or less in order to complete overhead reaching tasks.   Goal status: INITIAL  4.  Pt  will increase LUE A/ROM to [facility] in order to reach overhead and behind back during dressing and bathing tasks.   Goal status: INITIAL  5.  Pt will increase strength to 4+/5 in LUE, in order to complete moderately heavy lifting tasks when cooking and cleaning.  Goal status: INITIAL    ASSESSMENT:  CLINICAL IMPRESSION: Patient is a 72 y.o. female who was seen  today for occupational therapy evaluation for L shoulder pain. She reports that she has been having difficulty with dressing and bathing due to pain, and is unable to tolerate lifting anything heavier than 2-3 lbs without her arm giving out.    PERFORMANCE DEFICITS: in functional skills including ADLs, IADLs, ROM, strength, pain, fascial restrictions, Gross motor control, body mechanics, and UE functional use.  IMPAIRMENTS: are limiting patient from ADLs, IADLs, rest and sleep, leisure, and social participation.   COMORBIDITIES: has no other co-morbidities that affects occupational performance. Patient will benefit from skilled OT to address above impairments and improve overall function.  MODIFICATION OR ASSISTANCE TO COMPLETE EVALUATION: No modification of tasks or assist necessary to complete an evaluation.  OT OCCUPATIONAL PROFILE AND HISTORY: Problem focused assessment: Including review of records relating to presenting problem.  CLINICAL DECISION MAKING: LOW - limited treatment options, no task modification necessary  REHAB POTENTIAL: Good  EVALUATION COMPLEXITY: Low      PLAN:  OT FREQUENCY: 2x/week  OT DURATION: 4 weeks  PLANNED INTERVENTIONS: self care/ADL training, therapeutic exercise, therapeutic activity, manual therapy, passive range of motion, electrical stimulation, moist heat, cryotherapy, patient/family education, coping strategies training, and DME and/or AE instructions  RECOMMENDED OTHER SERVICES: N/A  CONSULTED AND AGREED WITH PLAN OF CARE: Patient  PLAN FOR NEXT SESSION: Manual Therapy as needed,  P/ROM, AA/ROM, A/ROM, Proximal shoulder exercises   Trish Mage, OTR/L [facility] [phone] Gracy Ehly Rosemarie Beath, OT 08/22/2022, 10:37 AM

## 2022-08-29 ENCOUNTER — Encounter (HOSPITAL_COMMUNITY): Payer: Self-pay | Admitting: Occupational Therapy

## 2022-08-29 ENCOUNTER — Ambulatory Visit (HOSPITAL_COMMUNITY): Payer: Medicare Other | Admitting: Occupational Therapy

## 2022-08-29 DIAGNOSIS — R29898 Other symptoms and signs involving the musculoskeletal system: Secondary | ICD-10-CM | POA: Diagnosis not present

## 2022-08-29 DIAGNOSIS — M25612 Stiffness of left shoulder, not elsewhere classified: Secondary | ICD-10-CM | POA: Diagnosis not present

## 2022-08-29 DIAGNOSIS — M25512 Pain in left shoulder: Secondary | ICD-10-CM | POA: Diagnosis not present

## 2022-08-29 NOTE — Patient Instructions (Signed)

## 2022-08-29 NOTE — Therapy (Addendum)
OUTPATIENT OCCUPATIONAL THERAPY ORTHO TREATMENT  Patient Name: LAURREN Howard MRN: [id number] DOB:May 16, 1950, 72 y.o., female Today's Date: 08/29/2022  PCP: Trena Platt, MD REFERRING PROVIDER: Wilfred Lacy, MD  END OF SESSION:  OT End of Session - 08/29/22 1433     Visit Number 2    Number of Visits 9    Date for OT Re-Evaluation 09/26/22    Authorization Type Medicare A and B, [employer]    Authorization - Visit Number 1    Progress Note Due on Visit 10    OT Start Time 1347    OT Stop Time 1429    OT Time Calculation (min) 42 min    Activity Tolerance Patient tolerated treatment well    Behavior During Therapy [facility] for tasks assessed/performed              Past Medical History:  Diagnosis Date   Arthritis    Complication of anesthesia    nausea   Headache    Hypertension    Hypothyroidism    Hypothyroidism    Iritis    Thyroid disease    Uveitis of left eye 12/29/2017   Past Surgical History:  Procedure Laterality Date   ABDOMINAL HYSTERECTOMY     CATARACT EXTRACTION Left 2019   CHOLECYSTECTOMY     02/2012   COLONOSCOPY N/A 05/25/2013   Procedure: COLONOSCOPY;  Surgeon: Malissa Hippo, MD;  [facility];  Service: Endoscopy;  Laterality: N/A;  830-moved to 1055 Ann to notify pt   COLONOSCOPY N/A 06/21/2018   Procedure: COLONOSCOPY;  Surgeon: Malissa Hippo, MD;  [facility];  Service: Endoscopy;  Laterality: N/A;  1020   EYE SURGERY N/A    [employer] 07/13/2020   JOINT REPLACEMENT Bilateral    2012,2013-bilateral knees   POLYPECTOMY  06/21/2018   Procedure: POLYPECTOMY;  Surgeon: Malissa Hippo, MD;  [facility]: [facility];  Service: Endoscopy;;   THYROID SURGERY     Patient Active Problem List   Diagnosis Date Noted   Neck pain 05/21/2022   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   Candidiasis, intertrigo 09/11/2021   Lymphedema 12/10/2020   Lipoma of right upper extremity 12/10/2020   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, severe stage 07/04/2020   Chronic kidney disease (CKD), stage III (moderate) (HCC) 04/04/2019   Gout 10/18/2018   Mixed hyperlipidemia 07/30/2018   Hx of colonic polyps 05/20/2018   Acute iritis, left eye 04/07/2018   Macular pucker, left eye 12/29/2017   Nuclear sclerotic cataract of right eye 12/29/2017   Uveitis 12/29/2017   Hypertensive retinopathy 12/29/2017   PVD (peripheral vascular disease) (HCC) 11/24/2016   Osteoarthritis, hand 10/01/2016   Hypothyroidism 01/23/2016   Prediabetes 03/07/2013   Hashimoto's thyroiditis 01/03/2013   Obesity (BMI 30-39.9) 01/03/2013   Essential hypertension, benign 01/03/2013   Unilateral primary osteoarthritis, right knee 08/11/2011    ONSET DATE: ~2 months  REFERRING DIAG: Left shoulder pain  THERAPY DIAG:  Acute pain of left shoulder  Stiffness of left shoulder, not elsewhere classified  Other symptoms and signs involving the musculoskeletal system  Rationale for Evaluation and Treatment: Rehabilitation  SUBJECTIVE:   SUBJECTIVE STATEMENT:  My  " I have not really been doing my exercises because I have a lot of stuff on my walls." Pt accompanied by: self  PERTINENT HISTORY: Pt reports taking care of her bed bound father for over a year with shoulder pain building in the last few  months. She has had 1 injection that lasted approximately 2 weeks. Xray was negative.   PRECAUTIONS: None  WEIGHT BEARING RESTRICTIONS: No  PAIN:  Are you having pain? Yes: NPRS scale: 6/10 Pain location: Anterior shoulder girdle Pain description: aching Aggravating factors: movement Relieving factors: tylenol  FALLS: Has patient fallen in last 6 months? Yes. Number of falls "several"  LIVING ENVIRONMENT: Lives with: lives alone Lives in: House/apartment  PLOF: Independent  PATIENT GOALS: To reduce pain.  NEXT MD VISIT: May 14th  OBJECTIVE:   HAND DOMINANCE: Left  ADLs: Overall ADLs: Pt reports pain  with dressing and difficulty cleaning her self, especially reaching across to the R side of her body due to LUE limitations and pain. Pt is unable to lift "heavy" items which limits her ability to cook and lift the pots and pans, as well as certain ingredients. At this time she states that she limits cleaning due to the pain and difficulty she has pushing and pulling items.   FUNCTIONAL OUTCOME MEASURES: FOTO: 57.32  UPPER EXTREMITY ROM:     Active ROM Left eval  Shoulder flexion 128  Shoulder abduction 94  Shoulder internal rotation 90  Shoulder external rotation 25  (Blank rows = not tested)  UPPER EXTREMITY MMT:     MMT Left eval  Shoulder flexion 3+/5  Shoulder abduction 4-/5  Shoulder adduction 4/5  Shoulder extension 4-/5  Shoulder internal rotation 4+/5  Shoulder external rotation 4-/5  (Blank rows = not tested)  SENSATION: Pt reports some numbness and tingling in the left hand  EDEMA: No noted swelling  OBSERVATIONS: Moderate fascial restrictions along the biceps, trapezius, and axillary region   TODAY'S TREATMENT:                                                                                                                              DATE:   08/29/2022  -Manual techniques: provided to upper arm, axillary region, and trapezius to decrease fascial restrictions and increase ROM  - P/ROM: supine; shoulder flexion, abduction, internal/rotation 10x each way - AA/ROM supine; shoulder flexion, abduction, internal/rotation 10x each way -  Wall stretches: stretch up on wall into flexion 10x, abduction 10x   - Proximal shoulder strengthening: paddles, criss cross, circles 30 seconds 2 times     PATIENT EDUCATION: Education details: Table Slides and Wall Slides Person educated: Patient Education method: Explanation, Demonstration, and Handouts Education comprehension: verbalized understanding and returned demonstration  HOME EXERCISE PROGRAM: 4/19: Table Slides  and Wall Slides 08/29/2022: AA/ROM   GOALS: Goals reviewed with patient? Yes  SHORT TERM GOALS: Target date: 09/26/22  Pt will be provided and educated on HEP for LUE mobility for ADL completion  Goal status: INITIAL  2.  Pt will decrease pain in LUE to 3/10 or less in order to sleep for 3+ consecutive hours without waking due to pain.   Goal status: INITIAL  3.  Pt will decrease fascial restrictions in  the LUE to minimal amounts or less in order to complete overhead reaching tasks.   Goal status: INITIAL  4.  Pt will increase LUE A/ROM to [facility] in order to reach overhead and behind back during dressing and bathing tasks.   Goal status: INITIAL  5.  Pt will increase strength to 4+/5 in LUE, in order to complete moderately heavy lifting tasks when cooking and cleaning.  Goal status: INITIAL    ASSESSMENT:  CLINICAL IMPRESSION: Patient stated that she has just been feeling some discomfort rather than pain. She stated that she feels the most pain after sleeping on her shoulder. Began P/ROM and AA/ROM. Pt was able to reach 75% ROM in flexion/abduction. Targeted shoulder strength with proximal stabilizing exercises. Added AA/ROM to HEP, went over each movement with pt and she verbalized understanding.   PERFORMANCE DEFICITS: in functional skills including ADLs, IADLs, ROM, strength, pain, fascial restrictions, Gross motor control, body mechanics, and UE functional use.  IMPAIRMENTS: are limiting patient from ADLs, IADLs, rest and sleep, leisure, and social participation.   COMORBIDITIES: has no other co-morbidities that affects occupational performance. Patient will benefit from skilled OT to address above impairments and improve overall function.  MODIFICATION OR ASSISTANCE TO COMPLETE EVALUATION: No modification of tasks or assist necessary to complete an evaluation.  OT OCCUPATIONAL PROFILE AND HISTORY: Problem focused assessment: Including review of records relating to  presenting problem.  CLINICAL DECISION MAKING: LOW - limited treatment options, no task modification necessary  REHAB POTENTIAL: Good  EVALUATION COMPLEXITY: Low      PLAN:  OT FREQUENCY: 2x/week  OT DURATION: 4 weeks  PLANNED INTERVENTIONS: self care/ADL training, therapeutic exercise, therapeutic activity, manual therapy, passive range of motion, electrical stimulation, moist heat, cryotherapy, patient/family education, coping strategies training, and DME and/or AE instructions  RECOMMENDED OTHER SERVICES: N/A  CONSULTED AND AGREED WITH PLAN OF CARE: Patient  PLAN FOR NEXT SESSION: Manual Therapy as needed, P/ROM, AA/ROM, A/ROM, Proximal shoulder exercises   [facility] [phone] Bevelyn Ngo, OT 08/29/2022, 2:34 PM

## 2022-09-01 DIAGNOSIS — H4042X3 Glaucoma secondary to eye inflammation, left eye, severe stage: Secondary | ICD-10-CM | POA: Diagnosis not present

## 2022-09-01 DIAGNOSIS — H401112 Primary open-angle glaucoma, right eye, moderate stage: Secondary | ICD-10-CM | POA: Diagnosis not present

## 2022-09-02 DIAGNOSIS — I129 Hypertensive chronic kidney disease with stage 1 through stage 4 chronic kidney disease, or unspecified chronic kidney disease: Secondary | ICD-10-CM | POA: Diagnosis not present

## 2022-09-02 DIAGNOSIS — N182 Chronic kidney disease, stage 2 (mild): Secondary | ICD-10-CM | POA: Diagnosis not present

## 2022-09-02 DIAGNOSIS — E212 Other hyperparathyroidism: Secondary | ICD-10-CM | POA: Diagnosis not present

## 2022-09-05 ENCOUNTER — Encounter (HOSPITAL_COMMUNITY): Payer: Self-pay | Admitting: Occupational Therapy

## 2022-09-05 ENCOUNTER — Ambulatory Visit (HOSPITAL_COMMUNITY): Payer: Medicare Other | Attending: Orthopedic Surgery | Admitting: Occupational Therapy

## 2022-09-05 DIAGNOSIS — M25512 Pain in left shoulder: Secondary | ICD-10-CM | POA: Insufficient documentation

## 2022-09-05 DIAGNOSIS — R29898 Other symptoms and signs involving the musculoskeletal system: Secondary | ICD-10-CM | POA: Insufficient documentation

## 2022-09-05 DIAGNOSIS — M25612 Stiffness of left shoulder, not elsewhere classified: Secondary | ICD-10-CM | POA: Diagnosis not present

## 2022-09-05 NOTE — Therapy (Signed)
OUTPATIENT OCCUPATIONAL THERAPY ORTHO TREATMENT  Patient Name: Danielle Howard MRN: [id number] DOB:1950-05-13, 72 y.o., female Today's Date: 09/05/2022  PCP: Trena Platt, MD REFERRING PROVIDER: Wilfred Lacy, MD  END OF SESSION:  OT End of Session - 09/05/22 1423     Visit Number 3    Number of Visits 9    Date for OT Re-Evaluation 09/26/22    Authorization Type Medicare A and B, [employer]    Authorization - Visit Number 2    Progress Note Due on Visit 10    OT Start Time 1350    OT Stop Time 1430    OT Time Calculation (min) 40 min    Activity Tolerance Patient tolerated treatment well    Behavior During Therapy [facility] for tasks assessed/performed               Past Medical History:  Diagnosis Date   Arthritis    Complication of anesthesia    nausea   Headache    Hypertension    Hypothyroidism    Hypothyroidism    Iritis    Thyroid disease    Uveitis of left eye 12/29/2017   Past Surgical History:  Procedure Laterality Date   ABDOMINAL HYSTERECTOMY     CATARACT EXTRACTION Left 2019   CHOLECYSTECTOMY     02/2012   COLONOSCOPY N/A 05/25/2013   Procedure: COLONOSCOPY;  Surgeon: Malissa Hippo, MD;  [facility];  Service: Endoscopy;  Laterality: N/A;  830-moved to 1055 Ann to notify pt   COLONOSCOPY N/A 06/21/2018   Procedure: COLONOSCOPY;  Surgeon: Malissa Hippo, MD;  [facility];  Service: Endoscopy;  Laterality: N/A;  1020   EYE SURGERY N/A    [employer] 07/13/2020   JOINT REPLACEMENT Bilateral    2012,2013-bilateral knees   POLYPECTOMY  06/21/2018   Procedure: POLYPECTOMY;  Surgeon: Malissa Hippo, MD;  [facility];  Service: Endoscopy;;   THYROID SURGERY     Patient Active Problem List   Diagnosis Date Noted   Neck pain 05/21/2022   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   Candidiasis, intertrigo 09/11/2021   Lymphedema 12/10/2020   Lipoma of right upper extremity 12/10/2020   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, severe stage 07/04/2020   Chronic kidney disease (CKD), stage III (moderate) (HCC) 04/04/2019   Gout 10/18/2018   Mixed hyperlipidemia 07/30/2018   Hx of colonic polyps 05/20/2018   Acute iritis, left eye 04/07/2018   Macular pucker, left eye 12/29/2017   Nuclear sclerotic cataract of right eye 12/29/2017   Uveitis 12/29/2017   Hypertensive retinopathy 12/29/2017   PVD (peripheral vascular disease) (HCC) 11/24/2016   Osteoarthritis, hand 10/01/2016   Hypothyroidism 01/23/2016   Prediabetes 03/07/2013   Hashimoto's thyroiditis 01/03/2013   Obesity (BMI 30-39.9) 01/03/2013   Essential hypertension, benign 01/03/2013   Unilateral primary osteoarthritis, right knee 08/11/2011    ONSET DATE: ~2 months  REFERRING DIAG: Left shoulder pain  THERAPY DIAG:  Acute pain of left shoulder  Stiffness of left shoulder, not elsewhere classified  Other symptoms and signs involving the musculoskeletal system  Rationale for Evaluation and Treatment: Rehabilitation  SUBJECTIVE:   SUBJECTIVE STATEMENT:  My  " I have been cleaning the house today's" Pt accompanied by: self  PERTINENT HISTORY: Pt reports taking care of her bed bound father for over a year with shoulder pain building in the last few months. She has had 1 injection that lasted approximately 2  weeks. Xray was negative.   PRECAUTIONS: None  WEIGHT BEARING RESTRICTIONS: No  PAIN:  Are you having pain? Yes: NPRS scale: 4/10 Pain location: Anterior shoulder girdle Pain description: aching Aggravating factors: movement Relieving factors: tylenol  FALLS: Has patient fallen in last 6 months? Yes. Number of falls "several"  LIVING ENVIRONMENT: Lives with: lives alone Lives in: House/apartment  PLOF: Independent  PATIENT GOALS: To reduce pain.  NEXT MD VISIT: May 14th  OBJECTIVE:   HAND DOMINANCE: Left  ADLs: Overall ADLs: Pt reports pain with dressing and difficulty cleaning her self,  especially reaching across to the R side of her body due to LUE limitations and pain. Pt is unable to lift "heavy" items which limits her ability to cook and lift the pots and pans, as well as certain ingredients. At this time she states that she limits cleaning due to the pain and difficulty she has pushing and pulling items.   FUNCTIONAL OUTCOME MEASURES: FOTO: 57.32  UPPER EXTREMITY ROM:     Active ROM Left eval  Shoulder flexion 128  Shoulder abduction 94  Shoulder internal rotation 90  Shoulder external rotation 25  (Blank rows = not tested)  UPPER EXTREMITY MMT:     MMT Left eval  Shoulder flexion 3+/5  Shoulder abduction 4-/5  Shoulder adduction 4/5  Shoulder extension 4-/5  Shoulder internal rotation 4+/5  Shoulder external rotation 4-/5  (Blank rows = not tested)  SENSATION: Pt reports some numbness and tingling in the left hand  EDEMA: No noted swelling  OBSERVATIONS: Moderate fascial restrictions along the biceps, trapezius, and axillary region   TODAY'S TREATMENT:                                                                                                                              DATE:   09/05/2022  -Manual techniques: provided to upper arm, axillary region, and trapezius to decrease fascial restrictions and increase ROM  - P/ROM: supine; shoulder flexion, abduction, internal/rotation 10x each way - AA/ROM supine; shoulder flexion, abduction, internal/rotation 10x each way A/ROM: sitting; shoulder flexion, abduction, internal/rotation 10x each way - Proximal shoulder strengthening: paddles, criss cross, circles 30 seconds 2 times - UBE:  3 mins forwards 3 mins backwards keeping 7.0 pace or higher    08/29/2022  -Manual techniques: provided to upper arm, axillary region, and trapezius to decrease fascial restrictions and increase ROM  - P/ROM: supine; shoulder flexion, abduction, internal/rotation 10x each way - AA/ROM supine; shoulder flexion,  abduction, internal/rotation 10x each way -  Wall stretches: stretch up on wall into flexion 10x, abduction 10x   - Proximal shoulder strengthening: paddles, criss cross, circles 30 seconds 2 times     PATIENT EDUCATION: Education details: Table Slides and Wall Slides Person educated: Patient Education method: Explanation, Demonstration, and Handouts Education comprehension: verbalized understanding and returned demonstration  HOME EXERCISE PROGRAM: 4/19: Table Slides and Wall Slides 4/26: AA/ROM  5/3: A/ROM   GOALS:  Goals reviewed with patient? Yes  SHORT TERM GOALS: Target date: 09/26/22  Pt will be provided and educated on HEP for LUE mobility for ADL completion  Goal status: INITIAL  2.  Pt will decrease pain in LUE to 3/10 or less in order to sleep for 3+ consecutive hours without waking due to pain.   Goal status: INITIAL  3.  Pt will decrease fascial restrictions in the LUE to minimal amounts or less in order to complete overhead reaching tasks.   Goal status: INITIAL  4.  Pt will increase LUE A/ROM to [facility] in order to reach overhead and behind back during dressing and bathing tasks.   Goal status: INITIAL  5.  Pt will increase strength to 4+/5 in LUE, in order to complete moderately heavy lifting tasks when cooking and cleaning.  Goal status: INITIAL    ASSESSMENT:  CLINICAL IMPRESSION: Patient reports that she has been experiencing decreased levels of pain and has been doing more around the house using her L UE.   PERFORMANCE DEFICITS: in functional skills including ADLs, IADLs, ROM, strength, pain, fascial restrictions, Gross motor control, body mechanics, and UE functional use.  IMPAIRMENTS: are limiting patient from ADLs, IADLs, rest and sleep, leisure, and social participation.   COMORBIDITIES: has no other co-morbidities that affects occupational performance. Patient will benefit from skilled OT to address above impairments and improve overall  function.  MODIFICATION OR ASSISTANCE TO COMPLETE EVALUATION: No modification of tasks or assist necessary to complete an evaluation.  OT OCCUPATIONAL PROFILE AND HISTORY: Problem focused assessment: Including review of records relating to presenting problem.  CLINICAL DECISION MAKING: LOW - limited treatment options, no task modification necessary  REHAB POTENTIAL: Good  EVALUATION COMPLEXITY: Low      PLAN:  OT FREQUENCY: 2x/week  OT DURATION: 4 weeks  PLANNED INTERVENTIONS: self care/ADL training, therapeutic exercise, therapeutic activity, manual therapy, passive range of motion, electrical stimulation, moist heat, cryotherapy, patient/family education, coping strategies training, and DME and/or AE instructions  RECOMMENDED OTHER SERVICES: N/A  CONSULTED AND AGREED WITH PLAN OF CARE: Patient  PLAN FOR NEXT SESSION: Manual Therapy as needed, P/ROM, AA/ROM, A/ROM, Proximal shoulder exercises, add scapular strength    [facility] [phone] Bevelyn Ngo, OTR/L 09/05/2022, 2:24 PM

## 2022-09-05 NOTE — Patient Instructions (Signed)
  1) ROM: Abduction (Standing)   Bring arms straight out from sides and raise as high as possible without pain. Repeat ___10_ times per set. Do _1___ sets per session. Do __1-2__ sessions per day.  http://orth.exer.us/910   Copyright  VHI. All rights reserved.   2) Extension (Active) ROM: Extension (Standing)   Bring arms straight back as far as possible without pain. Repeat ____ times per set. Do ____ sets per session. Do ____ sessions per day.  http://orth.exer.us/916   Copyright  VHI. All rights reserved.   3) ROM: External / Internal Rotation - in Abduction (Standing)   With upper arms parallel to floor and elbows bent at right angles, gently rotate arms up then down as far as possible without pain. Repeat ____ times per set. Do ____ sets per session. Do ____ sessions per day.  http://orth.exer.us/912   Copyright  VHI. All rights reserved.    4) Flexors Stretch (Active)   Stand, arms straight at sides. Bring arms straight forward and upward as high as possible without pain. Hold ___ seconds. Repeat ___ times per session. Do ___ sessions per day.  Copyright  VHI. All rights reserved.   5) Scapular Retraction (Standing)   With arms at sides, pinch shoulder blades together. Repeat ____ times per set. Do ____ sets per session. Do ____ sessions per day.  http://orth.exer.us/944   Copyright  VHI. All rights reserved.    1) Seated Row   Sit up straight with elbows by your sides. Pull back with shoulders/elbows, keeping forearms straight, as if pulling back on the reins of a horse. Squeeze shoulder blades together. Repeat _10-15__times, __2-3__sets/day    2) Shoulder Elevation    Sit up straight with arms by your sides. Slowly bring your shoulders up towards your ears. Repeat_10-15__times, __2-3__ sets/day    3) Shoulder Extension    Sit up straight with both arms by your side, draw your arms back behind your waist. Keep your elbows straight.  Repeat __10-15__times, __2-3__sets/day.

## 2022-09-08 ENCOUNTER — Ambulatory Visit (HOSPITAL_COMMUNITY): Payer: Medicare Other | Admitting: Occupational Therapy

## 2022-09-08 ENCOUNTER — Encounter (HOSPITAL_COMMUNITY): Payer: Self-pay | Admitting: Occupational Therapy

## 2022-09-08 DIAGNOSIS — M25612 Stiffness of left shoulder, not elsewhere classified: Secondary | ICD-10-CM

## 2022-09-08 DIAGNOSIS — M25512 Pain in left shoulder: Secondary | ICD-10-CM | POA: Diagnosis not present

## 2022-09-08 DIAGNOSIS — R29898 Other symptoms and signs involving the musculoskeletal system: Secondary | ICD-10-CM

## 2022-09-08 NOTE — Therapy (Unsigned)
OUTPATIENT OCCUPATIONAL THERAPY ORTHO TREATMENT  Patient Name: Danielle Howard MRN: [id number] DOB:1950-05-19, 72 y.o., female Today's Date: 09/08/2022  PCP: Trena Platt, MD REFERRING PROVIDER: Wilfred Lacy, MD  END OF SESSION:  OT End of Session - 09/08/22 1438     Visit Number 4    Number of Visits 9    Date for OT Re-Evaluation 09/26/22    Authorization Type Medicare A and B, [employer]nd [employer]    Progress Note Due on Visit 10    OT Start Time 1434    OT Stop Time 1515    OT Time Calculation (min) 41 min    Activity Tolerance Patient tolerated treatment well    Behavior During Therapy [facility] for tasks assessed/performed               Past Medical History:  Diagnosis Date   Arthritis    Complication of anesthesia    nausea   Headache    Hypertension    Hypothyroidism    Hypothyroidism    Iritis    Thyroid disease    Uveitis of left eye 12/29/2017   Past Surgical History:  Procedure Laterality Date   ABDOMINAL HYSTERECTOMY     CATARACT EXTRACTION Left 2019   CHOLECYSTECTOMY     02/2012   COLONOSCOPY N/A 05/25/2013   Procedure: COLONOSCOPY;  Surgeon: Malissa Hippo, MD;  [facility];  Service: Endoscopy;  Laterality: N/A;  830-moved to 1055 Ann to notify pt   COLONOSCOPY N/A 06/21/2018   Procedure: COLONOSCOPY;  Surgeon: Malissa Hippo, MD;  [facility];  Service: Endoscopy;  Laterality: N/A;  1020   EYE SURGERY N/A    [employer] 07/13/2020   JOINT REPLACEMENT Bilateral    2012,2013-bilateral knees   POLYPECTOMY  06/21/2018   Procedure: POLYPECTOMY;  Surgeon: Malissa Hippo, MD;  [facility];  Service: Endoscopy;;   THYROID SURGERY     Patient Active Problem List   Diagnosis Date Noted   Neck pain 05/21/2022   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   Candidiasis, intertrigo 09/11/2021   Lymphedema 12/10/2020   Lipoma of right upper extremity 12/10/2020   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, severe  stage 07/04/2020   Chronic kidney disease (CKD), stage III (moderate) (HCC) 04/04/2019   Gout 10/18/2018   Mixed hyperlipidemia 07/30/2018   Hx of colonic polyps 05/20/2018   Acute iritis, left eye 04/07/2018   Macular pucker, left eye 12/29/2017   Nuclear sclerotic cataract of right eye 12/29/2017   Uveitis 12/29/2017   Hypertensive retinopathy 12/29/2017   PVD (peripheral vascular disease) (HCC) 11/24/2016   Osteoarthritis, hand 10/01/2016   Hypothyroidism 01/23/2016   Prediabetes 03/07/2013   Hashimoto's thyroiditis 01/03/2013   Obesity (BMI 30-39.9) 01/03/2013   Essential hypertension, benign 01/03/2013   Unilateral primary osteoarthritis, right knee 08/11/2011    ONSET DATE: ~2 months  REFERRING DIAG: Left shoulder pain  THERAPY DIAG:  Acute pain of left shoulder  Stiffness of left shoulder, not elsewhere classified  Other symptoms and signs involving the musculoskeletal system  Rationale for Evaluation and Treatment: Rehabilitation  SUBJECTIVE:   SUBJECTIVE STATEMENT:  My  " *** " Pt accompanied by: self  PERTINENT HISTORY: Pt reports taking care of her bed bound father for over a year with shoulder pain building in the last few months. She has had 1 injection that lasted approximately 2 weeks. Xray was negative.   PRECAUTIONS: None  WEIGHT BEARING RESTRICTIONS: No  PAIN:  Are you having pain? Yes: NPRS scale: 4/10 Pain location: Anterior shoulder girdle Pain description: aching Aggravating factors: movement Relieving factors: tylenol  FALLS: Has patient fallen in last 6 months? Yes. Number of falls "several"  LIVING ENVIRONMENT: Lives with: lives alone Lives in: House/apartment  PLOF: Independent  PATIENT GOALS: To reduce pain.  NEXT MD VISIT: May 14th  OBJECTIVE:   HAND DOMINANCE: Left  ADLs: Overall ADLs: Pt reports pain with dressing and difficulty cleaning her self, especially reaching across to the R side of her body due to LUE  limitations and pain. Pt is unable to lift "heavy" items which limits her ability to cook and lift the pots and pans, as well as certain ingredients. At this time she states that she limits cleaning due to the pain and difficulty she has pushing and pulling items.   FUNCTIONAL OUTCOME MEASURES: FOTO: 57.32  UPPER EXTREMITY ROM:     Active ROM Left eval  Shoulder flexion 128  Shoulder abduction 94  Shoulder internal rotation 90  Shoulder external rotation 25  (Blank rows = not tested)  UPPER EXTREMITY MMT:     MMT Left eval  Shoulder flexion 3+/5  Shoulder abduction 4-/5  Shoulder adduction 4/5  Shoulder extension 4-/5  Shoulder internal rotation 4+/5  Shoulder external rotation 4-/5  (Blank rows = not tested)  SENSATION: Pt reports some numbness and tingling in the left hand  EDEMA: No noted swelling  OBSERVATIONS: Moderate fascial restrictions along the biceps, trapezius, and axillary region   TODAY'S TREATMENT:                                                                                                                              DATE:   09/08/22 -Manual techniques: provided to upper arm, axillary region, and trapezius to decrease fascial restrictions and increase ROM  -AA/ROM: 2lb dowel, sitting, flexion, abduction, protraction, horizontal abduction, er/IR, x12 - A/ROM: -Scapular Strengthening: red band,   09/05/2022  -Manual techniques: provided to upper arm, axillary region, and trapezius to decrease fascial restrictions and increase ROM  - P/ROM: supine; shoulder flexion, abduction, internal/rotation 10x each way - AA/ROM supine; shoulder flexion, abduction, internal/rotation 10x each way A/ROM: sitting; shoulder flexion, abduction, internal/rotation 10x each way - Proximal shoulder strengthening: paddles, criss cross, circles 30 seconds 2 times - UBE:  3 mins forwards 3 mins backwards keeping 7.0 pace or higher    08/29/2022  -Manual techniques: provided  to upper arm, axillary region, and trapezius to decrease fascial restrictions and increase ROM  - P/ROM: supine; shoulder flexion, abduction, internal/rotation 10x each way - AA/ROM supine; shoulder flexion, abduction, internal/rotation 10x each way -  Wall stretches: stretch up on wall into flexion 10x, abduction 10x   - Proximal shoulder strengthening: paddles, criss cross, circles 30 seconds 2 times     PATIENT EDUCATION: Education details: Publishing rights manager Person educated: Patient Education method: Explanation, Demonstration, and Handouts Education comprehension: verbalized understanding  and returned demonstration  HOME EXERCISE PROGRAM: 4/19: Table Slides and Wall Slides 4/26: AA/ROM  5/3: A/ROM 5/6: Scapular strengthening   GOALS: Goals reviewed with patient? Yes  SHORT TERM GOALS: Target date: 09/26/22  Pt will be provided and educated on HEP for LUE mobility for ADL completion  Goal status: INITIAL  2.  Pt will decrease pain in LUE to 3/10 or less in order to sleep for 3+ consecutive hours without waking due to pain.   Goal status: INITIAL  3.  Pt will decrease fascial restrictions in the LUE to minimal amounts or less in order to complete overhead reaching tasks.   Goal status: INITIAL  4.  Pt will increase LUE A/ROM to [facility] in order to reach overhead and behind back during dressing and bathing tasks.   Goal status: INITIAL  5.  Pt will increase strength to 4+/5 in LUE, in order to complete moderately heavy lifting tasks when cooking and cleaning.  Goal status: INITIAL    ASSESSMENT:  CLINICAL IMPRESSION: Patient reports that she has been experiencing decreased levels of pain and has been doing more around the house using her L UE.   PERFORMANCE DEFICITS: in functional skills including ADLs, IADLs, ROM, strength, pain, fascial restrictions, Gross motor control, body mechanics, and UE functional use.  IMPAIRMENTS: are limiting patient from ADLs,  IADLs, rest and sleep, leisure, and social participation.   COMORBIDITIES: has no other co-morbidities that affects occupational performance. Patient will benefit from skilled OT to address above impairments and improve overall function.  MODIFICATION OR ASSISTANCE TO COMPLETE EVALUATION: No modification of tasks or assist necessary to complete an evaluation.  OT OCCUPATIONAL PROFILE AND HISTORY: Problem focused assessment: Including review of records relating to presenting problem.  CLINICAL DECISION MAKING: LOW - limited treatment options, no task modification necessary  REHAB POTENTIAL: Good  EVALUATION COMPLEXITY: Low      PLAN:  OT FREQUENCY: 2x/week  OT DURATION: 4 weeks  PLANNED INTERVENTIONS: self care/ADL training, therapeutic exercise, therapeutic activity, manual therapy, passive range of motion, electrical stimulation, moist heat, cryotherapy, patient/family education, coping strategies training, and DME and/or AE instructions  RECOMMENDED OTHER SERVICES: N/A  CONSULTED AND AGREED WITH PLAN OF CARE: Patient  PLAN FOR NEXT SESSION: Manual Therapy as needed, P/ROM, AA/ROM, A/ROM, Proximal shoulder exercises, add scapular strength    [facility] [phone] Verta Riedlinger Elane Bing Plume, OTR/L 09/08/2022, 2:39 PM

## 2022-09-08 NOTE — Patient Instructions (Signed)

## 2022-09-09 DIAGNOSIS — H2511 Age-related nuclear cataract, right eye: Secondary | ICD-10-CM | POA: Diagnosis not present

## 2022-09-09 DIAGNOSIS — E119 Type 2 diabetes mellitus without complications: Secondary | ICD-10-CM | POA: Diagnosis not present

## 2022-09-09 DIAGNOSIS — H35033 Hypertensive retinopathy, bilateral: Secondary | ICD-10-CM | POA: Diagnosis not present

## 2022-09-09 DIAGNOSIS — Z79899 Other long term (current) drug therapy: Secondary | ICD-10-CM | POA: Diagnosis not present

## 2022-09-09 DIAGNOSIS — Z961 Presence of intraocular lens: Secondary | ICD-10-CM | POA: Diagnosis not present

## 2022-09-09 DIAGNOSIS — T8521XD Breakdown (mechanical) of intraocular lens, subsequent encounter: Secondary | ICD-10-CM | POA: Diagnosis not present

## 2022-09-09 DIAGNOSIS — H35372 Puckering of macula, left eye: Secondary | ICD-10-CM | POA: Diagnosis not present

## 2022-09-09 DIAGNOSIS — H20042 Secondary noninfectious iridocyclitis, left eye: Secondary | ICD-10-CM | POA: Diagnosis not present

## 2022-09-11 ENCOUNTER — Encounter (HOSPITAL_COMMUNITY): Payer: Self-pay | Admitting: Occupational Therapy

## 2022-09-11 ENCOUNTER — Ambulatory Visit (HOSPITAL_COMMUNITY): Payer: Medicare Other | Admitting: Occupational Therapy

## 2022-09-11 DIAGNOSIS — R29898 Other symptoms and signs involving the musculoskeletal system: Secondary | ICD-10-CM

## 2022-09-11 DIAGNOSIS — M25512 Pain in left shoulder: Secondary | ICD-10-CM

## 2022-09-11 DIAGNOSIS — M25612 Stiffness of left shoulder, not elsewhere classified: Secondary | ICD-10-CM | POA: Diagnosis not present

## 2022-09-11 NOTE — Therapy (Signed)
OUTPATIENT OCCUPATIONAL THERAPY ORTHO TREATMENT  Patient Name: Danielle Howard MRN: [id number] DOB:1951-04-17, 72 y.o., female Today's Date: 09/11/2022  PCP: Trena Platt, MD REFERRING PROVIDER: Wilfred Lacy, MD  END OF SESSION:  OT End of Session - 09/11/22 1313     Visit Number 5    Number of Visits 9    Date for OT Re-Evaluation 09/26/22    Authorization Type Medicare A and B, 2nd [employer]    Progress Note Due on Visit 10    OT Start Time 1310    OT Stop Time 1350    OT Time Calculation (min) 40 min    Activity Tolerance Patient tolerated treatment well    Behavior During Therapy [facility] for tasks assessed/performed               Past Medical History:  Diagnosis Date   Arthritis    Complication of anesthesia    nausea   Headache    Hypertension    Hypothyroidism    Hypothyroidism    Iritis    Thyroid disease    Uveitis of left eye 12/29/2017   Past Surgical History:  Procedure Laterality Date   ABDOMINAL HYSTERECTOMY     CATARACT EXTRACTION Left 2019   CHOLECYSTECTOMY     02/2012   COLONOSCOPY N/A 05/25/2013   Procedure: COLONOSCOPY;  Surgeon: Malissa Hippo, MD;  [facility];  Service: Endoscopy;  Laterality: N/A;  830-moved to 1055 Ann to notify pt   COLONOSCOPY N/A 06/21/2018   Procedure: COLONOSCOPY;  Surgeon: Malissa Hippo, MD;  [facility];  Service: Endoscopy;  Laterality: N/A;  1020   EYE SURGERY N/A    [facility] 07/13/2020   JOINT REPLACEMENT Bilateral    2012,2013-bilateral knees   POLYPECTOMY  06/21/2018   Procedure: POLYPECTOMY;  Surgeon: Malissa Hippo, MD;  [facility];  Service: Endoscopy;;   THYROID SURGERY     Patient Active Problem List   Diagnosis Date Noted   Neck pain 05/21/2022   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   Candidiasis, intertrigo 09/11/2021   Lymphedema 12/10/2020   Lipoma of right upper extremity 12/10/2020   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, severe  stage 07/04/2020   Chronic kidney disease (CKD), stage III (moderate) (HCC) 04/04/2019   Gout 10/18/2018   Mixed hyperlipidemia 07/30/2018   Hx of colonic polyps 05/20/2018   Acute iritis, left eye 04/07/2018   Macular pucker, left eye 12/29/2017   Nuclear sclerotic cataract of right eye 12/29/2017   Uveitis 12/29/2017   Hypertensive retinopathy 12/29/2017   PVD (peripheral vascular disease) (HCC) 11/24/2016   Osteoarthritis, hand 10/01/2016   Hypothyroidism 01/23/2016   Prediabetes 03/07/2013   Hashimoto's thyroiditis 01/03/2013   Obesity (BMI 30-39.9) 01/03/2013   Essential hypertension, benign 01/03/2013   Unilateral primary osteoarthritis, right knee 08/11/2011    ONSET DATE: ~2 months  REFERRING DIAG: Left shoulder pain  THERAPY DIAG:  Acute pain of left shoulder  Stiffness of left shoulder, not elsewhere classified  Other symptoms and signs involving the musculoskeletal system  Rationale for Evaluation and Treatment: Rehabilitation  SUBJECTIVE:   SUBJECTIVE STATEMENT:  " I dont want surgery " Pt accompanied by: self  PERTINENT HISTORY: Pt reports taking care of her bed bound father for over a year with shoulder pain building in the last few months. She has had 1 injection that lasted approximately 2 weeks. Xray was negative.   PRECAUTIONS: None  WEIGHT BEARING RESTRICTIONS:  No  PAIN:  Are you having pain? Yes: NPRS scale: 4/10 Pain location: Anterior shoulder girdle Pain description: aching Aggravating factors: movement Relieving factors: tylenol  FALLS: Has patient fallen in last 6 months? Yes. Number of falls "several"  LIVING ENVIRONMENT: Lives with: lives alone Lives in: House/apartment  PLOF: Independent  PATIENT GOALS: To reduce pain.  NEXT MD VISIT: May 14th  OBJECTIVE:   HAND DOMINANCE: Left  ADLs: Overall ADLs: Pt reports pain with dressing and difficulty cleaning her self, especially reaching across to the R side of her body due to  LUE limitations and pain. Pt is unable to lift "heavy" items which limits her ability to cook and lift the pots and pans, as well as certain ingredients. At this time she states that she limits cleaning due to the pain and difficulty she has pushing and pulling items.   FUNCTIONAL OUTCOME MEASURES: FOTO: 57.32  UPPER EXTREMITY ROM:     Active ROM Left eval  Shoulder flexion 128  Shoulder abduction 94  Shoulder internal rotation 90  Shoulder external rotation 25  (Blank rows = not tested)  UPPER EXTREMITY MMT:     MMT Left eval  Shoulder flexion 3+/5  Shoulder abduction 4-/5  Shoulder adduction 4/5  Shoulder extension 4-/5  Shoulder internal rotation 4+/5  Shoulder external rotation 4-/5  (Blank rows = not tested)  SENSATION: Pt reports some numbness and tingling in the left hand  EDEMA: No noted swelling  OBSERVATIONS: Moderate fascial restrictions along the biceps, trapezius, and axillary region   TODAY'S TREATMENT:                                                                                                                              DATE:   09/11/22 - A/ROM: seated, flexion, abduction, protraction, horizontal abduction, er/IR, x15 - Proximal Shoulder Strengthening: Paddles, criss cross, circles both direction, x10 each - Ball on the wall: ABC's - Shoulder Strengthening: 2lb dumbbells, flexion, abduction, protraction, horizontal abduction, er/IR, x12 - Loop Band Exercises: green band, wall taps, V ups, Wall Clocks, x10 - Overhead Lacing  09/08/22 -Manual techniques: provided to upper arm, axillary region, and trapezius to decrease fascial restrictions and increase ROM  -AA/ROM: 2lb dowel, sitting, flexion, abduction, protraction, horizontal abduction, er/IR, x12 - A/ROM: seated, flexion, abduction, protraction, horizontal abduction, er/IR, x10 -Scapular Strengthening: red band, extension, protraction, retraction, rows, x15  09/05/2022 -Manual techniques: provided  to upper arm, axillary region, and trapezius to decrease fascial restrictions and increase ROM  - P/ROM: supine; shoulder flexion, abduction, internal/rotation 10x each way - AA/ROM supine; shoulder flexion, abduction, internal/rotation 10x each way A/ROM: sitting; shoulder flexion, abduction, internal/rotation 10x each way - Proximal shoulder strengthening: paddles, criss cross, circles 30 seconds 2 times - UBE:  3 mins forwards 3 mins backwards keeping 7.0 pace or higher       PATIENT EDUCATION: Education details: Shoulder Strengthening - dumbbells Person educated: Patient Education method: Explanation, Demonstration, and  Handouts Education comprehension: verbalized understanding and returned demonstration  HOME EXERCISE PROGRAM: 4/19: Table Slides and Wall Slides 4/26: AA/ROM  5/3: A/ROM 5/6: Scapular strengthening  5/9: Shoulder Strengthening - Dumbbells  GOALS: Goals reviewed with patient? Yes  SHORT TERM GOALS: Target date: 09/26/22  Pt will be provided and educated on HEP for LUE mobility for ADL completion  Goal status: IN PROGRESS  2.  Pt will decrease pain in LUE to 3/10 or less in order to sleep for 3+ consecutive hours without waking due to pain.   Goal status: IN PROGRESS  3.  Pt will decrease fascial restrictions in the LUE to minimal amounts or less in order to complete overhead reaching tasks.   Goal status: IN PROGRESS  4.  Pt will increase LUE A/ROM to [facility] in order to reach overhead and behind back during dressing and bathing tasks.   Goal status: IN PROGRESS  5.  Pt will increase strength to 4+/5 in LUE, in order to complete moderately heavy lifting tasks when cooking and cleaning.  Goal status: IN PROGRESS    ASSESSMENT:  CLINICAL IMPRESSION: This session pt reporting minimal pain with good ROM. She demonstrated approximately 80% of full ROM with continued low pain levels. Additionally she was able to complete multiple strengthening and stability  exercises with improving strength and tolerance. She required short <1 min rest breaks in between each set of exercises. OT providing verbal and tactile cuing for positioning and technique throughout this session.    PERFORMANCE DEFICITS: in functional skills including ADLs, IADLs, ROM, strength, pain, fascial restrictions, Gross motor control, body mechanics, and UE functional use.  PLAN:  OT FREQUENCY: 2x/week  OT DURATION: 4 weeks  PLANNED INTERVENTIONS: self care/ADL training, therapeutic exercise, therapeutic activity, manual therapy, passive range of motion, electrical stimulation, moist heat, cryotherapy, patient/family education, coping strategies training, and DME and/or AE instructions  RECOMMENDED OTHER SERVICES: N/A  CONSULTED AND AGREED WITH PLAN OF CARE: Patient  PLAN FOR NEXT SESSION: Manual Therapy as needed, P/ROM, AA/ROM, A/ROM, Proximal shoulder exercises, scapular strength, add shoulder strengthening    Trish Mage, OTR/L [facility] [phone] Henritta Mutz Rosemarie Beath, OT 09/11/2022, 1:14 PM

## 2022-09-16 DIAGNOSIS — M25512 Pain in left shoulder: Secondary | ICD-10-CM | POA: Diagnosis not present

## 2022-09-17 ENCOUNTER — Ambulatory Visit (HOSPITAL_COMMUNITY): Payer: Medicare Other | Admitting: Occupational Therapy

## 2022-09-17 ENCOUNTER — Encounter (HOSPITAL_COMMUNITY): Payer: Self-pay | Admitting: Occupational Therapy

## 2022-09-17 DIAGNOSIS — R29898 Other symptoms and signs involving the musculoskeletal system: Secondary | ICD-10-CM | POA: Diagnosis not present

## 2022-09-17 DIAGNOSIS — M25612 Stiffness of left shoulder, not elsewhere classified: Secondary | ICD-10-CM

## 2022-09-17 DIAGNOSIS — M25512 Pain in left shoulder: Secondary | ICD-10-CM

## 2022-09-17 NOTE — Therapy (Signed)
OUTPATIENT OCCUPATIONAL THERAPY ORTHO REASSESSMENT & TREATMENT DISCHARGE SUMMARY  Patient Name: Danielle Howard MRN: [id number] DOB:[birth year], 72 y.o., female Today's Date: 09/17/2022  PCP: Trena Platt, MD REFERRING PROVIDER: Wilfred Lacy, MD  END OF SESSION:  OT End of Session - 09/17/22 1325     Visit Number 6    Number of Visits 9    Date for OT Re-Evaluation 09/26/22    Authorization Type Medicare A and B, [employer]nd [employer]    Authorization - Visit Number 3    Progress Note Due on Visit 10    OT Start Time 1302    OT Stop Time 1331    OT Time Calculation (min) 29 min    Activity Tolerance Patient tolerated treatment well    Behavior During Therapy [facility] for tasks assessed/performed                Past Medical History:  Diagnosis Date   Arthritis    Complication of anesthesia    nausea   Headache    Hypertension    Hypothyroidism    Hypothyroidism    Iritis    Thyroid disease    Uveitis of left eye 12/29/2017   Past Surgical History:  Procedure Laterality Date   ABDOMINAL HYSTERECTOMY     CATARACT EXTRACTION Left 2019   CHOLECYSTECTOMY     02/2012   COLONOSCOPY N/A 05/25/2013   Procedure: COLONOSCOPY;  Surgeon: Malissa Hippo, MD;  [facility];  Service: Endoscopy;  Laterality: N/A;  830-moved to 1055 Ann to notify pt   COLONOSCOPY N/A 06/21/2018   Procedure: COLONOSCOPY;  Surgeon: Malissa Hippo, MD;  [facility];  Service: Endoscopy;  Laterality: N/A;  1020   EYE SURGERY N/A    Phreesia 07/13/2020   JOINT REPLACEMENT Bilateral    2012,2013-bilateral knees   POLYPECTOMY  06/21/2018   Procedure: POLYPECTOMY;  Surgeon: Malissa Hippo, MD;  [facility];  Service: Endoscopy;;   THYROID SURGERY     Patient Active Problem List   Diagnosis Date Noted   Neck pain 05/21/2022   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   Candidiasis, intertrigo 09/11/2021   Lymphedema 12/10/2020   Lipoma of right upper extremity  12/10/2020   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, severe stage 07/04/2020   Chronic kidney disease (CKD), stage III (moderate) (HCC) 04/04/2019   Gout 10/18/2018   Mixed hyperlipidemia 07/30/2018   Hx of colonic polyps 05/20/2018   Acute iritis, left eye 04/07/2018   Macular pucker, left eye 12/29/2017   Nuclear sclerotic cataract of right eye 12/29/2017   Uveitis 12/29/2017   Hypertensive retinopathy 12/29/2017   PVD (peripheral vascular disease) (HCC) 11/24/2016   Osteoarthritis, hand 10/01/2016   Hypothyroidism 01/23/2016   Prediabetes 03/07/2013   Hashimoto's thyroiditis 01/03/2013   Obesity (BMI 30-39.9) 01/03/2013   Essential hypertension, benign 01/03/2013   Unilateral primary osteoarthritis, right knee 08/11/2011    ONSET DATE: ~2 months  REFERRING DIAG: Left shoulder pain  THERAPY DIAG:  Acute pain of left shoulder  Stiffness of left shoulder, not elsewhere classified  Other symptoms and signs involving the musculoskeletal system  Rationale for Evaluation and Treatment: Rehabilitation  SUBJECTIVE:   SUBJECTIVE STATEMENT: S: I don't think I need to come back.   PERTINENT HISTORY: Pt reports taking care of her bed bound father for over a year with shoulder pain building in the last few months. She has had 1 injection that lasted approximately 2  weeks. Xray was negative.   PRECAUTIONS: None  WEIGHT BEARING RESTRICTIONS: No  PAIN:  Are you having pain? No  FALLS: Has patient fallen in last 6 months? Yes. Number of falls "several"   PATIENT GOALS: To reduce pain.  NEXT MD VISIT: None scheduled  OBJECTIVE:   HAND DOMINANCE: Left  ADLs: Overall ADLs: Pt reports pain with dressing and difficulty cleaning her self, especially reaching across to the R side of her body due to LUE limitations and pain. Pt is unable to lift "heavy" items which limits her ability to cook and lift the pots and pans, as well as certain ingredients. At this time she  states that she limits cleaning due to the pain and difficulty she has pushing and pulling items.   FUNCTIONAL OUTCOME MEASURES: FOTO: 57.32 09/17/22: 62/100  UPPER EXTREMITY ROM:     Active ROM Left eval Left 09/17/22  Shoulder flexion 128 130  Shoulder abduction 94 127  Shoulder internal rotation 90 90  Shoulder external rotation 25 45  (Blank rows = not tested)  UPPER EXTREMITY MMT:     MMT Left eval Left 09/17/22  Shoulder flexion 3+/5 4/5  Shoulder abduction 4-/5 4+/5  Shoulder adduction 4/5   Shoulder extension 4-/5   Shoulder internal rotation 4+/5 5/5  Shoulder external rotation 4-/5 4-/5  (Blank rows = not tested)  SENSATION: Pt reports some numbness and tingling in the left hand  EDEMA: No noted swelling  OBSERVATIONS: Moderate fascial restrictions along the biceps, trapezius, and axillary region   TODAY'S TREATMENT:                                                                                                                              DATE:  09/17/22 - Shoulder Strengthening: 2lb dumbbells, flexion, abduction, protraction, horizontal abduction, er/IR, x12   09/11/22 - A/ROM: seated, flexion, abduction, protraction, horizontal abduction, er/IR, x15 - Proximal Shoulder Strengthening: Paddles, criss cross, circles both direction, x10 each - Ball on the wall: ABC's - Shoulder Strengthening: 2lb dumbbells, flexion, abduction, protraction, horizontal abduction, er/IR, x12 - Loop Band Exercises: green band, wall taps, V ups, Wall Clocks, x10 - Overhead Lacing  09/08/22 -Manual techniques: provided to upper arm, axillary region, and trapezius to decrease fascial restrictions and increase ROM  -AA/ROM: 2lb dowel, sitting, flexion, abduction, protraction, horizontal abduction, er/IR, x12 - A/ROM: seated, flexion, abduction, protraction, horizontal abduction, er/IR, x10 -Scapular Strengthening: red band, extension, protraction, retraction, rows, x15   PATIENT  EDUCATION: Education details: reviewed strengthening HEP Person educated: Patient Education method: Explanation, Demonstration, and Handouts Education comprehension: verbalized understanding and returned demonstration  HOME EXERCISE PROGRAM: 4/19: Table Slides and Wall Slides 4/26: AA/ROM  5/3: A/ROM 5/6: Scapular strengthening  5/9: Shoulder Strengthening - Dumbbells  GOALS: Goals reviewed with patient? Yes  SHORT TERM GOALS: Target date: 09/26/22  Pt will be provided and educated on HEP for LUE mobility for ADL completion  Goal status: MET  2.  Pt will decrease pain in LUE to 3/10 or less in order to sleep for 3+ consecutive hours without waking due to pain.   Goal status: MET  3.  Pt will decrease fascial restrictions in the LUE to minimal amounts or less in order to complete overhead reaching tasks.   Goal status: MET  4.  Pt will increase LUE A/ROM to [facility] in order to reach overhead and behind back during dressing and bathing tasks.   Goal status: MET  5.  Pt will increase strength to 4+/5 in LUE, in order to complete moderately heavy lifting tasks when cooking and cleaning.  Goal status: PARTIALLY MET    ASSESSMENT:  CLINICAL IMPRESSION: Pt reporting MD appt went well, she has been released unless she needs to return. Pt reporting her pain has decreased significantly and she feels that she is ready to be done. Pt has made good progress with her functioning, meeting 4/5 goals and partially meeting her strength goal. Pt completing shoulder strengthening this session, verbal cuing for form and technique, cuing to take rest breaks when needed. Pt is agreeable to discharge today with HEP.   PERFORMANCE DEFICITS: in functional skills including ADLs, IADLs, ROM, strength, pain, fascial restrictions, Gross motor control, body mechanics, and UE functional use.  PLAN:  OT FREQUENCY: 2x/week  OT DURATION: 4 weeks  PLANNED INTERVENTIONS: self care/ADL training,  therapeutic exercise, therapeutic activity, manual therapy, passive range of motion, electrical stimulation, moist heat, cryotherapy, patient/family education, coping strategies training, and DME and/or AE instructions  CONSULTED AND AGREED WITH PLAN OF CARE: Patient  PLAN FOR NEXT SESSION: N/A-discharge today   OCCUPATIONAL THERAPY DISCHARGE SUMMARY  Visits from Start of Care: 6  Current functional level related to goals / functional outcomes: See above. Pt has met 4/5 goals, partially meeting her strength goal. Pt is pleased with her functioning and reports low pain level, mostly in the morning after she sleeps on her shoulder.    Remaining deficits: Limited ROM and decreased pain   Education / Equipment: HEP for strengthening   Patient agrees to discharge. Patient goals were met. Patient is being discharged due to meeting the stated rehab goals.Ezra Sites, OTR/L  [phone] 09/17/2022, 1:31 PM

## 2022-09-19 ENCOUNTER — Encounter (HOSPITAL_COMMUNITY): Payer: Medicare Other | Admitting: Occupational Therapy

## 2022-09-22 ENCOUNTER — Encounter (HOSPITAL_COMMUNITY): Payer: Medicare Other | Admitting: Occupational Therapy

## 2022-09-24 ENCOUNTER — Encounter (HOSPITAL_COMMUNITY): Payer: Medicare Other | Admitting: Occupational Therapy

## 2022-09-30 ENCOUNTER — Other Ambulatory Visit: Payer: Self-pay | Admitting: "Endocrinology

## 2022-10-01 DIAGNOSIS — E782 Mixed hyperlipidemia: Secondary | ICD-10-CM | POA: Diagnosis not present

## 2022-10-01 DIAGNOSIS — E890 Postprocedural hypothyroidism: Secondary | ICD-10-CM | POA: Diagnosis not present

## 2022-10-02 LAB — TSH: TSH: 0.03 u[IU]/mL — ABNORMAL LOW (ref 0.450–4.500)

## 2022-10-02 LAB — LIPID PANEL
Chol/HDL Ratio: 2.6 ratio (ref 0.0–4.4)
Cholesterol, Total: 179 mg/dL (ref 100–199)
HDL: 68 mg/dL (ref >39)
LDL Chol Calc (NIH): 93 mg/dL (ref 0–99)
Triglycerides: 101 mg/dL (ref 0–149)
VLDL Cholesterol Cal: 18 mg/dL (ref 5–40)

## 2022-10-02 LAB — T4, FREE: Free T4: 1.68 ng/dL (ref 0.82–1.77)

## 2022-10-09 ENCOUNTER — Ambulatory Visit (INDEPENDENT_AMBULATORY_CARE_PROVIDER_SITE_OTHER): Payer: Medicare Other | Admitting: "Endocrinology

## 2022-10-09 ENCOUNTER — Encounter: Payer: Self-pay | Admitting: "Endocrinology

## 2022-10-09 VITALS — BP 118/72 | HR 80 | Ht 66.0 in | Wt 199.2 lb

## 2022-10-09 DIAGNOSIS — E6609 Other obesity due to excess calories: Secondary | ICD-10-CM | POA: Diagnosis not present

## 2022-10-09 DIAGNOSIS — E890 Postprocedural hypothyroidism: Secondary | ICD-10-CM

## 2022-10-09 DIAGNOSIS — R7303 Prediabetes: Secondary | ICD-10-CM | POA: Diagnosis not present

## 2022-10-09 DIAGNOSIS — Z6832 Body mass index (BMI) 32.0-32.9, adult: Secondary | ICD-10-CM | POA: Diagnosis not present

## 2022-10-09 DIAGNOSIS — E782 Mixed hyperlipidemia: Secondary | ICD-10-CM | POA: Diagnosis not present

## 2022-10-09 LAB — POCT GLYCOSYLATED HEMOGLOBIN (HGB A1C): HbA1c, POC (controlled diabetic range): 5.5 % (ref 0.0–7.0)

## 2022-10-09 MED ORDER — LEVOTHYROXINE SODIUM 125 MCG PO TABS: 125.0000 ug | ORAL_TABLET | Freq: Every day | ORAL | 1 refills | Status: DC

## 2022-10-09 NOTE — Progress Notes (Signed)
10/09/2022                       Endocrinology Follow Up Note    Subjective:    Patient ID: Danielle Howard, female    DOB: Nov 04, 1950, PCP Anabel Halon, MD   Past Medical History:  Diagnosis Date   Arthritis    Complication of anesthesia    nausea   Headache    Hypertension    Hypothyroidism    Hypothyroidism    Iritis    Thyroid disease    Uveitis of left eye 12/29/2017   Past Surgical History:  Procedure Laterality Date   ABDOMINAL HYSTERECTOMY     CATARACT EXTRACTION Left 2019   CATARACT EXTRACTION Right 06/12/2022   CHOLECYSTECTOMY     02/2012   COLONOSCOPY N/A 05/25/2013   Procedure: COLONOSCOPY;  Surgeon: Malissa Hippo, MD;  [facility];  Service: Endoscopy;  Laterality: N/A;  830-moved to 1055 Ann to notify pt   COLONOSCOPY N/A 06/21/2018   Procedure: COLONOSCOPY;  Surgeon: Malissa Hippo, MD;  [facility];  Service: Endoscopy;  Laterality: N/A;  1020   CYST EXCISION  08/21/2022   EYE SURGERY N/A    Phreesia 07/13/2020   JOINT REPLACEMENT Bilateral    2012,2013-bilateral knees   POLYPECTOMY  06/21/2018   Procedure: POLYPECTOMY;  Surgeon: Malissa Hippo, MD;  [facility];  Service: Endoscopy;;   THYROID SURGERY     Social History   Socioeconomic History   Marital status: Single    Spouse name: Not on file   Number of children: Not on file   Years of education: Not on file   Highest education level: Not on file  Occupational History   Not on file  Tobacco Use   Smoking status: Never   Smokeless tobacco: Never  Vaping Use   Vaping Use: Never used  Substance and Sexual Activity   Alcohol use: Yes    Comment: occasional   Drug use: No   Sexual activity: Yes  Other Topics Concern   Not on file  Social History Narrative   Not on file   Social Determinants of Health   Financial Resource Strain: Low Risk  (10/08/2021)   Overall Financial Resource Strain (CARDIA)    Difficulty of Paying Living Expenses:  Not hard at all  Food Insecurity: No Food Insecurity (10/08/2021)   Hunger Vital Sign    Worried About Running Out of Food in the Last Year: Never true    Ran Out of Food in the Last Year: Never true  Transportation Needs: No Transportation Needs (10/08/2021)   PRAPARE - Administrator, Civil Service (Medical): No    Lack of Transportation (Non-Medical): No  Physical Activity: Insufficiently Active (10/08/2021)   Exercise Vital Sign    Days of Exercise per Week: 3 days    Minutes of Exercise per Session: 30 min  Stress: No Stress Concern Present (10/08/2021)   [employer] of Occupational Health - Occupational Stress Questionnaire    Feeling of Stress : Not at all  Social Connections: Moderately Isolated (10/08/2021)   Social Connection and Isolation Panel [NHANES]    Frequency of Communication with Friends and Family: More than three times a week    Frequency of Social Gatherings with Friends and Family: More than three times a week    Attends Religious Services: More than 4 times per year    Active  Member of Clubs or Organizations: No    Attends Banker Meetings: Never    Marital Status: Never married   Outpatient Encounter Medications as of 10/09/2022  Medication Sig   cyclobenzaprine (FLEXERIL) 5 MG tablet Take 1 tablet (5 mg total) by mouth 2 (two) times daily as needed for muscle spasms.   amLODipine (NORVASC) 5 MG tablet Take 5 mg by mouth daily.   brimonidine (ALPHAGAN) 0.15 % ophthalmic solution Place 1 drop into both eyes 3 (three) times daily.   Calcium Carbonate-Vit D-Min (CALCIUM 1200 PO) Take by mouth.   dextromethorphan-guaiFENesin (MUCINEX DM) 30-600 MG 12hr tablet Take 1 tablet by mouth 2 (two) times daily. (Patient not taking: Reported on 04/09/2022)   dorzolamide (TRUSOPT) 2 % ophthalmic solution Place 1 drop into the left eye 2 (two) times daily.   dorzolamide-timolol (COSOPT) 22.3-6.8 MG/ML ophthalmic solution 2 (two) times daily. Left eye only    folic acid (FOLVITE) 1 MG tablet Take 1 mg by mouth daily.    latanoprost (XALATAN) 0.005 % ophthalmic solution Place 1 drop into the left eye at bedtime.   levothyroxine (SYNTHROID) 125 MCG tablet Take 1 tablet (125 mcg total) by mouth daily before breakfast.   lisinopril (ZESTRIL) 10 MG tablet TAKE 1 TABLET(10 MG) BY MOUTH DAILY   LUMIGAN 0.01 % SOLN Place 1 drop into the left eye at bedtime.   Multiple Vitamins-Minerals (MULTIVITAMIN WITH MINERALS) tablet Take 1 tablet by mouth daily.   potassium chloride SA (KLOR-CON M) 20 MEQ tablet TAKE 1 TABLET BY MOUTH EVERY DAY   timolol (TIMOPTIC) 0.5 % ophthalmic solution 1 drop 2 (two) times daily.   torsemide (DEMADEX) 20 MG tablet TAKE 1 TO 2 TABLETS BY MOUTH DAILY AS NEEDED FOR SWELLING   [DISCONTINUED] levothyroxine (SYNTHROID) 150 MCG tablet TAKE 1 TABLET(150 MCG) BY MOUTH DAILY BEFORE BREAKFAST   No facility-administered encounter medications on file as of 10/09/2022.   ALLERGIES: Allergies  Allergen Reactions   Acetazolamide    Dicloxacillin Other (See Comments)    GI upset   VACCINATION STATUS: Immunization History  Administered Date(s) Administered   Fluad Quad(high Dose 65+) 01/13/2019, 01/11/2021, 01/15/2022   Influenza Whole 02/09/2013   Influenza, High Dose Seasonal PF 01/20/2017, 01/27/2018, 01/04/2020   Influenza,inj,Quad PF,6+ Mos 01/15/2015, 01/25/2016   Influenza-Unspecified 01/13/2014   Moderna SARS-COV2 Booster Vaccination 03/21/2020, 12/11/2020   Moderna Sars-Covid-2 Vaccination 06/16/2019, 07/18/2019   PNEUMOCOCCAL CONJUGATE-20 10/26/2021   Pneumococcal Conjugate-13 01/15/2015   Pneumococcal Polysaccharide-23 01/13/2014   Tdap 10/02/2009   Varicella 03/03/2010   Zoster Recombinat (Shingrix) 02/09/2021, 04/10/2021    Thyroid Problem Presents for follow-up visit. Patient reports no anxiety, cold intolerance, constipation, depressed mood, diarrhea, fatigue, hair loss, heat intolerance, palpitations, tremors, weight  gain or weight loss. The symptoms have been stable.    She is on levothyroxine 150 mcg p.o. daily before breakfast.  She reports compliance and consistency with her hormone.  She presents with thyroid function test consistent with over replacement.  She denies palpitations, tremors, nor heat intolerance. - She reports improvement in her previous symptoms of fatigue.  She presents with progressive weight loss, lost 15 pounds over the last year.    she denies dysphagia, SOB, nor voice change.     Review of systems  Constitutional: + Minimally fluctuating body weight,  current Body mass index is 32.15 kg/m. , no fatigue,  no subjective hypothermia    Objective:    BP 118/72   Pulse 80   Ht 5\' 6"  (  1.676 m)   Wt 199 lb 3.2 oz (90.4 kg)   BMI 32.15 kg/m   Wt Readings from Last 3 Encounters:  10/09/22 199 lb 3.2 oz (90.4 kg)  05/19/22 202 lb 9.6 oz (91.9 kg)  04/09/22 204 lb 12.8 oz (92.9 kg)    BP Readings from Last 3 Encounters:  10/09/22 118/72  05/19/22 107/70  04/09/22 138/72      Physical Exam- Limited  Constitutional:  Body mass index is 32.15 kg/m. , not in acute distress, normal state of mind     CMP     Component Value Date/Time   NA 144 12/10/2020 1140   K 4.4 12/10/2020 1140   CL 104 12/10/2020 1140   CO2 25 12/10/2020 1140   GLUCOSE 111 (H) 12/10/2020 1140   GLUCOSE 109 (H) 07/05/2020 1928   BUN 32 (H) 12/10/2020 1140   CREATININE 1.33 (H) 12/10/2020 1140   CREATININE 1.28 (H) 02/10/2020 0926   CALCIUM 9.4 12/10/2020 1140   PROT 7.2 12/10/2020 1140   ALBUMIN 4.5 12/10/2020 1140   AST 27 12/10/2020 1140   ALT 17 12/10/2020 1140   ALKPHOS 118 12/10/2020 1140   BILITOT 0.9 12/10/2020 1140   GFRNONAA 23 (L) 07/05/2020 1928   GFRNONAA 41 (L) 03/28/2019 0744   GFRAA 48 (L) 03/28/2019 0744     Diabetic Labs (most recent): Lab Results  Component Value Date   HGBA1C 5.5 10/09/2022   HGBA1C 5.6 10/08/2021   HGBA1C 5.3 02/10/2020   MICROALBUR  3.6 02/10/2020   MICROALBUR 1.1 11/17/2018   MICROALBUR 0.4 08/09/2018     Lipid Panel ( most recent) Lipid Panel     Component Value Date/Time   CHOL 179 10/01/2022 0821   TRIG 101 10/01/2022 0821   HDL 68 10/01/2022 0821   CHOLHDL 2.6 10/01/2022 0821   CHOLHDL 2.6 02/10/2020 0926   VLDL 17 07/15/2016 0859   LDLCALC 93 10/01/2022 0821   LDLCALC 88 02/10/2020 0926    Recent Results (from the past 2160 hour(s))  TSH     Status: Abnormal   Collection Time: 10/01/22  8:21 AM  Result Value Ref Range   TSH 0.030 (L) 0.450 - 4.500 uIU/mL  T4, free     Status: None   Collection Time: 10/01/22  8:21 AM  Result Value Ref Range   Free T4 1.68 0.82 - 1.77 ng/dL  Lipid panel     Status: None   Collection Time: 10/01/22  8:21 AM  Result Value Ref Range   Cholesterol, Total 179 100 - 199 mg/dL   Triglycerides 161 0 - 149 mg/dL   HDL 68 >09 mg/dL   VLDL Cholesterol Cal 18 5 - 40 mg/dL   LDL Chol Calc (NIH) 93 0 - 99 mg/dL   Chol/HDL Ratio 2.6 0.0 - 4.4 ratio    Comment:                                   T. Chol/HDL Ratio                                             Men  Women  1/2 Avg.Risk  3.4    3.3                                   Avg.Risk  5.0    4.4                                2X Avg.Risk  9.6    7.1                                3X Avg.Risk 23.4   11.0   HgB A1c     Status: None   Collection Time: 10/09/22 12:55 PM  Result Value Ref Range   Hemoglobin A1C     HbA1c POC (<> result, manual entry)     HbA1c, POC (prediabetic range)     HbA1c, POC (controlled diabetic range) 5.5 0.0 - 7.0 %       Assessment & Plan:   1. Hypothyroidism r/t Hashimoto's thyroiditis with left Hemithyroidectomy: -Her previsit thyroid function tests are consistent wit  over replacement.  She is approached for lower dose of levothyroxine.  I discussed and lowered her levothyroxine to 125 mcg p.o. daily before breakfast.     - We discussed about the correct  intake of her thyroid hormone, on empty stomach at fasting, with water, separated by at least 30 minutes from breakfast and other medications,  and separated by more than 4 hours from calcium, iron, multivitamins, acid reflux medications (PPIs). -Patient is made aware of the fact that thyroid hormone replacement is needed for life, dose to be adjusted by periodic monitoring of thyroid function tests.   In light of her history of class 1.  Obesity, prediabetes, no need for medication intervention at this time.  Her point-of-care A1c is 5.5% today.   She has LDL dropping to 93 from 103.  She is not on statins yet.  She is trying to engage with whole food plant-based diet.  Her last thyroid u/s from 01/2017 showed normal size right lobe with no nodules.- no need for additional follow up at this time.   - I advised patient to maintain close follow up with Anabel Halon, MD for primary care needs.    I spent  26  minutes in the care of the patient today including review of labs from Thyroid Function, CMP, and other relevant labs ; imaging/biopsy records (current and previous including abstractions from other facilities); face-to-face time discussing  her lab results and symptoms, medications doses, her options of short and long term treatment based on the latest standards of care / guidelines;   and documenting the encounter.  Santi Benitz [place]th  participated in the discussions, expressed understanding, and voiced agreement with the above plans.  All questions were answered to her satisfaction. she is encouraged to contact clinic should she have any questions or concerns prior to her return visit.   Follow up plan: Return in about 4 months (around 02/08/2023) for F/U with Pre-visit Labs.  Ronny Bacon, [facility] [address] [place], [place] [address] Phone: [phone] Fax: [phone]   10/09/2022, 4:05 PM

## 2022-10-13 ENCOUNTER — Ambulatory Visit (INDEPENDENT_AMBULATORY_CARE_PROVIDER_SITE_OTHER): Payer: Medicare Other

## 2022-10-13 VITALS — BP 104/68 | HR 74 | Wt 200.1 lb

## 2022-10-13 DIAGNOSIS — Z0000 Encounter for general adult medical examination without abnormal findings: Secondary | ICD-10-CM | POA: Diagnosis not present

## 2022-10-13 NOTE — Patient Instructions (Signed)
  Ms. Subramanian , Thank you for taking time to come for your Medicare Wellness Visit. I appreciate your ongoing commitment to your health goals. Please review the following plan we discussed and let me know if I can assist you in the future.   These are the goals we discussed:  Goals      DIET - REDUCE FAT INTAKE     Patient Stated     Would like to travel      Patient Stated     None     Weight (lb) < 200 lb (90.7 kg)        This is a list of the screening recommended for you and due dates:  Health Maintenance  Topic Date Due   DTaP/Tdap/Td vaccine (2 - Td or Tdap) 10/03/2019   COVID-19 Vaccine (3 - Moderna risk series) 01/08/2021   Flu Shot  12/04/2022   Colon Cancer Screening  06/22/2023   Medicare Annual Wellness Visit  10/13/2023   Mammogram  11/12/2023   Pneumonia Vaccine  Completed   DEXA scan (bone density measurement)  Completed   Hepatitis C Screening  Completed   Zoster (Shingles) Vaccine  Completed   HPV Vaccine  Aged Out

## 2022-10-13 NOTE — Progress Notes (Signed)
Subjective:   Danielle Howard is a 72 y.o. female who presents for Medicare Annual (Subsequent) preventive examination.  Review of Systems     Danielle Howard , Thank you for taking time to come for your Medicare Wellness Visit. I appreciate your ongoing commitment to your health goals. Please review the following plan we discussed and let me know if I can assist you in the future.   These are the goals we discussed:  Goals      DIET - REDUCE FAT INTAKE     Patient Stated     Would like to travel      Patient Stated     None     Weight (lb) < 200 lb (90.7 kg)        This is a list of the screening recommended for you and due dates:  Health Maintenance  Topic Date Due   DTaP/Tdap/Td vaccine (2 - Td or Tdap) 10/03/2019   COVID-19 Vaccine (3 - Moderna risk series) 01/08/2021   Flu Shot  12/04/2022   Colon Cancer Screening  06/22/2023   Medicare Annual Wellness Visit  10/13/2023   Mammogram  11/12/2023   Pneumonia Vaccine  Completed   DEXA scan (bone density measurement)  Completed   Hepatitis C Screening  Completed   Zoster (Shingles) Vaccine  Completed   HPV Vaccine  Aged Out          Objective:    Today's Vitals   10/13/22 1033 10/13/22 1037  BP: 104/68   Pulse: 74   SpO2: 98%   Weight: 200 lb 1.9 oz (90.8 kg)   PainSc: 0-No pain 0-No pain   Body mass index is 32.3 kg/m.     10/13/2022   10:39 AM 08/22/2022   10:46 AM 10/08/2021   10:33 AM 10/03/2020    9:49 AM 07/05/2020    6:05 PM 08/08/2019    9:30 AM 06/21/2018    9:16 AM  Advanced Directives  Does Patient Have a Medical Advance Directive? No No No No No No No  Would patient like information on creating a medical advance directive? No - Patient declined No - Patient declined Yes (MAU/Ambulatory/Procedural Areas - Information given) No - Patient declined No - Patient declined No - Patient declined No - Patient declined    Current Medications (verified) Outpatient Encounter Medications as of 10/13/2022   Medication Sig   amLODipine (NORVASC) 5 MG tablet Take 5 mg by mouth daily.   brimonidine (ALPHAGAN) 0.15 % ophthalmic solution Place 1 drop into both eyes 3 (three) times daily.   Calcium Carbonate-Vit D-Min (CALCIUM 1200 PO) Take by mouth.   cyclobenzaprine (FLEXERIL) 5 MG tablet Take 1 tablet (5 mg total) by mouth 2 (two) times daily as needed for muscle spasms.   dextromethorphan-guaiFENesin (MUCINEX DM) 30-600 MG 12hr tablet Take 1 tablet by mouth 2 (two) times daily.   dorzolamide (TRUSOPT) 2 % ophthalmic solution Place 1 drop into the left eye 2 (two) times daily.   dorzolamide-timolol (COSOPT) 22.3-6.8 MG/ML ophthalmic solution 2 (two) times daily. Left eye only   folic acid (FOLVITE) 1 MG tablet Take 1 mg by mouth daily.    latanoprost (XALATAN) 0.005 % ophthalmic solution Place 1 drop into the left eye at bedtime.   levothyroxine (SYNTHROID) 125 MCG tablet Take 1 tablet (125 mcg total) by mouth daily before breakfast.   lisinopril (ZESTRIL) 10 MG tablet TAKE 1 TABLET(10 MG) BY MOUTH DAILY   LUMIGAN 0.01 % SOLN Place  1 drop into the left eye at bedtime.   Multiple Vitamins-Minerals (MULTIVITAMIN WITH MINERALS) tablet Take 1 tablet by mouth daily.   potassium chloride SA (KLOR-CON M) 20 MEQ tablet TAKE 1 TABLET BY MOUTH EVERY DAY   timolol (TIMOPTIC) 0.5 % ophthalmic solution 1 drop 2 (two) times daily.   torsemide (DEMADEX) 20 MG tablet TAKE 1 TO 2 TABLETS BY MOUTH DAILY AS NEEDED FOR SWELLING   No facility-administered encounter medications on file as of 10/13/2022.    Allergies (verified) Acetazolamide and Dicloxacillin   History: Past Medical History:  Diagnosis Date   Arthritis    Cataract    Complication of anesthesia    nausea   Glaucoma    Headache    Hypertension    Hypothyroidism    Hypothyroidism    Iritis    Thyroid disease    Uveitis of left eye 12/29/2017   Past Surgical History:  Procedure Laterality Date   ABDOMINAL HYSTERECTOMY     CATARACT  EXTRACTION Left 2019   CATARACT EXTRACTION Right 06/12/2022   CHOLECYSTECTOMY     02/2012   COLONOSCOPY N/A 05/25/2013   Procedure: COLONOSCOPY;  Surgeon: Malissa Hippo, MD;  Location: [facility];  Service: Endoscopy;  Laterality: N/A;  830-moved to 1055 Ann to notify pt   COLONOSCOPY N/A 06/21/2018   Procedure: COLONOSCOPY;  Surgeon: Malissa Hippo, MD;  Location: [facility];  Service: Endoscopy;  Laterality: N/A;  1020   CYST EXCISION  08/21/2022   EYE SURGERY N/A    [employer] 07/13/2020   JOINT REPLACEMENT Bilateral    2012,2013-bilateral knees   POLYPECTOMY  06/21/2018   Procedure: POLYPECTOMY;  Surgeon: Malissa Hippo, MD;  Location: [facility];  Service: Endoscopy;;   THYROID SURGERY     Family History  Problem Relation Age of Onset   Arthritis Mother    Heart disease Mother    Hypertension Mother    Arthritis Father    Hyperlipidemia Father    HIV/AIDS Brother    Social History   Socioeconomic History   Marital status: Single    Spouse name: Not on file   Number of children: Not on file   Years of education: Not on file   Highest education level: Not on file  Occupational History   Not on file  Tobacco Use   Smoking status: Never   Smokeless tobacco: Never  Vaping Use   Vaping Use: Never used  Substance and Sexual Activity   Alcohol use: Not Currently    Comment: occasional   Drug use: No   Sexual activity: Not Currently  Other Topics Concern   Not on file  Social History Narrative   Not on file   Social Determinants of Health   Financial Resource Strain: Low Risk  (10/09/2022)   Overall Financial Resource Strain (CARDIA)    Difficulty of Paying Living Expenses: Not hard at all  Food Insecurity: No Food Insecurity (10/09/2022)   Hunger Vital Sign    Worried About Running Out of Food in the Last Year: Never true    Ran Out of Food in the Last Year: Never true  Transportation Needs: No Transportation Needs (10/09/2022)   PRAPARE -  Administrator, Civil Service (Medical): No    Lack of Transportation (Non-Medical): No  Physical Activity: Insufficiently Active (10/09/2022)   Exercise Vital Sign    Days of Exercise per Week: 1 day    Minutes of Exercise per Session: 30  min  Stress: No Stress Concern Present (10/09/2022)   [employer] of Occupational Health - Occupational Stress Questionnaire    Feeling of Stress : Not at all  Social Connections: Unknown (10/09/2022)   Social Connection and Isolation Panel [NHANES]    Frequency of Communication with Friends and Family: More than three times a week    Frequency of Social Gatherings with Friends and Family: More than three times a week    Attends Religious Services: Not on Marketing executive or Organizations: Patient declined    Attends Banker Meetings: Patient declined    Marital Status: Patient declined    Tobacco Counseling Counseling given: Not Answered   Clinical Intake:    How often do you need to have someone help you when you read instructions, pamphlets, or other written materials from your doctor or pharmacy?: 1 - Never What is the last grade level you completed in school?: 12+  Diabetic?no  Interpreter Needed?: No  Information entered by :: Selen Smucker   Activities of Daily Living    10/09/2022    9:49 PM  In your present state of health, do you have any difficulty performing the following activities:  Hearing? 0  Vision? 0  Difficulty concentrating or making decisions? 0  Walking or climbing stairs? 0  Dressing or bathing? 0  Doing errands, shopping? 0  Preparing Food and eating ? N  Using the Toilet? N  In the past six months, have you accidently leaked urine? N  Do you have problems with loss of bowel control? N  Managing your Medications? N  Managing your Finances? N  Housekeeping or managing your Housekeeping? N    Patient Care Team: Anabel Halon, MD as PCP - General (Internal  Medicine) Wyline Mood Dorothe Pea, MD as PCP - Cardiology (Cardiology)  Indicate any recent Medical Services you may have received from other than [facility] providers in the past year (date may be approximate).     Assessment:   This is a routine wellness examination for Danielle Howard.  Hearing/Vision screen No results found.  Dietary issues and exercise activities discussed:     Goals Addressed             This Visit's Progress    Patient Stated       None      Depression Screen    10/13/2022   10:40 AM 10/13/2022   10:39 AM 05/19/2022   10:24 AM 11/14/2021   10:42 AM 10/11/2021   10:56 AM 10/08/2021   10:34 AM 10/08/2021   10:31 AM  PHQ 2/9 Scores  PHQ - 2 Score 0 0 0 0 0 0 0    Fall Risk    10/13/2022   10:39 AM 10/09/2022    9:49 PM 05/19/2022   10:24 AM 11/14/2021   10:42 AM 10/11/2021   10:56 AM  Fall Risk   Falls in the past year? 1 0 0 0 0  Number falls in past yr: 0 1 0 0 0  Injury with Fall? 0 0 0 0 0  Risk for fall due to : History of fall(s)   No Fall Risks No Fall Risks  Follow up Falls evaluation completed   Falls evaluation completed Falls evaluation completed    FALL RISK PREVENTION PERTAINING TO THE HOME:  Any stairs in or around the home? No  If so, are there any without handrails? No  Home free of loose throw rugs in walkways,  pet beds, electrical cords, etc? Yes  Adequate lighting in your home to reduce risk of falls? Yes   ASSISTIVE DEVICES UTILIZED TO PREVENT FALLS:  Life alert? No  Use of a cane, walker or w/c? Yes  Grab bars in the bathroom? No  Shower chair or bench in shower? Yes  Elevated toilet seat or a handicapped toilet? Yes   TIMED UP AND GO:  Was the test performed? Yes .  Length of time to ambulate 10 feet: 8 sec.   Gait steady and fast without use of assistive device  Cognitive Function:    10/13/2022   10:41 AM 10/13/2022   10:40 AM 10/08/2021   10:34 AM  MMSE - Mini Mental State Exam  Not completed: Unable to complete Unable to  complete Unable to complete        10/08/2021   10:34 AM  6CIT Screen  What Year? 0 points  What month? 0 points  What time? 0 points  Count back from 20 0 points  Months in reverse 0 points  Repeat phrase 0 points  Total Score 0 points    Immunizations Immunization History  Administered Date(s) Administered   Fluad Quad(high Dose 65+) 01/13/2019, 01/11/2021, 01/15/2022   Influenza Whole 02/09/2013   Influenza, High Dose Seasonal PF 01/20/2017, 01/27/2018, 01/04/2020   Influenza,inj,Quad PF,6+ Mos 01/15/2015, 01/25/2016   Influenza-Unspecified 01/13/2014   Moderna SARS-COV2 Booster Vaccination 03/21/2020, 12/11/2020   Moderna Sars-Covid-2 Vaccination 06/16/2019, 07/18/2019   PNEUMOCOCCAL CONJUGATE-20 10/26/2021   Pneumococcal Conjugate-13 01/15/2015   Pneumococcal Polysaccharide-23 01/13/2014   Tdap 10/02/2009   Varicella 03/03/2010   Zoster Recombinat (Shingrix) 02/09/2021, 04/10/2021    TDAP status: Due, Education has been provided regarding the importance of this vaccine. Advised may receive this vaccine at local pharmacy or Health Dept. Aware to provide a copy of the vaccination record if obtained from local pharmacy or Health Dept. Verbalized acceptance and understanding.  Flu Vaccine status: Up to date  Pneumococcal vaccine status: Up to date  Covid-19 vaccine status: Completed vaccines  Qualifies for Shingles Vaccine? Yes   Zostavax completed Yes   Shingrix Completed?: Yes  Screening Tests Health Maintenance  Topic Date Due   DTaP/Tdap/Td (2 - Td or Tdap) 10/03/2019   COVID-19 Vaccine (3 - Moderna risk series) 01/08/2021   INFLUENZA VACCINE  12/04/2022   Colonoscopy  06/22/2023   Medicare Annual Wellness (AWV)  10/13/2023   MAMMOGRAM  11/12/2023   Pneumonia Vaccine 2+ Years old  Completed   DEXA SCAN  Completed   Hepatitis C Screening  Completed   Zoster Vaccines- Shingrix  Completed   HPV VACCINES  Aged Out    Health Maintenance  Health  Maintenance Due  Topic Date Due   DTaP/Tdap/Td (2 - Td or Tdap) 10/03/2019   COVID-19 Vaccine (3 - Moderna risk series) 01/08/2021    Colorectal cancer screening: Type of screening: Colonoscopy. Completed 06/21/2018. Repeat every 5 years  Mammogram status: Completed 11/11/21. Repeat every year  Bone Density status: Completed 10/04/14. Results reflect: Bone density results: NORMAL. Repeat every 2 years.  Lung Cancer Screening: (Low Dose CT Chest recommended if Age 42-80 years, 30 pack-year currently smoking OR have quit w/in 15years.) does not qualify.   Lung Cancer Screening Referral: no  Additional Screening:  Hepatitis C Screening: does qualify; Completed 05/30/15  Vision Screening: Recommended annual ophthalmology exams for early detection of glaucoma and other disorders of the eye. Is the patient up to date with their annual eye exam?  Yes  Who is the provider or what is the name of the office in which the patient attends annual eye exams? Dr Zachery Dauer If pt is not established with a provider, would they like to be referred to a provider to establish care? No .   Dental Screening: Recommended annual dental exams for proper oral hygiene  Community Resource Referral / Chronic Care Management: CRR required this visit?  No   CCM required this visit?  No      Plan:     I have personally reviewed and noted the following in the patient's chart:   Medical and social history Use of alcohol, tobacco or illicit drugs  Current medications and supplements including opioid prescriptions. Patient is not currently taking opioid prescriptions. Functional ability and status Nutritional status Physical activity Advanced directives List of other physicians Hospitalizations, surgeries, and ER visits in previous 12 months Vitals Screenings to include cognitive, depression, and falls Referrals and appointments  In addition, I have reviewed and discussed with patient certain preventive  protocols, quality metrics, and best practice recommendations. A written personalized care plan for preventive services as well as general preventive health recommendations were provided to patient.     Jasper Riling, CMA   10/13/2022

## 2022-10-15 ENCOUNTER — Other Ambulatory Visit (HOSPITAL_COMMUNITY): Payer: Self-pay | Admitting: Internal Medicine

## 2022-10-15 DIAGNOSIS — Z1231 Encounter for screening mammogram for malignant neoplasm of breast: Secondary | ICD-10-CM

## 2022-10-28 ENCOUNTER — Other Ambulatory Visit (HOSPITAL_COMMUNITY): Payer: Self-pay | Admitting: Orthopedic Surgery

## 2022-10-28 DIAGNOSIS — M25512 Pain in left shoulder: Secondary | ICD-10-CM

## 2022-11-19 ENCOUNTER — Encounter: Payer: Self-pay | Admitting: Internal Medicine

## 2022-11-19 ENCOUNTER — Ambulatory Visit (INDEPENDENT_AMBULATORY_CARE_PROVIDER_SITE_OTHER): Payer: Medicare Other | Admitting: Internal Medicine

## 2022-11-19 VITALS — BP 118/71 | HR 78 | Ht 66.0 in | Wt 200.4 lb

## 2022-11-19 DIAGNOSIS — M5030 Other cervical disc degeneration, unspecified cervical region: Secondary | ICD-10-CM | POA: Diagnosis not present

## 2022-11-19 DIAGNOSIS — R7303 Prediabetes: Secondary | ICD-10-CM

## 2022-11-19 DIAGNOSIS — I10 Essential (primary) hypertension: Secondary | ICD-10-CM | POA: Diagnosis not present

## 2022-11-19 DIAGNOSIS — E669 Obesity, unspecified: Secondary | ICD-10-CM

## 2022-11-19 DIAGNOSIS — I739 Peripheral vascular disease, unspecified: Secondary | ICD-10-CM

## 2022-11-19 DIAGNOSIS — H35033 Hypertensive retinopathy, bilateral: Secondary | ICD-10-CM

## 2022-11-19 DIAGNOSIS — H4042X3 Glaucoma secondary to eye inflammation, left eye, severe stage: Secondary | ICD-10-CM

## 2022-11-19 DIAGNOSIS — E890 Postprocedural hypothyroidism: Secondary | ICD-10-CM

## 2022-11-19 DIAGNOSIS — N1831 Chronic kidney disease, stage 3a: Secondary | ICD-10-CM

## 2022-11-19 NOTE — Assessment & Plan Note (Signed)
On Levothyroxine 125 mcg QD Follows up with Endocrinology - Dr Fransico Him

## 2022-11-19 NOTE — Assessment & Plan Note (Signed)
BP Readings from Last 1 Encounters:  11/19/22 118/71   Well-controlled with lisinopril and amlodipine now Followed by Nephrology Counseled for compliance with the medications Advised DASH diet and moderate exercise/walking as tolerated

## 2022-11-19 NOTE — Assessment & Plan Note (Signed)
Last BMP reviewed - GFR improved to 63 She has stopped taking soft drinks cyclosporine and acyclovir discontinued Advised to maintain proper hydration Avoid nephrotoxic agents Followed by nephrology

## 2022-11-19 NOTE — Progress Notes (Signed)
Established Patient Office Visit  Subjective:  Patient ID: Danielle Howard, female    DOB: 09/26/1950  Age: 72 y.o. MRN: [id number]  CC:  Chief Complaint  Patient presents with   Hypertension    Six month follow up     HPI Danielle Howard is a 72 y.o. female with past medical history of HTN, PVD, hypothyroidism, gluacoma, uveitis and obesity who presents for f/u of her HTN.  HTN: Her BP is well controlled today.  She takes lisinopril 10 mg daily and amlodipine 5 mg daily.  She denies any headache, dizziness, chest pain, dyspnea or palpitations.  CKD: Her GFR has improved to 63 now.  She has stopped taking cyclosporine, which was prescribed by her ophthalmologist for uveitis.  She denies any dysuria, hematuria or urinary hesitancy or resistance.  Hypothyroidism: She takes levothyroxine 125 mcg QD. Followed by Dr. Fransico Him.  She complains of pain around left side of the neck and left shoulder.  Her pain is dull, intermittent, worse in the morning, and is nonradiating.  She admits that she still sleeps on the recliner chair sometimes while watching TV.  Denies any numbness or tingling of the UE.   Past Medical History:  Diagnosis Date   Arthritis    Cataract    Complication of anesthesia    nausea   Glaucoma    Headache    Hypertension    Hypothyroidism    Hypothyroidism    Iritis    Thyroid disease    Uveitis of left eye 12/29/2017    Past Surgical History:  Procedure Laterality Date   ABDOMINAL HYSTERECTOMY     CATARACT EXTRACTION Left 2019   CATARACT EXTRACTION Right 06/12/2022   CHOLECYSTECTOMY     02/2012   COLONOSCOPY N/A 05/25/2013   Procedure: COLONOSCOPY;  Surgeon: Malissa Hippo, MD;  [facility]: [facility];  Service: Endoscopy;  Laterality: N/A;  830-moved to 1055 Ann to notify pt   COLONOSCOPY N/A 06/21/2018   Procedure: COLONOSCOPY;  Surgeon: Malissa Hippo, MD;  Location: [facility];  Service: Endoscopy;  Laterality: N/A;  1020   CYST EXCISION   08/21/2022   EYE SURGERY N/A    [employer] 07/13/2020   JOINT REPLACEMENT Bilateral    2012,2013-bilateral knees   POLYPECTOMY  06/21/2018   Procedure: POLYPECTOMY;  Surgeon: Malissa Hippo, MD;  Location: [facility];  Service: Endoscopy;;   THYROID SURGERY      Family History  Problem Relation Age of Onset   Arthritis Mother    Heart disease Mother    Hypertension Mother    Arthritis Father    Hyperlipidemia Father    HIV/AIDS Brother     Social History   Socioeconomic History   Marital status: Single    Spouse name: Not on file   Number of children: Not on file   Years of education: Not on file   Highest education level: Not on file  Occupational History   Not on file  Tobacco Use   Smoking status: Never   Smokeless tobacco: Never  Vaping Use   Vaping status: Never Used  Substance and Sexual Activity   Alcohol use: Not Currently    Comment: occasional   Drug use: No   Sexual activity: Not Currently  Other Topics Concern   Not on file  Social History Narrative   Not on file   Social Determinants of Health   Financial Resource Strain: Low Risk  (10/09/2022)   Overall  Financial Resource Strain (CARDIA)    Difficulty of Paying Living Expenses: Not hard at all  Food Insecurity: No Food Insecurity (10/09/2022)   Hunger Vital Sign    Worried About Running Out of Food in the Last Year: Never true    Ran Out of Food in the Last Year: Never true  Transportation Needs: No Transportation Needs (10/09/2022)   PRAPARE - Administrator, Civil Service (Medical): No    Lack of Transportation (Non-Medical): No  Physical Activity: Insufficiently Active (10/09/2022)   Exercise Vital Sign    Days of Exercise per Week: 1 day    Minutes of Exercise per Session: 30 min  Stress: No Stress Concern Present (10/09/2022)   [employer] of Occupational Health - Occupational Stress Questionnaire    Feeling of Stress : Not at all  Social Connections: Unknown (10/09/2022)    Social Connection and Isolation Panel [NHANES]    Frequency of Communication with Friends and Family: More than three times a week    Frequency of Social Gatherings with Friends and Family: More than three times a week    Attends Religious Services: Not on file    Active Member of Clubs or Organizations: Patient declined    Attends Banker Meetings: Patient declined    Marital Status: Patient declined  Intimate Partner Violence: Not At Risk (10/08/2021)   Humiliation, Afraid, Rape, and Kick questionnaire    Fear of Current or Ex-Partner: No    Emotionally Abused: No    Physically Abused: No    Sexually Abused: No    Outpatient Medications Prior to Visit  Medication Sig Dispense Refill   amLODipine (NORVASC) 5 MG tablet Take 5 mg by mouth daily.     brimonidine (ALPHAGAN) 0.15 % ophthalmic solution Place 1 drop into both eyes 3 (three) times daily. 5 mL 12   Calcium Carbonate-Vit D-Min (CALCIUM 1200 PO) Take by mouth.     cyclobenzaprine (FLEXERIL) 5 MG tablet Take 1 tablet (5 mg total) by mouth 2 (two) times daily as needed for muscle spasms. 30 tablet 1   dextromethorphan-guaiFENesin (MUCINEX DM) 30-600 MG 12hr tablet Take 1 tablet by mouth 2 (two) times daily.     dorzolamide (TRUSOPT) 2 % ophthalmic solution Place 1 drop into the left eye 2 (two) times daily.     folic acid (FOLVITE) 1 MG tablet Take 1 mg by mouth daily.      latanoprost (XALATAN) 0.005 % ophthalmic solution Place 1 drop into the left eye at bedtime.     levothyroxine (SYNTHROID) 125 MCG tablet Take 1 tablet (125 mcg total) by mouth daily before breakfast. 90 tablet 1   lisinopril (ZESTRIL) 10 MG tablet TAKE 1 TABLET(10 MG) BY MOUTH DAILY 90 tablet 2   LUMIGAN 0.01 % SOLN Place 1 drop into the left eye at bedtime.     Multiple Vitamins-Minerals (MULTIVITAMIN WITH MINERALS) tablet Take 1 tablet by mouth daily.     potassium chloride SA (KLOR-CON M) 20 MEQ tablet TAKE 1 TABLET BY MOUTH EVERY DAY 90 tablet 0    timolol (TIMOPTIC) 0.5 % ophthalmic solution 1 drop 2 (two) times daily.     torsemide (DEMADEX) 20 MG tablet TAKE 1 TO 2 TABLETS BY MOUTH DAILY AS NEEDED FOR SWELLING 180 tablet 2   dorzolamide-timolol (COSOPT) 22.3-6.8 MG/ML ophthalmic solution 2 (two) times daily. Left eye only     No facility-administered medications prior to visit.    Allergies  Allergen Reactions  Acetazolamide    Dicloxacillin Other (See Comments)    GI upset    ROS Review of Systems  Constitutional:  Negative for chills and fever.  HENT:  Negative for congestion, sinus pressure, sinus pain and sore throat.   Eyes:  Negative for pain and discharge.  Respiratory:  Negative for cough and shortness of breath.   Cardiovascular:  Positive for leg swelling. Negative for chest pain and palpitations.  Gastrointestinal:  Negative for abdominal pain, constipation, diarrhea, nausea and vomiting.  Endocrine: Negative for polydipsia and polyuria.  Genitourinary:  Negative for dysuria and hematuria.  Musculoskeletal:  Positive for neck pain. Negative for neck stiffness.  Skin:  Negative for rash.  Neurological:  Negative for dizziness and weakness.  Psychiatric/Behavioral:  Negative for agitation and behavioral problems.       Objective:    Physical Exam Vitals reviewed.  Constitutional:      General: She is not in acute distress.    Appearance: She is not diaphoretic.  HENT:     Head: Normocephalic and atraumatic.     Nose: Nose normal.     Mouth/Throat:     Mouth: Mucous membranes are moist.  Eyes:     General: No scleral icterus.    Extraocular Movements: Extraocular movements intact.  Cardiovascular:     Rate and Rhythm: Normal rate and regular rhythm.     Pulses: Normal pulses.     Heart sounds: Normal heart sounds. No murmur heard. Pulmonary:     Breath sounds: Normal breath sounds. No wheezing or rales.  Musculoskeletal:     Cervical back: Neck supple. Tenderness present.     Right lower leg:  Edema (1+) present.     Left lower leg: Edema (1+) present.  Skin:    General: Skin is warm.     Findings: No rash.  Neurological:     General: No focal deficit present.     Mental Status: She is alert and oriented to person, place, and time.  Psychiatric:        Mood and Affect: Mood normal.        Behavior: Behavior normal.     BP 118/71 (BP Location: Right Arm, Patient Position: Sitting, Cuff Size: Normal)   Pulse 78   Ht 5\' 6"  (1.676 m)   Wt 200 lb 6.4 oz (90.9 kg)   SpO2 96%   BMI 32.35 kg/m  Wt Readings from Last 3 Encounters:  11/19/22 200 lb 6.4 oz (90.9 kg)  10/13/22 200 lb 1.9 oz (90.8 kg)  10/09/22 199 lb 3.2 oz (90.4 kg)    Lab Results  Component Value Date   TSH 0.030 (L) 10/01/2022   Lab Results  Component Value Date   WBC 4.3 12/10/2020   HGB 12.5 12/10/2020   HCT 38.3 12/10/2020   MCV 99 (H) 12/10/2020   PLT 310 12/10/2020   Lab Results  Component Value Date   NA 144 12/10/2020   K 4.4 12/10/2020   CO2 25 12/10/2020   GLUCOSE 111 (H) 12/10/2020   BUN 32 (H) 12/10/2020   CREATININE 1.33 (H) 12/10/2020   BILITOT 0.9 12/10/2020   ALKPHOS 118 12/10/2020   AST 27 12/10/2020   ALT 17 12/10/2020   PROT 7.2 12/10/2020   ALBUMIN 4.5 12/10/2020   CALCIUM 9.4 12/10/2020   ANIONGAP 12 07/05/2020   EGFR 43 (L) 12/10/2020   Lab Results  Component Value Date   CHOL 179 10/01/2022   Lab Results  Component Value Date  HDL 68 10/01/2022   Lab Results  Component Value Date   LDLCALC 93 10/01/2022   Lab Results  Component Value Date   TRIG 101 10/01/2022   Lab Results  Component Value Date   CHOLHDL 2.6 10/01/2022   Lab Results  Component Value Date   HGBA1C 5.5 10/09/2022      Assessment & Plan:   Problem List Items Addressed This Visit       Cardiovascular and Mediastinum   Essential hypertension, benign    BP Readings from Last 1 Encounters:  11/19/22 118/71   Well-controlled with lisinopril and amlodipine now Followed by  Nephrology Counseled for compliance with the medications Advised DASH diet and moderate exercise/walking as tolerated      PVD (peripheral vascular disease) (HCC) - Primary    Likely contributing to leg swelling Takes Demadex PRN Leg elevation and compression socks as tolerated        Endocrine   Hypothyroidism    On Levothyroxine 125 mcg QD Follows up with Endocrinology - Dr Fransico Him        Musculoskeletal and Integument   DDD (degenerative disc disease), cervical    Chronic neck pain, likely due to DDD of cervical spine Needs to avoid prolonged bending and avoid sleeping in the recliner chair Tylenol PRN for pain If persistent, will get imaging Has Flexeril PRN for muscle spasms        Genitourinary   Chronic kidney disease (CKD), stage III (moderate) (HCC)    Last BMP reviewed - GFR improved to 63 She has stopped taking soft drinks cyclosporine and acyclovir discontinued Advised to maintain proper hydration Avoid nephrotoxic agents Followed by nephrology        Other   Obesity (BMI 30-39.9)    Diet modification and activity as tolerated      Prediabetes    Lab Results  Component Value Date   HGBA1C 5.5 10/09/2022   Advised to follow low-carb diet for now      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    Follow-up: Return in about 6 months (around 05/22/2023) for Annual physical.    Anabel Halon, MD

## 2022-11-19 NOTE — Patient Instructions (Signed)
Please continue to take medications as prescribed. ? ?Please continue to follow low salt diet and ambulate as tolerated. ?

## 2022-11-19 NOTE — Assessment & Plan Note (Signed)
 Diet modification and activity as tolerated

## 2022-11-20 ENCOUNTER — Ambulatory Visit (HOSPITAL_COMMUNITY)
Admission: RE | Admit: 2022-11-20 | Discharge: 2022-11-20 | Disposition: A | Discharge status: Home / Self Care | Payer: Medicare Other | Source: Ambulatory Visit | Attending: Internal Medicine | Admitting: Internal Medicine

## 2022-11-20 DIAGNOSIS — Z1231 Encounter for screening mammogram for malignant neoplasm of breast: Secondary | ICD-10-CM | POA: Insufficient documentation

## 2022-11-21 DIAGNOSIS — M5030 Other cervical disc degeneration, unspecified cervical region: Secondary | ICD-10-CM | POA: Insufficient documentation

## 2022-11-21 NOTE — Assessment & Plan Note (Signed)
Followed by Ophthalmology - Dr Sherryll Burger

## 2022-11-21 NOTE — Assessment & Plan Note (Signed)
Truspot and Xalatan eye drops Has multiple eye drops for h/o uveitis as well Continue to follow up with Ophthalmology

## 2022-11-21 NOTE — Assessment & Plan Note (Signed)
Chronic neck pain, likely due to DDD of cervical spine Needs to avoid prolonged bending and avoid sleeping in the recliner chair Tylenol PRN for pain If persistent, will get imaging Has Flexeril PRN for muscle spasms

## 2022-11-21 NOTE — Assessment & Plan Note (Addendum)
Likely contributing to leg swelling Takes Demadex PRN Leg elevation and compression socks as tolerated

## 2022-11-21 NOTE — Assessment & Plan Note (Signed)
Lab Results  Component Value Date   HGBA1C 5.5 10/09/2022   Advised to follow low-carb diet for now

## 2022-11-24 ENCOUNTER — Ambulatory Visit (HOSPITAL_COMMUNITY)
Admission: RE | Admit: 2022-11-24 | Discharge: 2022-11-24 | Disposition: A | Discharge status: Home / Self Care | Payer: Medicare Other | Source: Ambulatory Visit | Attending: Orthopedic Surgery | Admitting: Orthopedic Surgery

## 2022-11-24 DIAGNOSIS — M25512 Pain in left shoulder: Secondary | ICD-10-CM | POA: Insufficient documentation

## 2022-11-24 DIAGNOSIS — M7582 Other shoulder lesions, left shoulder: Secondary | ICD-10-CM | POA: Diagnosis not present

## 2022-12-02 DIAGNOSIS — M25512 Pain in left shoulder: Secondary | ICD-10-CM | POA: Diagnosis not present

## 2022-12-17 ENCOUNTER — Encounter: Payer: Self-pay | Admitting: Emergency Medicine

## 2022-12-17 ENCOUNTER — Ambulatory Visit
Admission: EM | Admit: 2022-12-17 | Discharge: 2022-12-17 | Disposition: A | Discharge status: Home / Self Care | Payer: Medicare Other | Attending: Family Medicine | Admitting: Family Medicine

## 2022-12-17 DIAGNOSIS — U071 COVID-19: Secondary | ICD-10-CM | POA: Diagnosis not present

## 2022-12-17 DIAGNOSIS — J029 Acute pharyngitis, unspecified: Secondary | ICD-10-CM | POA: Diagnosis present

## 2022-12-17 DIAGNOSIS — J069 Acute upper respiratory infection, unspecified: Secondary | ICD-10-CM

## 2022-12-17 DIAGNOSIS — R059 Cough, unspecified: Secondary | ICD-10-CM | POA: Diagnosis present

## 2022-12-17 LAB — POCT RAPID STREP A (OFFICE): Rapid Strep A Screen: NEGATIVE

## 2022-12-17 MED ORDER — LIDOCAINE VISCOUS HCL 2 % MT SOLN: 10.0000 mL | OROMUCOSAL | 0 refills | Status: DC | PRN

## 2022-12-17 MED ORDER — PROMETHAZINE-DM 6.25-15 MG/5ML PO SYRP: 5.0000 mL | ORAL_SOLUTION | Freq: Four times a day (QID) | ORAL | 0 refills | Status: DC | PRN

## 2022-12-17 NOTE — ED Triage Notes (Signed)
Cough, sore throat since yesterday.  Has taken coricidin HBP without relief

## 2022-12-17 NOTE — ED Provider Notes (Signed)
[facility]    CSN: [id number] Arrival date & time: 12/17/22  0803      History   Chief Complaint No chief complaint on file.   HPI Danielle Howard is a 72 y.o. female.   Patient presenting today with 1 day history of cough, sore throat, fatigue.  Denies fever, chills, body aches, chest pain, shortness of breath, abdominal pain, nausea vomiting or diarrhea.  Taking Coricidin HBP with minimal relief.  No known sick contacts recently.  No known history of chronic pulmonary disease.    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   Candidiasis, intertrigo 09/11/2021   Lymphedema 12/10/2020   Lipoma of right upper extremity 12/10/2020   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, severe stage 07/04/2020   Chronic kidney disease (CKD), stage III (moderate) (HCC) 04/04/2019   Gout 10/18/2018   Mixed hyperlipidemia 07/30/2018   Hx of colonic polyps 05/20/2018   Acute iritis, left eye 04/07/2018   Macular pucker, left eye 12/29/2017   Nuclear sclerotic cataract of right eye 12/29/2017   Uveitis 12/29/2017   Hypertensive retinopathy 12/29/2017   PVD (peripheral vascular disease) (HCC) 11/24/2016   Osteoarthritis, hand 10/01/2016   Hypothyroidism 01/23/2016   Prediabetes 03/07/2013   Hashimoto's thyroiditis 01/03/2013   Obesity (BMI 30-39.9) 01/03/2013   Essential hypertension, benign 01/03/2013   Unilateral primary osteoarthritis, right knee 08/11/2011    Past  Surgical History:  Procedure Laterality Date   ABDOMINAL HYSTERECTOMY     CATARACT EXTRACTION Left 2019   CATARACT EXTRACTION Right 06/12/2022   CHOLECYSTECTOMY     02/2012   COLONOSCOPY N/A 05/25/2013   Procedure: COLONOSCOPY;  Surgeon: Malissa Hippo, MD;  [facility]: [facility];  Service: Endoscopy;  Laterality: N/A;  830-moved to 1055 Ann to notify pt   COLONOSCOPY N/A 06/21/2018   Procedure: COLONOSCOPY;  Surgeon: Malissa Hippo, MD;  [facility];  Service: Endoscopy;  Laterality: N/A;  1020   CYST EXCISION  08/21/2022   EYE SURGERY N/A    [employer] 07/13/2020   JOINT REPLACEMENT Bilateral    2012,2013-bilateral knees   POLYPECTOMY  06/21/2018   Procedure: POLYPECTOMY;  Surgeon: Malissa Hippo, MD;  [facility]: [facility];  Service: Endoscopy;;   THYROID SURGERY      OB History   No obstetric history on file.      Home Medications    Prior to Admission medications   Medication Sig Start Date End Date Taking? Authorizing Provider  lidocaine (XYLOCAINE) 2 % solution Use as directed 10 mLs in the mouth or throat every 3 (three) hours as needed. 12/17/22  Yes Particia Nearing, PA-C  promethazine-dextromethorphan (PROMETHAZINE-DM) 6.25-15 MG/5ML syrup Take 5 mLs by mouth 4 (four) times daily as needed. 12/17/22  Yes Particia Nearing, PA-C  amLODipine (NORVASC) 5 MG tablet Take 5 mg by mouth daily.  [provider]  brimonidine (ALPHAGAN) 0.15 % ophthalmic solution Place 1 drop into both eyes 3 (three) times daily. 02/10/20   Salley Scarlet, MD  Calcium Carbonate-Vit D-Min (CALCIUM 1200 PO) Take by mouth.    [provider]  cyclobenzaprine (FLEXERIL) 5 MG tablet Take 1 tablet (5 mg total) by mouth 2 (two) times daily as needed for muscle spasms. 05/21/22   Anabel Halon, MD  dorzolamide (TRUSOPT) 2 % ophthalmic solution Place 1 drop into the left eye 2 (two) times daily.    [provider]  folic acid (FOLVITE) 1 MG  tablet Take 1 mg by mouth daily.  01/25/18   [provider]  latanoprost (XALATAN) 0.005 % ophthalmic solution Place 1 drop into the left eye at bedtime. 04/15/22   [provider]  levothyroxine (SYNTHROID) 125 MCG tablet Take 1 tablet (125 mcg total) by mouth daily before breakfast. 10/09/22   Nida, Denman George, MD  lisinopril (ZESTRIL) 10 MG tablet TAKE 1 TABLET(10 MG) BY MOUTH DAILY 06/23/22   Anabel Halon, MD  LUMIGAN 0.01 % SOLN Place 1 drop into the left eye at bedtime. 03/23/20   [provider]  Multiple Vitamins-Minerals (MULTIVITAMIN WITH MINERALS) tablet Take 1 tablet by mouth daily.    [provider]  potassium chloride SA (KLOR-CON M) 20 MEQ tablet TAKE 1 TABLET BY MOUTH EVERY DAY 08/11/22   Anabel Halon, MD  timolol (TIMOPTIC) 0.5 % ophthalmic solution 1 drop 2 (two) times daily.    [provider]  torsemide (DEMADEX) 20 MG tablet TAKE 1 TO 2 TABLETS BY MOUTH DAILY AS NEEDED FOR SWELLING 11/14/21   Anabel Halon, MD    Family History Family History  Problem Relation Age of Onset   Arthritis Mother    Heart disease Mother    Hypertension Mother    Arthritis Father    Hyperlipidemia Father    HIV/AIDS Brother     Social History Social History   Tobacco Use   Smoking status: Never   Smokeless tobacco: Never  Vaping Use   Vaping status: Never Used  Substance Use Topics   Alcohol use: Not Currently    Comment: occasional   Drug use: No     Allergies   Acetazolamide and Dicloxacillin   Review of Systems Review of Systems Per HPI  Physical Exam Triage Vital Signs ED Triage Vitals  Encounter Vitals Group     BP 12/17/22 0819 123/64     Systolic BP Percentile --      Diastolic BP Percentile --      Pulse Rate 12/17/22 0819 (!) 123     Resp 12/17/22 0819 18     Temp 12/17/22 0819 99.3 F (37.4 C)     Temp src --      SpO2 12/17/22 0819 100 %     Weight --      Height --      Head Circumference --       Peak Flow --      Pain Score 12/17/22 0818 8     Pain Loc --      Pain Education --      Exclude from Growth Chart --    No data found.  Updated Vital Signs BP 123/64 (BP Location: Right Arm)   Pulse (!) 123   Temp 99.3 F (37.4 C)   Resp 18   SpO2 100%   Visual Acuity Right Eye Distance:   Left  Eye Distance:   Bilateral Distance:    Right Eye Near:   Left Eye Near:    Bilateral Near:     Physical Exam Vitals and nursing note reviewed.  Constitutional:      Appearance: Normal appearance.  HENT:     Head: Atraumatic.     Right Ear: Tympanic membrane and external ear normal.     Left Ear: Tympanic membrane and external ear normal.     Nose: Rhinorrhea present.     Mouth/Throat:     Mouth: Mucous membranes are moist.     Pharynx: Posterior oropharyngeal erythema present.  Eyes:     Extraocular Movements: Extraocular movements intact.     Conjunctiva/sclera: Conjunctivae normal.  Cardiovascular:     Rate and Rhythm: Normal rate and regular rhythm.     Heart sounds: Normal heart sounds.  Pulmonary:     Effort: Pulmonary effort is normal.     Breath sounds: Normal breath sounds. No wheezing or rales.  Musculoskeletal:        General: Normal range of motion.     Cervical back: Normal range of motion and neck supple.  Skin:    General: Skin is warm and dry.  Neurological:     Mental Status: She is alert and oriented to person, place, and time.  Psychiatric:        Mood and Affect: Mood normal.        Thought Content: Thought content normal.      UC Treatments / Results  Labs (all labs ordered are listed, but only abnormal results are displayed) Labs Reviewed  SARS CORONAVIRUS 2 (TAT 6-24 HRS)  POCT RAPID STREP A (OFFICE)    EKG   Radiology No results found.  Procedures Procedures (including critical care time)  Medications Ordered in UC Medications - No data to display  Initial Impression / Assessment and Plan / UC Course  I have reviewed the  triage vital signs and the nursing notes.  Pertinent labs & imaging results that were available during my care of the patient were reviewed by me and considered in my medical decision making (see chart for details).     Vitals and exam reassuring today, mildly tachycardic in triage likely secondary to viral illness.  COVID testing pending, rapid strep negative.  Good candidate for Paxlovid if positive for COVID.  Discussed supportive over-the-counter medications and home care in addition to viscous lidocaine, Phenergan DM.  Return for worsening symptoms.  Final Clinical Impressions(s) / UC Diagnoses   Final diagnoses:  Viral URI with cough     Discharge Instructions      Someone will call tomorrow if your COVID test came back positive.  I have sent over some medications to help with your symptoms and you may continue taking the Coricidin HBP, Flonase, plain Mucinex, saline sinus rinses.    ED Prescriptions     Medication Sig Dispense Auth. Provider   lidocaine (XYLOCAINE) 2 % solution Use as directed 10 mLs in the mouth or throat every 3 (three) hours as needed. 100 mL Particia Nearing, PA-C   promethazine-dextromethorphan (PROMETHAZINE-DM) 6.25-15 MG/5ML syrup Take 5 mLs by mouth 4 (four) times daily as needed. 100 mL Particia Nearing, [place]      PDMP not reviewed this encounter.   Particia Nearing, [place] 12/17/22 1002

## 2022-12-17 NOTE — Discharge Instructions (Signed)
Someone will call tomorrow if your COVID test came back positive.  I have sent over some medications to help with your symptoms and you may continue taking the Coricidin HBP, Flonase, plain Mucinex, saline sinus rinses.

## 2022-12-18 ENCOUNTER — Telehealth (HOSPITAL_COMMUNITY): Payer: Self-pay | Admitting: Emergency Medicine

## 2022-12-18 LAB — SARS CORONAVIRUS 2 (TAT 6-24 HRS): SARS Coronavirus 2: POSITIVE — AB

## 2022-12-18 MED ORDER — NIRMATRELVIR/RITONAVIR (PAXLOVID)TABLET: 3.0000 | ORAL_TABLET | Freq: Two times a day (BID) | ORAL | 0 refills | Status: AC

## 2023-01-03 ENCOUNTER — Other Ambulatory Visit: Payer: Self-pay | Admitting: "Endocrinology

## 2023-01-09 DIAGNOSIS — Z23 Encounter for immunization: Secondary | ICD-10-CM | POA: Diagnosis not present

## 2023-02-03 DIAGNOSIS — E890 Postprocedural hypothyroidism: Secondary | ICD-10-CM | POA: Diagnosis not present

## 2023-02-04 LAB — T4, FREE: Free T4: 1.55 ng/dL (ref 0.82–1.77)

## 2023-02-04 LAB — TSH: TSH: 0.851 u[IU]/mL (ref 0.450–4.500)

## 2023-02-09 DIAGNOSIS — D631 Anemia in chronic kidney disease: Secondary | ICD-10-CM | POA: Diagnosis not present

## 2023-02-09 DIAGNOSIS — R809 Proteinuria, unspecified: Secondary | ICD-10-CM | POA: Diagnosis not present

## 2023-02-09 DIAGNOSIS — N189 Chronic kidney disease, unspecified: Secondary | ICD-10-CM | POA: Diagnosis not present

## 2023-02-10 ENCOUNTER — Ambulatory Visit (INDEPENDENT_AMBULATORY_CARE_PROVIDER_SITE_OTHER): Payer: Medicare Other | Admitting: "Endocrinology

## 2023-02-10 ENCOUNTER — Encounter: Payer: Self-pay | Admitting: "Endocrinology

## 2023-02-10 VITALS — BP 116/58 | HR 60 | Ht 66.0 in | Wt 202.2 lb

## 2023-02-10 DIAGNOSIS — E890 Postprocedural hypothyroidism: Secondary | ICD-10-CM

## 2023-02-10 DIAGNOSIS — E782 Mixed hyperlipidemia: Secondary | ICD-10-CM

## 2023-02-10 DIAGNOSIS — R7303 Prediabetes: Secondary | ICD-10-CM

## 2023-02-10 NOTE — Progress Notes (Signed)
02/10/2023                       Endocrinology Follow Up Note    Subjective:    Patient ID: Danielle Howard, female    DOB: February 02, 1951, PCP Anabel Halon, MD   Past Medical History:  Diagnosis Date   Arthritis    Cataract    Complication of anesthesia    nausea   Glaucoma    Headache    Hypertension    Hypothyroidism    Hypothyroidism    Iritis    Thyroid disease    Uveitis of left eye 12/29/2017   Past Surgical History:  Procedure Laterality Date   ABDOMINAL HYSTERECTOMY     CATARACT EXTRACTION Left 2019   CATARACT EXTRACTION Right 06/12/2022   CHOLECYSTECTOMY     02/2012   COLONOSCOPY N/A 05/25/2013   Procedure: COLONOSCOPY;  Surgeon: Malissa Hippo, MD;  Location: [facility];  Service: Endoscopy;  Laterality: N/A;  830-moved to 1055 Ann to notify pt   COLONOSCOPY N/A 06/21/2018   Procedure: COLONOSCOPY;  Surgeon: Malissa Hippo, MD;  Location: [facility];  Service: Endoscopy;  Laterality: N/A;  1020   CYST EXCISION  08/21/2022   EYE SURGERY N/A    [employer] 07/13/2020   JOINT REPLACEMENT Bilateral    2012,2013-bilateral knees   POLYPECTOMY  06/21/2018   Procedure: POLYPECTOMY;  Surgeon: Malissa Hippo, MD;  Location: [facility];  Service: Endoscopy;;   THYROID SURGERY     Social History   Socioeconomic History   Marital status: Single    Spouse name: Not on file   Number of children: Not on file   Years of education: Not on file   Highest education level: Not on file  Occupational History   Not on file  Tobacco Use   Smoking status: Never   Smokeless tobacco: Never  Vaping Use   Vaping status: Never Used  Substance and Sexual Activity   Alcohol use: Not Currently    Comment: occasional   Drug use: No   Sexual activity: Not Currently  Other Topics Concern   Not on file  Social History Narrative   Not on file   Social Determinants of Health   Financial Resource Strain: Low Risk  (10/09/2022)   Overall Financial Resource Strain  (CARDIA)    Difficulty of Paying Living Expenses: Not hard at all  Food Insecurity: No Food Insecurity (10/09/2022)   Hunger Vital Sign    Worried About Running Out of Food in the Last Year: Never true    Ran Out of Food in the Last Year: Never true  Transportation Needs: No Transportation Needs (10/09/2022)   PRAPARE - Administrator, Civil Service (Medical): No    Lack of Transportation (Non-Medical): No  Physical Activity: Insufficiently Active (10/09/2022)   Exercise Vital Sign    Days of Exercise per Week: 1 day    Minutes of Exercise per Session: 30 min  Stress: No Stress Concern Present (10/09/2022)   [employer] of Occupational Health - Occupational Stress Questionnaire    Feeling of Stress : Not at all  Social Connections: Unknown (10/09/2022)   Social Connection and Isolation Panel [NHANES]    Frequency of Communication with Friends and Family: More than three times a week    Frequency of Social Gatherings with Friends and Family: More than three times a week    Attends Religious Services: Not  on file    Active Member of Clubs or Organizations: Patient declined    Attends Banker Meetings: Patient declined    Marital Status: Patient declined   Outpatient Encounter Medications as of 02/10/2023  Medication Sig   amLODipine (NORVASC) 5 MG tablet Take 5 mg by mouth daily.   brimonidine (ALPHAGAN) 0.15 % ophthalmic solution Place 1 drop into both eyes 3 (three) times daily.   Calcium Carbonate-Vit D-Min (CALCIUM 1200 PO) Take by mouth.   cyclobenzaprine (FLEXERIL) 5 MG tablet Take 1 tablet (5 mg total) by mouth 2 (two) times daily as needed for muscle spasms.   dorzolamide (TRUSOPT) 2 % ophthalmic solution Place 1 drop into the left eye 2 (two) times daily.   folic acid (FOLVITE) 1 MG tablet Take 1 mg by mouth daily.    latanoprost (XALATAN) 0.005 % ophthalmic solution Place 1 drop into the left eye at bedtime.   levothyroxine (SYNTHROID) 125 MCG tablet TAKE  1 TABLET(125 MCG) BY MOUTH DAILY BEFORE BREAKFAST   lidocaine (XYLOCAINE) 2 % solution Use as directed 10 mLs in the mouth or throat every 3 (three) hours as needed.   lisinopril (ZESTRIL) 10 MG tablet TAKE 1 TABLET(10 MG) BY MOUTH DAILY   LUMIGAN 0.01 % SOLN Place 1 drop into the left eye at bedtime.   Multiple Vitamins-Minerals (MULTIVITAMIN WITH MINERALS) tablet Take 1 tablet by mouth daily.   potassium chloride SA (KLOR-CON M) 20 MEQ tablet TAKE 1 TABLET BY MOUTH EVERY DAY   promethazine-dextromethorphan (PROMETHAZINE-DM) 6.25-15 MG/5ML syrup Take 5 mLs by mouth 4 (four) times daily as needed.   timolol (TIMOPTIC) 0.5 % ophthalmic solution 1 drop 2 (two) times daily.   torsemide (DEMADEX) 20 MG tablet TAKE 1 TO 2 TABLETS BY MOUTH DAILY AS NEEDED FOR SWELLING   No facility-administered encounter medications on file as of 02/10/2023.   ALLERGIES: Allergies  Allergen Reactions   Acetazolamide    Dicloxacillin Other (See Comments)    GI upset   VACCINATION STATUS: Immunization History  Administered Date(s) Administered   Fluad Quad(high Dose 65+) 01/13/2019, 01/11/2021, 01/15/2022   Influenza Whole 02/09/2013   Influenza, High Dose Seasonal PF 01/20/2017, 01/27/2018, 01/04/2020   Influenza,inj,Quad PF,6+ Mos 01/15/2015, 01/25/2016   Influenza-Unspecified 01/13/2014   Moderna SARS-COV2 Booster Vaccination 03/21/2020, 12/11/2020   Moderna Sars-Covid-2 Vaccination 06/16/2019, 07/18/2019   PNEUMOCOCCAL CONJUGATE-20 10/26/2021   Pneumococcal Conjugate-13 01/15/2015   Pneumococcal Polysaccharide-23 01/13/2014   Tdap 10/02/2009   Varicella 03/03/2010   Zoster Recombinant(Shingrix) 02/09/2021, 04/10/2021    Thyroid Problem Presents for follow-up visit. Patient reports no anxiety, cold intolerance, constipation, depressed mood, diarrhea, fatigue, hair loss, heat intolerance, palpitations, tremors, weight gain or weight loss. The symptoms have been stable.    She is on levothyroxine 125  mcg p.o. daily before breakfast.  She reports reasonable consistency and compliance.  She has no new complaints today.    She presents with thyroid function test consistent with appropriate replacement.   She denies palpitations, tremors, nor heat intolerance. she denies dysphagia, SOB, nor voice change.  She presents with steady weight.    Review of systems  Constitutional: + Minimally fluctuating body weight,  current Body mass index is 32.64 kg/m. , no fatigue,  no subjective hypothermia    Objective:    BP (!) 116/58   Pulse 60   Ht 5\' 6"  (1.676 m)   Wt 202 lb 3.2 oz (91.7 kg)   BMI 32.64 kg/m   Wt Readings from Last 3  Encounters:  02/10/23 202 lb 3.2 oz (91.7 kg)  11/19/22 200 lb 6.4 oz (90.9 kg)  10/13/22 200 lb 1.9 oz (90.8 kg)    BP Readings from Last 3 Encounters:  02/10/23 (!) 116/58  12/17/22 123/64  11/19/22 118/71      Physical Exam- Limited  Constitutional:  Body mass index is 32.64 kg/m. , not in acute distress, normal state of mind     CMP     Component Value Date/Time   NA 144 12/10/2020 1140   K 4.4 12/10/2020 1140   CL 104 12/10/2020 1140   CO2 25 12/10/2020 1140   GLUCOSE 111 (H) 12/10/2020 1140   GLUCOSE 109 (H) 07/05/2020 1928   BUN 32 (H) 12/10/2020 1140   CREATININE 1.33 (H) 12/10/2020 1140   CREATININE 1.28 (H) 02/10/2020 0926   CALCIUM 9.4 12/10/2020 1140   PROT 7.2 12/10/2020 1140   ALBUMIN 4.5 12/10/2020 1140   AST 27 12/10/2020 1140   ALT 17 12/10/2020 1140   ALKPHOS 118 12/10/2020 1140   BILITOT 0.9 12/10/2020 1140   GFRNONAA 23 (L) 07/05/2020 1928   GFRNONAA 41 (L) 03/28/2019 0744   GFRAA 48 (L) 03/28/2019 0744     Diabetic Labs (most recent): Lab Results  Component Value Date   HGBA1C 5.5 10/09/2022   HGBA1C 5.6 10/08/2021   HGBA1C 5.3 02/10/2020   MICROALBUR 3.6 02/10/2020   MICROALBUR 1.1 11/17/2018   MICROALBUR 0.4 08/09/2018     Lipid Panel ( most recent) Lipid Panel     Component Value Date/Time    CHOL 179 10/01/2022 0821   TRIG 101 10/01/2022 0821   HDL 68 10/01/2022 0821   CHOLHDL 2.6 10/01/2022 0821   CHOLHDL 2.6 02/10/2020 0926   VLDL 17 07/15/2016 0859   LDLCALC 93 10/01/2022 0821   LDLCALC 88 02/10/2020 0926    Recent Results (from the past 2160 hour(s))  POCT rapid strep A     Status: None   Collection Time: 12/17/22  8:30 AM  Result Value Ref Range   Rapid Strep A Screen Negative Negative  SARS CORONAVIRUS 2 (TAT 6-24 HRS) Anterior Nasal Swab     Status: Abnormal   Collection Time: 12/17/22  8:38 AM   Specimen: Anterior Nasal Swab  Result Value Ref Range   SARS Coronavirus 2 POSITIVE (A) NEGATIVE    Comment: (NOTE) SARS-CoV-2 target nucleic acids are DETECTED.  The SARS-CoV-2 RNA is generally detectable in upper and lower respiratory specimens during the acute phase of infection. Positive results are indicative of the presence of SARS-CoV-2 RNA. Clinical correlation with patient history and other diagnostic information is  necessary to determine patient infection status. Positive results do not rule out bacterial infection or co-infection with other viruses.  The expected result is Negative.  Fact Sheet for Patients: HairSlick.no  Fact Sheet for Healthcare Providers: quierodirigir.com  This test is not yet approved or cleared by the [place] FDA and  has been authorized for detection and/or diagnosis of SARS-CoV-2 by FDA under an Emergency Use Authorization (EUA). This EUA will remain  in effect (meaning this test can be used) for the duration of the COVID-19 declaration under Section 564(b)(1) of the Act, 21 U. S.C. section 360bbb-3(b)(1), unless the authorization is terminated or revoked sooner.   Performed at [facility], [address] [place], [place] [address]   T4, free     Status: None   Collection Time: 02/03/23  9:09 AM  Result Value Ref Range  Free T4 1.55 0.82 - 1.77 ng/dL   TSH     Status: None   Collection Time: 02/03/23  9:09 AM  Result Value Ref Range   TSH 0.851 0.450 - 4.500 uIU/mL       Assessment & Plan:   1. 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  She is advised to continue levothyroxine 125 mcg p.o. daily before breakfast.    - We discussed about the correct intake of her thyroid hormone, on empty stomach at fasting, with water, separated by at least 30 minutes from breakfast and other medications,  and separated by more than 4 hours from calcium, iron, multivitamins, acid reflux medications (PPIs). -Patient is made aware of the fact that thyroid hormone replacement is needed for life, dose to be adjusted by periodic monitoring of thyroid function tests.  In light of her history of class 1 Obesity, prediabetes, and hyperlipidemia, she is a good candidate for lifestyle medicine.  Whole food plant-based diet was discussed with her.    -Her prior visit A1c was 5.5%.   She has LDL dropping to 93 from 103.  She is not on statins yet.  She is trying to engage with whole food plant-based diet.  Her last thyroid u/s from 01/2017 showed normal size right lobe with no nodules. - no need for additional follow up at this time.   - I advised patient to maintain close follow up with Anabel Halon, MD for primary care needs.   I spent  25  minutes in the care of the patient today including review of labs from Thyroid Function, CMP, and other relevant labs ; imaging/biopsy records (current and previous including abstractions from other facilities); face-to-face time discussing  her lab results and symptoms, medications doses, her options of short and long term treatment based on the latest standards of care / guidelines;   and documenting the encounter.  Tameshia Vonasek Gerken  participated in the discussions, expressed understanding, and voiced agreement  with the above plans.  All questions were answered to her satisfaction. she is encouraged to contact clinic should she have any questions or concerns prior to her return visit.    Follow up plan: Return in about 6 months (around 08/11/2023) for Fasting Labs  in AM B4 8, A1c -NV.  Ronny Bacon, [facility] [address] [place], [place] [address] Phone: [phone] Fax: [phone]   02/10/2023, 1:18 PM

## 2023-02-16 DIAGNOSIS — R809 Proteinuria, unspecified: Secondary | ICD-10-CM | POA: Diagnosis not present

## 2023-02-16 DIAGNOSIS — I129 Hypertensive chronic kidney disease with stage 1 through stage 4 chronic kidney disease, or unspecified chronic kidney disease: Secondary | ICD-10-CM | POA: Diagnosis not present

## 2023-02-16 DIAGNOSIS — I119 Hypertensive heart disease without heart failure: Secondary | ICD-10-CM | POA: Diagnosis not present

## 2023-02-26 DIAGNOSIS — H4042X3 Glaucoma secondary to eye inflammation, left eye, severe stage: Secondary | ICD-10-CM | POA: Diagnosis not present

## 2023-02-26 DIAGNOSIS — H401112 Primary open-angle glaucoma, right eye, moderate stage: Secondary | ICD-10-CM | POA: Diagnosis not present

## 2023-03-17 ENCOUNTER — Other Ambulatory Visit: Payer: Self-pay | Admitting: Internal Medicine

## 2023-03-17 DIAGNOSIS — I10 Essential (primary) hypertension: Secondary | ICD-10-CM

## 2023-04-03 IMAGING — DX DG CHEST 2V
2 series · 2 of 2 positions shown · non-contrast
Comparison: July 05, 2020.

CLINICAL DATA: Cough, hypoxia.

EXAM:
CHEST - 2 VIEW

[chest pa]
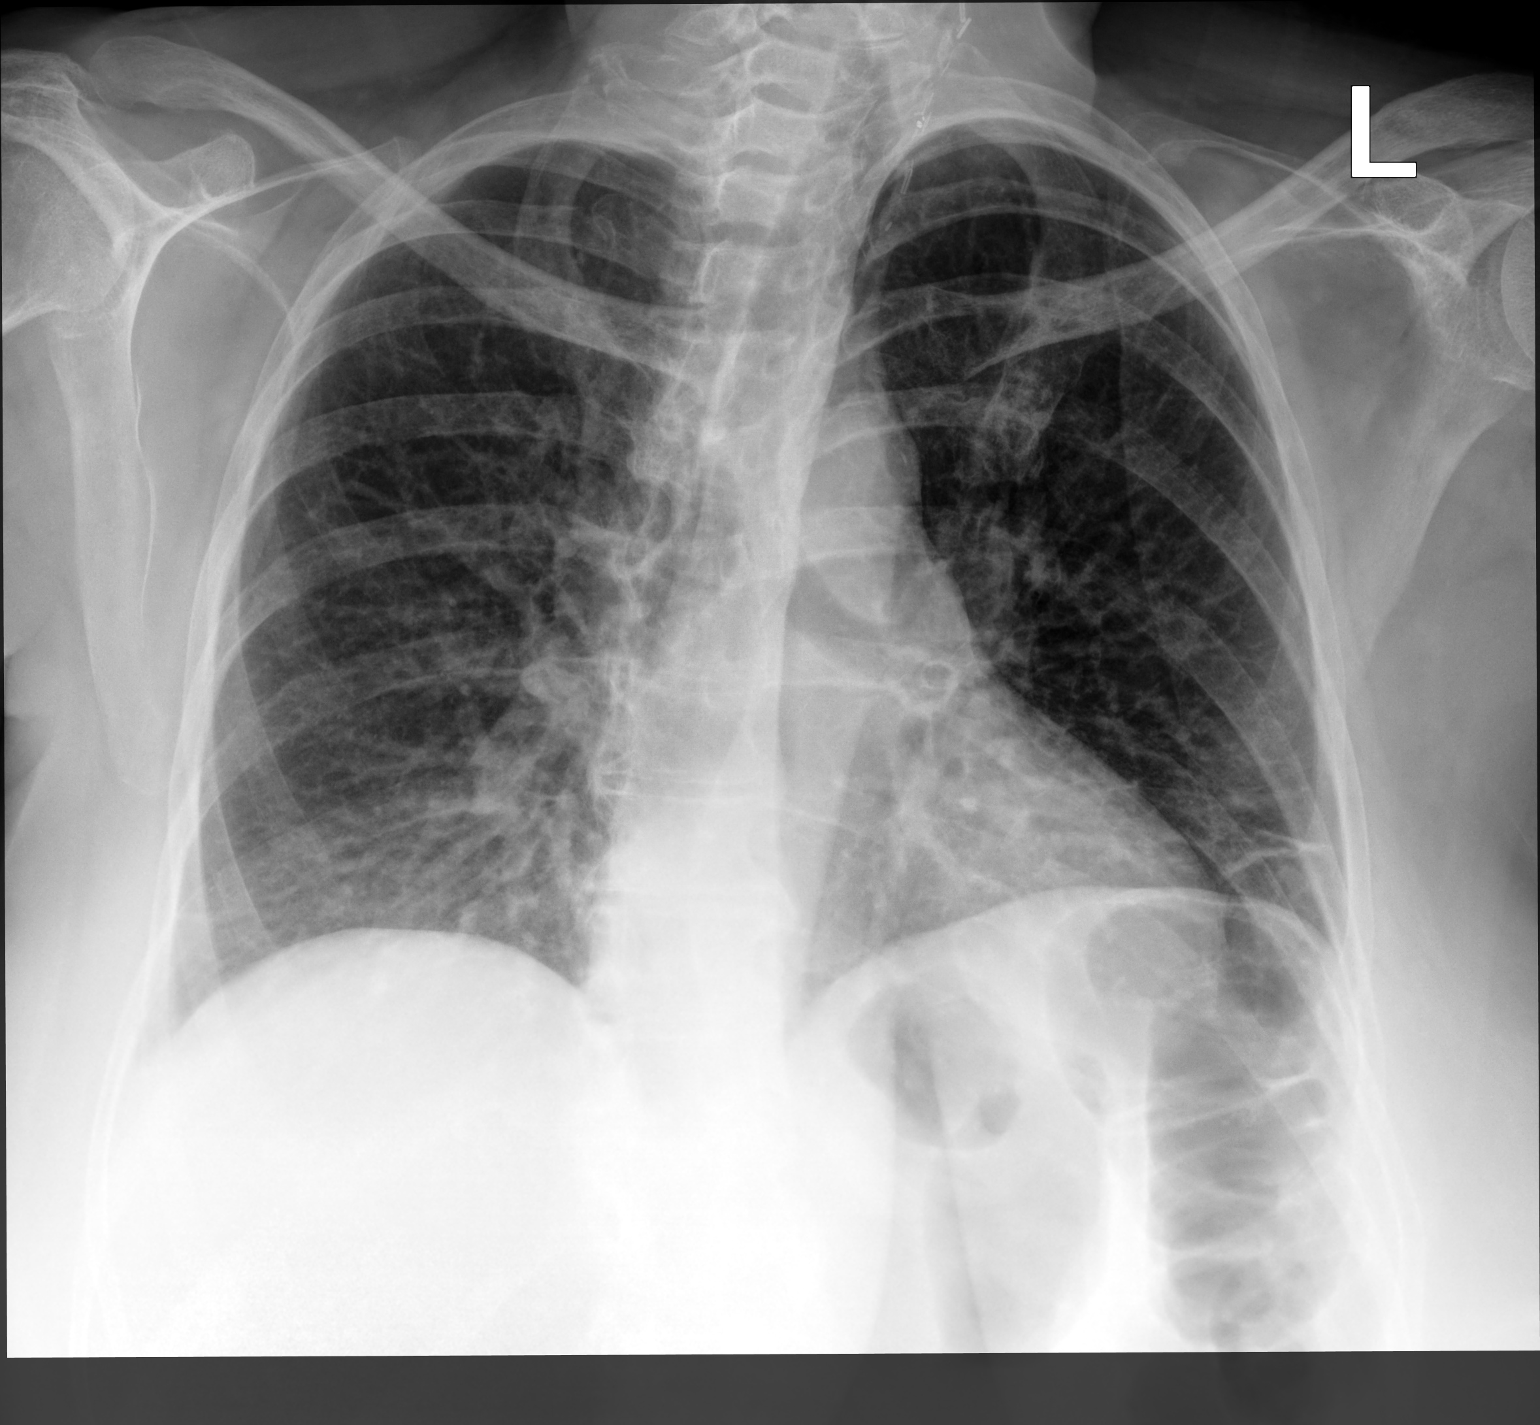

[chest lat]
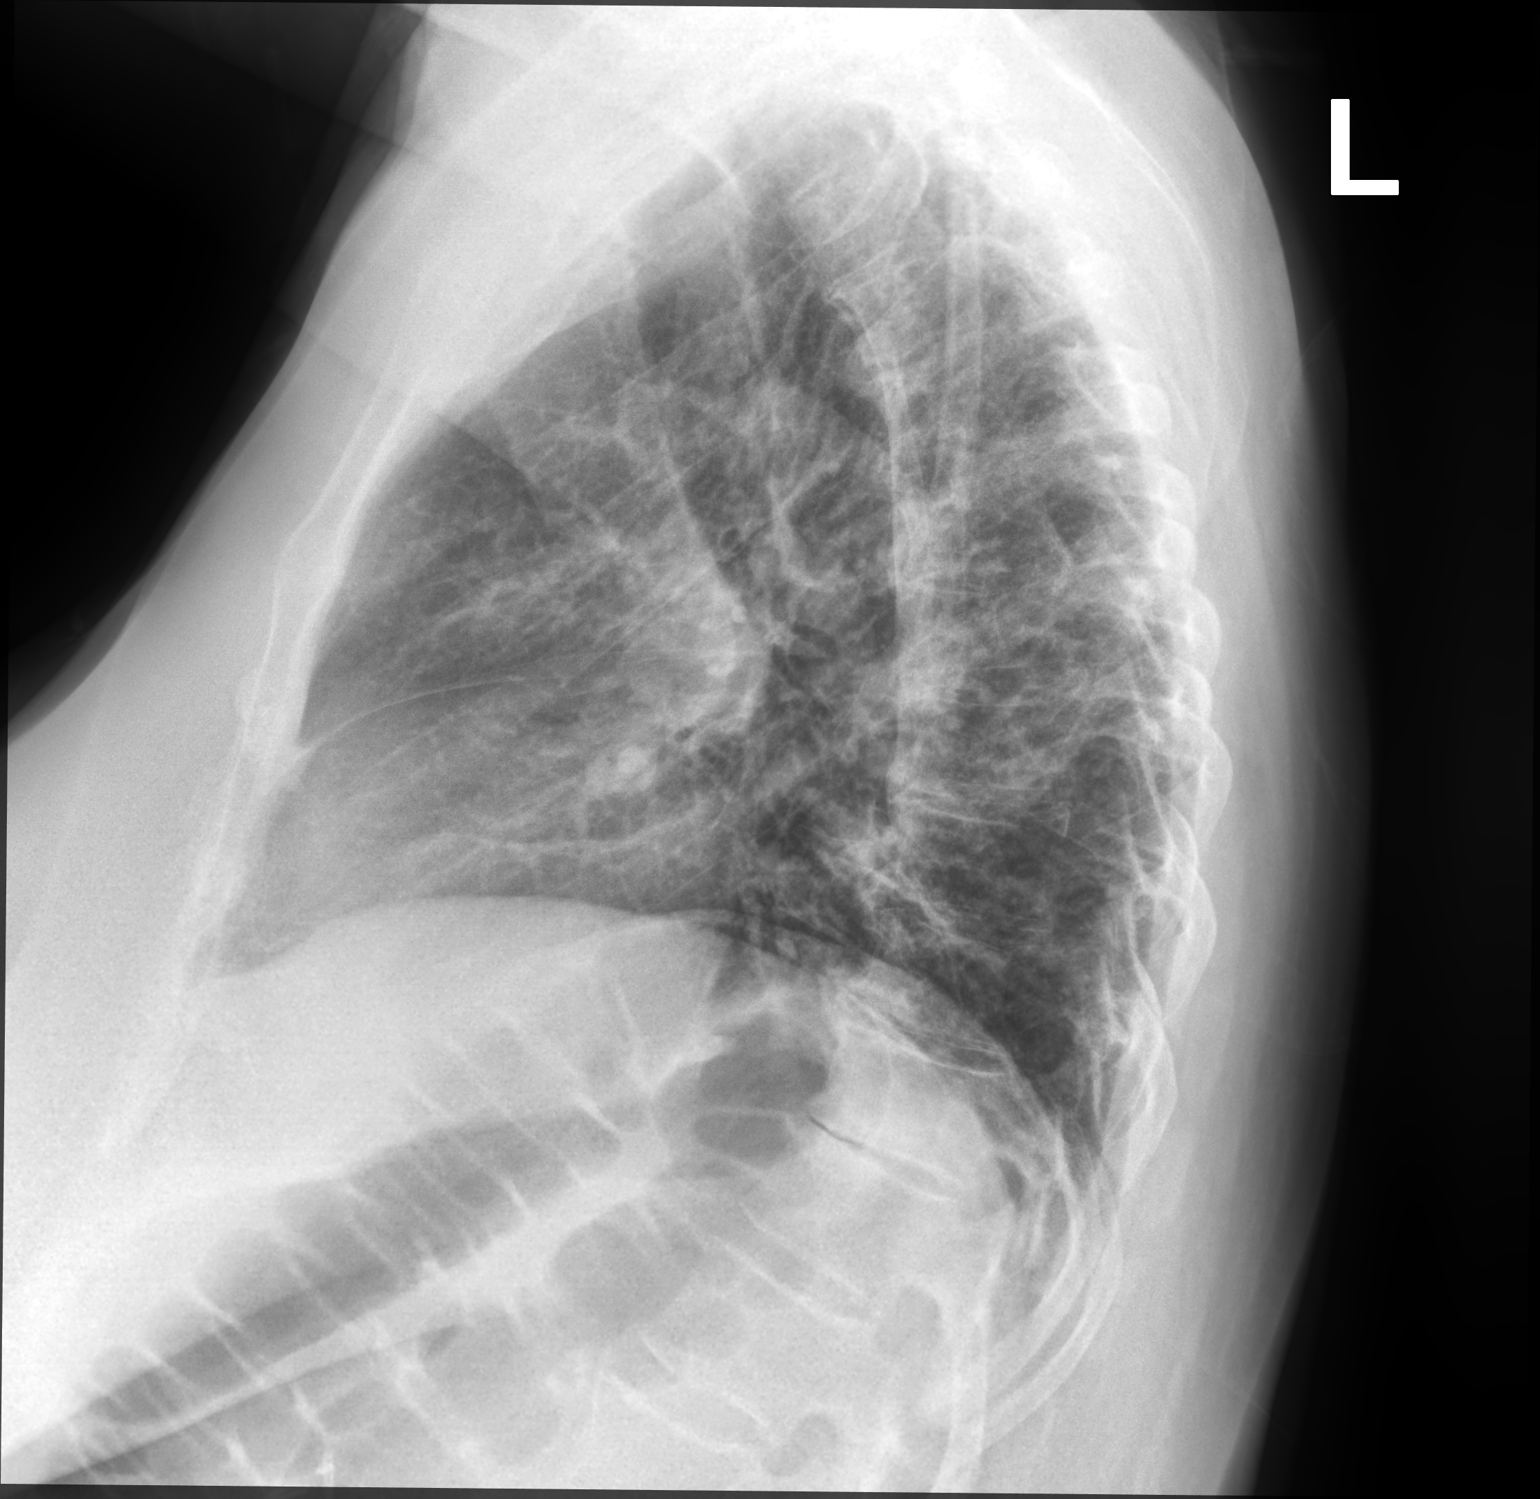

[2 of 2 positions shown; findings below may reference images not displayed]

FINDINGS: The heart size and mediastinal contours are within normal limits.
Right lung is clear. Stable minimal left basilar scarring or
atelectasis is noted. The visualized skeletal structures are
unremarkable.
IMPRESSION: Stable minimal left basilar scarring or atelectasis.

## 2023-05-08 ENCOUNTER — Encounter (INDEPENDENT_AMBULATORY_CARE_PROVIDER_SITE_OTHER): Payer: Self-pay | Admitting: *Deleted

## 2023-05-22 ENCOUNTER — Ambulatory Visit (INDEPENDENT_AMBULATORY_CARE_PROVIDER_SITE_OTHER): Payer: Medicare Other | Admitting: Internal Medicine

## 2023-05-22 ENCOUNTER — Encounter: Payer: Self-pay | Admitting: Internal Medicine

## 2023-05-22 VITALS — BP 122/66 | HR 92 | Ht 66.0 in | Wt 200.8 lb

## 2023-05-22 DIAGNOSIS — M19041 Primary osteoarthritis, right hand: Secondary | ICD-10-CM | POA: Diagnosis not present

## 2023-05-22 DIAGNOSIS — E890 Postprocedural hypothyroidism: Secondary | ICD-10-CM | POA: Diagnosis not present

## 2023-05-22 DIAGNOSIS — E782 Mixed hyperlipidemia: Secondary | ICD-10-CM | POA: Diagnosis not present

## 2023-05-22 DIAGNOSIS — M19042 Primary osteoarthritis, left hand: Secondary | ICD-10-CM | POA: Diagnosis not present

## 2023-05-22 DIAGNOSIS — I739 Peripheral vascular disease, unspecified: Secondary | ICD-10-CM

## 2023-05-22 DIAGNOSIS — N1831 Chronic kidney disease, stage 3a: Secondary | ICD-10-CM | POA: Diagnosis not present

## 2023-05-22 DIAGNOSIS — I10 Essential (primary) hypertension: Secondary | ICD-10-CM

## 2023-05-22 MED ORDER — DICLOFENAC SODIUM 1 % EX GEL
4.0000 g | Freq: Four times a day (QID) | CUTANEOUS | 1 refills | Status: DC
Start: 2023-05-22 — End: 2024-03-24

## 2023-05-22 NOTE — Assessment & Plan Note (Signed)
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

## 2023-05-22 NOTE — Assessment & Plan Note (Signed)
On Levothyroxine 125 mcg QD Follows up with Endocrinology - Dr Fransico Him

## 2023-05-22 NOTE — Assessment & Plan Note (Signed)
BP Readings from Last 1 Encounters:  05/22/23 122/66   Well-controlled with lisinopril and amlodipine now Followed by Nephrology Counseled for compliance with the medications Advised DASH diet and moderate exercise/walking as tolerated

## 2023-05-22 NOTE — Patient Instructions (Addendum)
Please use Tylenol arthritis for hand pain. Okay to apply Diclofenac gel for hand pain.  Please continue to take medications as prescribed.  Please continue to follow low salt diet and perform moderate exercise/walking at least 150 mins/week.

## 2023-05-22 NOTE — Assessment & Plan Note (Signed)
Likely contributing to leg swelling Takes Demadex PRN Leg elevation and compression socks as tolerated

## 2023-05-22 NOTE — Progress Notes (Signed)
Established Patient Office Visit  Subjective:  Patient ID: Danielle Howard, female    DOB: 05-02-1951  Age: 73 y.o. MRN: [id number]  CC:  Chief Complaint  Patient presents with   Annual Exam    Cpe today   Arthritis    Reports hand pain from arthritis.     HPI Danielle Howard is a 73 y.o. female with past medical history of HTN, PVD, hypothyroidism, gluacoma, uveitis and obesity who presents for f/u of her chronic medical conditions.  HTN: Her BP is well controlled today.  She takes lisinopril 10 mg daily and amlodipine 5 mg daily.  She denies any headache, dizziness, chest pain, dyspnea or palpitations.  CKD: Her GFR was stable at 55 in 10/24.  She has stopped taking cyclosporine, which was prescribed by her ophthalmologist for uveitis.  She denies any dysuria, hematuria or urinary hesitancy or resistance.  Hypothyroidism: She takes levothyroxine 125 mcg QD. Followed by Dr. Fransico Him.  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  She has tried taking Tylenol with mild relief.  She is able to make a fist.  Denies hand weakness.  Past Medical History:  Diagnosis Date   Arthritis    Cataract    Complication of anesthesia    nausea   Glaucoma    Headache    Hypertension    Hypothyroidism    Hypothyroidism    Iritis    Thyroid disease    Uveitis of left eye 12/29/2017    Past Surgical History:  Procedure Laterality Date   ABDOMINAL HYSTERECTOMY     CATARACT EXTRACTION Left 2019   CATARACT EXTRACTION Right 06/12/2022   CHOLECYSTECTOMY     02/2012   COLONOSCOPY N/A 05/25/2013   Procedure: COLONOSCOPY;  Surgeon: Malissa Hippo, MD;  [facility]: [facility];  Service: Endoscopy;  Laterality: N/A;  830-moved to 1055 Ann to notify pt   COLONOSCOPY N/A 06/21/2018   Procedure: COLONOSCOPY;  Surgeon: Malissa Hippo, MD;  Location: [facility];  Service: Endoscopy;  Laterality: N/A;  1020   CYST EXCISION  08/21/2022   EYE SURGERY N/A     [employer] 07/13/2020   JOINT REPLACEMENT Bilateral    2012,2013-bilateral knees   POLYPECTOMY  06/21/2018   Procedure: POLYPECTOMY;  Surgeon: Malissa Hippo, MD;  Location: [facility];  Service: Endoscopy;;   THYROID SURGERY      Family History  Problem Relation Age of Onset   Arthritis Mother    Heart disease Mother    Hypertension Mother    Arthritis Father    Hyperlipidemia Father    HIV/AIDS Brother     Social History   Socioeconomic History   Marital status: Single    Spouse name: Not on file   Number of children: Not on file   Years of education: Not on file   Highest education level: Not on file  Occupational History   Not on file  Tobacco Use   Smoking status: Never   Smokeless tobacco: Never  Vaping Use   Vaping status: Never Used  Substance and Sexual Activity   Alcohol use: Not Currently    Comment: occasional   Drug use: No   Sexual activity: Not Currently  Other Topics Concern   Not on file  Social History Narrative   Not on file   Social Drivers of Health   Financial Resource Strain: Low Risk  (10/09/2022)   Overall Financial Resource  Strain (CARDIA)    Difficulty of Paying Living Expenses: Not hard at all  Food Insecurity: No Food Insecurity (10/09/2022)   Hunger Vital Sign    Worried About Running Out of Food in the Last Year: Never true    Ran Out of Food in the Last Year: Never true  Transportation Needs: No Transportation Needs (10/09/2022)   PRAPARE - Administrator, Civil Service (Medical): No    Lack of Transportation (Non-Medical): No  Physical Activity: Insufficiently Active (10/09/2022)   Exercise Vital Sign    Days of Exercise per Week: 1 day    Minutes of Exercise per Session: 30 min  Stress: No Stress Concern Present (10/09/2022)   [employer] of Occupational Health - Occupational Stress Questionnaire    Feeling of Stress : Not at all  Social Connections: Unknown (10/09/2022)   Social Connection and Isolation  Panel [NHANES]    Frequency of Communication with Friends and Family: More than three times a week    Frequency of Social Gatherings with Friends and Family: More than three times a week    Attends Religious Services: Not on file    Active Member of Clubs or Organizations: Patient declined    Attends Banker Meetings: Patient declined    Marital Status: Patient declined  Intimate Partner Violence: Not At Risk (10/08/2021)   Humiliation, Afraid, Rape, and Kick questionnaire    Fear of Current or Ex-Partner: No    Emotionally Abused: No    Physically Abused: No    Sexually Abused: No    Outpatient Medications Prior to Visit  Medication Sig Dispense Refill   amLODipine (NORVASC) 5 MG tablet Take 5 mg by mouth daily.     brimonidine (ALPHAGAN) 0.15 % ophthalmic solution Place 1 drop into both eyes 3 (three) times daily. 5 mL 12   Calcium Carbonate-Vit D-Min (CALCIUM 1200 PO) Take by mouth.     dorzolamide (TRUSOPT) 2 % ophthalmic solution Place 1 drop into the left eye 2 (two) times daily.     folic acid (FOLVITE) 1 MG tablet Take 1 mg by mouth daily.      levothyroxine (SYNTHROID) 125 MCG tablet TAKE 1 TABLET(125 MCG) BY MOUTH DAILY BEFORE BREAKFAST 90 tablet 1   lisinopril (ZESTRIL) 10 MG tablet TAKE 1 TABLET(10 MG) BY MOUTH DAILY 90 tablet 2   Multiple Vitamins-Minerals (MULTIVITAMIN WITH MINERALS) tablet Take 1 tablet by mouth daily.     potassium chloride SA (KLOR-CON M) 20 MEQ tablet TAKE 1 TABLET BY MOUTH EVERY DAY 90 tablet 0   promethazine-dextromethorphan (PROMETHAZINE-DM) 6.25-15 MG/5ML syrup Take 5 mLs by mouth 4 (four) times daily as needed. 100 mL 0   timolol (TIMOPTIC) 0.5 % ophthalmic solution 1 drop 2 (two) times daily.     torsemide (DEMADEX) 20 MG tablet TAKE 1 TO 2 TABLETS BY MOUTH DAILY AS NEEDED FOR SWELLING 180 tablet 2   cyclobenzaprine (FLEXERIL) 5 MG tablet Take 1 tablet (5 mg total) by mouth 2 (two) times daily as needed for muscle spasms. 30 tablet 1    latanoprost (XALATAN) 0.005 % ophthalmic solution Place 1 drop into the left eye at bedtime.     lidocaine (XYLOCAINE) 2 % solution Use as directed 10 mLs in the mouth or throat every 3 (three) hours as needed. 100 mL 0   LUMIGAN 0.01 % SOLN Place 1 drop into the left eye at bedtime.     No facility-administered medications prior to visit.  Allergies  Allergen Reactions   Acetazolamide    Dicloxacillin Other (See Comments)    GI upset    ROS Review of Systems  Constitutional:  Negative for chills and fever.  HENT:  Negative for congestion, sinus pressure, sinus pain and sore throat.   Eyes:  Negative for pain and discharge.  Respiratory:  Negative for cough and shortness of breath.   Cardiovascular:  Positive for leg swelling. Negative for chest pain and palpitations.  Gastrointestinal:  Negative for abdominal pain, constipation, diarrhea, nausea and vomiting.  Endocrine: Negative for polydipsia and polyuria.  Genitourinary:  Negative for dysuria and hematuria.  Musculoskeletal:  Positive for arthralgias (Bilateral hands) and neck pain. Negative for neck stiffness.  Skin:  Negative for rash.  Neurological:  Negative for dizziness and weakness.  Psychiatric/Behavioral:  Negative for agitation and behavioral problems.       Objective:    Physical Exam Vitals reviewed.  Constitutional:      General: She is not in acute distress.    Appearance: She is not diaphoretic.  HENT:     Head: Normocephalic and atraumatic.     Nose: Nose normal.     Mouth/Throat:     Mouth: Mucous membranes are moist.  Eyes:     General: No scleral icterus.    Extraocular Movements: Extraocular movements intact.  Cardiovascular:     Rate and Rhythm: Normal rate and regular rhythm.     Heart sounds: Normal heart sounds. No murmur heard. Pulmonary:     Breath sounds: Normal breath sounds. No wheezing or rales.  Musculoskeletal:     Right hand: Normal range of motion.     Left hand: Normal  range of motion.     Cervical back: Neck supple. No tenderness.     Right lower leg: No edema.     Left lower leg: No edema.     Comments: [place] and Bouchard nodes bilaterally  Skin:    General: Skin is warm.     Findings: No rash.  Neurological:     General: No focal deficit present.     Mental Status: She is alert and oriented to person, place, and time.     Cranial Nerves: No cranial nerve deficit.     Sensory: No sensory deficit.     Motor: No weakness.  Psychiatric:        Mood and Affect: Mood normal.        Behavior: Behavior normal.     BP 122/66   Pulse 92   Ht 5\' 6"  (1.676 m)   Wt 200 lb 12.8 oz (91.1 kg)   SpO2 98%   BMI 32.41 kg/m  Wt Readings from Last 3 Encounters:  05/22/23 200 lb 12.8 oz (91.1 kg)  02/10/23 202 lb 3.2 oz (91.7 kg)  11/19/22 200 lb 6.4 oz (90.9 kg)    Lab Results  Component Value Date   TSH 0.851 02/03/2023   Lab Results  Component Value Date   WBC 4.3 12/10/2020   HGB 12.5 12/10/2020   HCT 38.3 12/10/2020   MCV 99 (H) 12/10/2020   PLT 310 12/10/2020   Lab Results  Component Value Date   NA 144 12/10/2020   K 4.4 12/10/2020   CO2 25 12/10/2020   GLUCOSE 111 (H) 12/10/2020   BUN 32 (H) 12/10/2020   CREATININE 1.33 (H) 12/10/2020   BILITOT 0.9 12/10/2020   ALKPHOS 118 12/10/2020   AST 27 12/10/2020   ALT 17 12/10/2020  PROT 7.2 12/10/2020   ALBUMIN 4.5 12/10/2020   CALCIUM 9.4 12/10/2020   ANIONGAP 12 07/05/2020   EGFR 43 (L) 12/10/2020   Lab Results  Component Value Date   CHOL 179 10/01/2022   Lab Results  Component Value Date   HDL 68 10/01/2022   Lab Results  Component Value Date   LDLCALC 93 10/01/2022   Lab Results  Component Value Date   TRIG 101 10/01/2022   Lab Results  Component Value Date   CHOLHDL 2.6 10/01/2022   Lab Results  Component Value Date   HGBA1C 5.5 10/09/2022      Assessment & Plan:   Problem List Items Addressed This Visit       Cardiovascular and Mediastinum    Essential hypertension, benign - Primary   BP Readings from Last 1 Encounters:  05/22/23 122/66   Well-controlled with lisinopril and amlodipine now Followed by Nephrology Counseled for compliance with the medications Advised DASH diet and moderate exercise/walking as tolerated      PVD (peripheral vascular disease) (HCC)   Likely contributing to leg swelling Takes Demadex PRN Leg elevation and compression socks as tolerated        Endocrine   Hypothyroidism   On Levothyroxine 125 mcg QD Follows up with Endocrinology - Dr Fransico Him        Musculoskeletal and Integument   Osteoarthritis, hand   Pain and morning stiffness likely due to OA of hands Advised to take Tylenol arthritis as needed for pain Avoid oral NSAIDs due to CKD - started diclofenac gel for local relief      Relevant Medications   diclofenac Sodium (VOLTAREN) 1 % GEL     Genitourinary   Chronic kidney disease (CKD), stage III (moderate) (HCC)   Last BMP reviewed - GFR stable at 55 She has stopped taking soft drinks cyclosporine and acyclovir discontinued Advised to maintain proper hydration Avoid nephrotoxic agents Followed by nephrology        Other   Mixed hyperlipidemia (Chronic)   Check lipid profile Advised to follow DASH diet for now         Meds ordered this encounter  Medications   diclofenac Sodium (VOLTAREN) 1 % GEL    Sig: Apply 4 g topically 4 (four) times daily.    Dispense:  100 g    Refill:  1     Follow-up: Return in about 6 months (around 11/19/2023) for HTN and CKD.    Anabel Halon, MD

## 2023-05-22 NOTE — Assessment & Plan Note (Addendum)
Last BMP reviewed - GFR stable at 55 She has stopped taking soft drinks cyclosporine and acyclovir discontinued Advised to maintain proper hydration Avoid nephrotoxic agents Followed by nephrology

## 2023-05-22 NOTE — Assessment & Plan Note (Addendum)
Check lipid profile Advised to follow DASH diet for now

## 2023-05-29 ENCOUNTER — Other Ambulatory Visit: Payer: Self-pay | Admitting: Internal Medicine

## 2023-06-04 ENCOUNTER — Telehealth (INDEPENDENT_AMBULATORY_CARE_PROVIDER_SITE_OTHER): Payer: Self-pay | Admitting: Gastroenterology

## 2023-06-04 DIAGNOSIS — Z860100 Personal history of colon polyps, unspecified: Secondary | ICD-10-CM

## 2023-06-04 NOTE — Telephone Encounter (Signed)
Who is your primary care physician: Dr.Rutwik Patel  Reasons for the colonoscopy:   Have you had a colonoscopy before?  Yes   Do you have family history of colon cancer? no  Previous colonoscopy with polyps removed? yes  Do you have a history colorectal cancer?   no  Are you diabetic? If yes, Type 1 or Type 2?    no  Do you have a prosthetic or mechanical heart valve? no  Do you have a pacemaker/defibrillator?   no  Have you had endocarditis/atrial fibrillation? no  Have you had joint replacement within the last 12 months?  no  Do you tend to be constipated or have to use laxatives? no  Do you have any history of drugs or alchohol?  no  Do you use supplemental oxygen?  no  Have you had a stroke or heart attack within the last 6 months? no  Do you take weight loss medication?  no  For female patients: have you had a hysterectomy?  yes                                     are you post menopausal?       no                                            do you still have your menstrual cycle? no      Do you take any blood-thinning medications such as: (aspirin, warfarin, Plavix, Aggrenox)    If yes we need the name, milligram, dosage and who is prescribing doctor  Current Outpatient Medications on File Prior to Visit  Medication Sig Dispense Refill   amLODipine (NORVASC) 5 MG tablet Take 5 mg by mouth daily.     brimonidine (ALPHAGAN) 0.15 % ophthalmic solution Place 1 drop into both eyes 3 (three) times daily. 5 mL 12   Calcium Carbonate-Vit D-Min (CALCIUM 1200 PO) Take by mouth.     dorzolamide (TRUSOPT) 2 % ophthalmic solution Place 1 drop into the left eye 2 (two) times daily.     folic acid (FOLVITE) 1 MG tablet Take 1 mg by mouth daily.      levothyroxine (SYNTHROID) 125 MCG tablet TAKE 1 TABLET(125 MCG) BY MOUTH DAILY BEFORE BREAKFAST 90 tablet 1   lisinopril (ZESTRIL) 10 MG tablet TAKE 1 TABLET(10 MG) BY MOUTH DAILY 90 tablet 2   Multiple Vitamins-Minerals (MULTIVITAMIN  WITH MINERALS) tablet Take 1 tablet by mouth daily.     potassium chloride SA (KLOR-CON M) 20 MEQ tablet TAKE 1 TABLET BY MOUTH EVERY DAY 90 tablet 0   timolol (TIMOPTIC) 0.5 % ophthalmic solution 1 drop 2 (two) times daily.     torsemide (DEMADEX) 20 MG tablet TAKE 1 TO 2 TABLETS BY MOUTH DAILY AS NEEDED FOR SWELLING 180 tablet 2   diclofenac Sodium (VOLTAREN) 1 % GEL Apply 4 g topically 4 (four) times daily. (Patient not taking: Reported on 06/04/2023) 100 g 1   promethazine-dextromethorphan (PROMETHAZINE-DM) 6.25-15 MG/5ML syrup Take 5 mLs by mouth 4 (four) times daily as needed. (Patient not taking: Reported on 06/04/2023) 100 mL 0   No current facility-administered medications on file prior to visit.    Allergies  Allergen Reactions   Acetazolamide    Dicloxacillin Other (See Comments)  GI upset     Pharmacy: [facility]  Primary Insurance Name: Medicare  Best number where you can be reached: [phone] or [phone]

## 2023-06-04 NOTE — Telephone Encounter (Signed)
Ok to schedule.  Room 1/2  Thanks,  Vista Lawman, MD Gastroenterology and Hepatology [facility]

## 2023-06-15 ENCOUNTER — Telehealth: Payer: Self-pay | Admitting: Internal Medicine

## 2023-06-15 MED ORDER — NA SULFATE-K SULFATE-MG SULF 17.5-3.13-1.6 GM/177ML PO SOLN: ORAL | 0 refills | Status: DC

## 2023-06-15 NOTE — Telephone Encounter (Signed)
 AVS states patient presented for CPE, however, CPT codes reflect 16109 and primary Dx I10.  Claim was not denied. Patient has not met deductible.  Last AWV in June 2024.

## 2023-06-15 NOTE — Telephone Encounter (Signed)
 Yes. Patient was instructed to also communicate with insurance regarding deductible.

## 2023-06-15 NOTE — Telephone Encounter (Signed)
 Pt came into office. Pt scheduled for 07/01/23. Instructions given to pt while at office. Prep sent to pharmacy.

## 2023-06-15 NOTE — Telephone Encounter (Signed)
 Questionnaire from recall, no referral needed

## 2023-06-15 NOTE — Telephone Encounter (Signed)
 Patient came by the office she has traditional medicare.  Had her physical 01.17.2025 she received a bill nothing was paid. Should have been billed regular office visit for medicare traditional.  Medicare denied it asking to refile with correct office visit CPT code.

## 2023-06-15 NOTE — Addendum Note (Signed)
 Addended by: Kory Rains on: 06/15/2023 10:24 AM   Modules accepted: Orders

## 2023-06-26 ENCOUNTER — Other Ambulatory Visit (HOSPITAL_COMMUNITY)
Admission: RE | Admit: 2023-06-26 | Discharge: 2023-06-26 | Disposition: A | Discharge status: Home / Self Care | Payer: Medicare Other | Source: Ambulatory Visit | Attending: Gastroenterology | Admitting: Gastroenterology

## 2023-06-26 DIAGNOSIS — Z09 Encounter for follow-up examination after completed treatment for conditions other than malignant neoplasm: Secondary | ICD-10-CM | POA: Diagnosis not present

## 2023-06-26 DIAGNOSIS — Z860100 Personal history of colon polyps, unspecified: Secondary | ICD-10-CM | POA: Diagnosis not present

## 2023-06-26 LAB — BASIC METABOLIC PANEL
Anion gap: 12 (ref 5–15)
BUN: 16 mg/dL (ref 8–23)
CO2: 27 mmol/L (ref 22–32)
Calcium: 9.3 mg/dL (ref 8.9–10.3)
Chloride: 105 mmol/L (ref 98–111)
Creatinine, Ser: 1.12 mg/dL — ABNORMAL HIGH (ref 0.44–1.00)
GFR, Estimated: 52 mL/min — ABNORMAL LOW (ref >60)
Glucose, Bld: 96 mg/dL (ref 70–99)
Potassium: 3.9 mmol/L (ref 3.5–5.1)
Sodium: 144 mmol/L (ref 135–145)

## 2023-07-01 ENCOUNTER — Encounter (HOSPITAL_COMMUNITY): Payer: Self-pay | Admitting: Gastroenterology

## 2023-07-01 ENCOUNTER — Encounter (HOSPITAL_COMMUNITY)
Admission: RE | Disposition: A | Discharge status: Home / Self Care | Payer: Self-pay | Source: Home / Self Care | Attending: Gastroenterology

## 2023-07-01 ENCOUNTER — Ambulatory Visit (HOSPITAL_COMMUNITY): Payer: Medicare Other | Admitting: Anesthesiology

## 2023-07-01 ENCOUNTER — Other Ambulatory Visit: Payer: Self-pay

## 2023-07-01 ENCOUNTER — Ambulatory Visit (HOSPITAL_COMMUNITY)
Admission: RE | Admit: 2023-07-01 | Discharge: 2023-07-01 | Disposition: A | Discharge status: Home / Self Care | Payer: Medicare Other | Attending: Gastroenterology | Admitting: Gastroenterology

## 2023-07-01 DIAGNOSIS — Z860101 Personal history of adenomatous and serrated colon polyps: Secondary | ICD-10-CM

## 2023-07-01 DIAGNOSIS — D123 Benign neoplasm of transverse colon: Secondary | ICD-10-CM

## 2023-07-01 DIAGNOSIS — Z1211 Encounter for screening for malignant neoplasm of colon: Secondary | ICD-10-CM | POA: Diagnosis not present

## 2023-07-01 DIAGNOSIS — K5140 Inflammatory polyps of colon without complications: Secondary | ICD-10-CM | POA: Insufficient documentation

## 2023-07-01 DIAGNOSIS — Z944 Liver transplant status: Secondary | ICD-10-CM | POA: Insufficient documentation

## 2023-07-01 DIAGNOSIS — K648 Other hemorrhoids: Secondary | ICD-10-CM

## 2023-07-01 DIAGNOSIS — Z79890 Hormone replacement therapy (postmenopausal): Secondary | ICD-10-CM | POA: Insufficient documentation

## 2023-07-01 DIAGNOSIS — I10 Essential (primary) hypertension: Secondary | ICD-10-CM

## 2023-07-01 DIAGNOSIS — E039 Hypothyroidism, unspecified: Secondary | ICD-10-CM | POA: Insufficient documentation

## 2023-07-01 DIAGNOSIS — K635 Polyp of colon: Secondary | ICD-10-CM | POA: Diagnosis not present

## 2023-07-01 HISTORY — PX: COLONOSCOPY WITH PROPOFOL: SHX5780

## 2023-07-01 HISTORY — PX: POLYPECTOMY: SHX5525

## 2023-07-01 LAB — HM COLONOSCOPY

## 2023-07-01 SURGERY — COLONOSCOPY WITH PROPOFOL
Anesthesia: General

## 2023-07-01 MED ORDER — LIDOCAINE HCL (PF) 2 % IJ SOLN
INTRAMUSCULAR | Status: DC | PRN
  Administered 2023-07-01: 50 mg via INTRADERMAL

## 2023-07-01 MED ORDER — PROPOFOL 500 MG/50ML IV EMUL
INTRAVENOUS | Status: DC | PRN
  Administered 2023-07-01: 150 ug/kg/min via INTRAVENOUS

## 2023-07-01 MED ORDER — PROPOFOL 10 MG/ML IV BOLUS
INTRAVENOUS | Status: DC | PRN
  Administered 2023-07-01 (×2): 50 mg via INTRAVENOUS

## 2023-07-01 MED ORDER — LACTATED RINGERS IV SOLN: INTRAVENOUS | Status: DC | PRN

## 2023-07-01 NOTE — Transfer of Care (Signed)
 Immediate Anesthesia Transfer of Care Note  Patient: Danielle Howard  Procedure(s) Performed: COLONOSCOPY WITH PROPOFOL POLYPECTOMY  Patient Location: Short Stay  Anesthesia Type:General  Level of Consciousness: awake, alert , oriented, and patient cooperative  Airway & Oxygen Therapy: Patient Spontanous Breathing  Post-op Assessment: Report given to RN, Post -op Vital signs reviewed and stable, and Patient moving all extremities X 4  Post vital signs: Reviewed and stable  Last Vitals:  Vitals Value Taken Time  BP 117/62 07/01/23 1259  Temp 36.4 C 07/01/23 1259  Pulse 98 07/01/23 1259  Resp 21 07/01/23 1259  SpO2 99 % 07/01/23 1259    Last Pain:  Vitals:   07/01/23 1259  TempSrc: Oral  PainSc: 0-No pain      Patients Stated Pain Goal: 6 (07/01/23 1109)  Complications: There were no known notable events for this encounter.

## 2023-07-01 NOTE — Anesthesia Preprocedure Evaluation (Addendum)
 Anesthesia Evaluation  Patient identified by MRN, date of birth, ID band Patient awake    Reviewed: Allergy & Precautions, H&P , NPO status , Patient's Chart, lab work & pertinent test results, reviewed documented beta blocker date and time   History of Anesthesia Complications (+) history of anesthetic complications  Airway Mallampati: II  TM Distance: >3 FB Neck ROM: full    Dental no notable dental hx. (+) Dental Advisory Given, Teeth Intact   Pulmonary neg pulmonary ROS   Pulmonary exam normal breath sounds clear to auscultation       Cardiovascular Exercise Tolerance: Good hypertension, + Peripheral Vascular Disease  Normal cardiovascular exam Rhythm:regular Rate:Normal     Neuro/Psych  Headaches glaucoma  negative psych ROS   GI/Hepatic negative GI ROS, Neg liver ROS,,,Liver transplant   Endo/Other  Hypothyroidism    Renal/GU Renal disease  negative genitourinary   Musculoskeletal  (+) Arthritis , Osteoarthritis,    Abdominal   Peds  Hematology negative hematology ROS (+)   Anesthesia Other Findings   Reproductive/Obstetrics negative OB ROS                             Anesthesia Physical Anesthesia Plan  ASA: 3  Anesthesia Plan: General   Post-op Pain Management: Minimal or no pain anticipated   Induction:   PONV Risk Score and Plan: Propofol infusion  Airway Management Planned: Natural Airway and Nasal Cannula  Additional Equipment: None  Intra-op Plan:   Post-operative Plan:   Informed Consent: I have reviewed the patients History and Physical, chart, labs and discussed the procedure including the risks, benefits and alternatives for the proposed anesthesia with the patient or authorized representative who has indicated his/her understanding and acceptance.     Dental Advisory Given  Plan Discussed with: CRNA  Anesthesia Plan Comments:         Anesthesia Quick Evaluation

## 2023-07-01 NOTE — Op Note (Signed)
 [facility] Patient Name: Danielle Howard Procedure Date: 07/01/2023 8:14 AM MRN: [id number] Date of Birth: Jun 26, 1950 Attending MD: Sanjuan Dame , MD, [id number] CSN: [id number] Age: 73 Admit Type: Outpatient Procedure:                Colonoscopy Indications:              High risk colon cancer surveillance: Personal                            history of colonic polyps, High risk colon cancer                            surveillance: Personal history of adenoma (10 mm or                            greater in size) Providers:                Sanjuan Dame, MD, Edrick Kins, RN, Elinor Parkinson Referring MD:             Sanjuan Dame, MD Medicines:                Monitored Anesthesia Care Complications:            No immediate complications. Estimated Blood Loss:     Estimated blood loss: none. Procedure:                Pre-Anesthesia Assessment:                           - Prior to the procedure, a History and Physical                            was performed, and patient medications and                            allergies were reviewed. The patient's tolerance of                            previous anesthesia was also reviewed. The risks                            and benefits of the procedure and the sedation                            options and risks were discussed with the patient.                            All questions were answered, and informed consent                            was obtained. Prior Anticoagulants: The patient has  taken no anticoagulant or antiplatelet agents. ASA                            Grade Assessment: II - A patient with mild systemic                            disease. After reviewing the risks and benefits,                            the patient was deemed in satisfactory condition to                            undergo the procedure.                           After obtaining informed  consent, the colonoscope                            was passed under direct vision. Throughout the                            procedure, the patient's blood pressure, pulse, and                            oxygen saturations were monitored continuously. The                            [id number]-[id number] ([id number]) scope was introduced through                            the anus and advanced to the the cecum, identified                            by appendiceal orifice and ileocecal valve. The                            colonoscopy was performed without difficulty. The                            patient tolerated the procedure well. The quality                            of the bowel preparation was evaluated using the                            BBPS [facility] Bowel Preparation Scale) with scores                            of: Right Colon = 3, Transverse Colon = 3 and Left                            Colon = 3 (entire mucosa seen well with no residual  staining, small fragments of stool or opaque                            liquid). The total BBPS score equals 9. The                            ileocecal valve, appendiceal orifice, and rectum                            were photographed. Scope In: 12:34:34 PM Scope Out: 12:52:26 PM Scope Withdrawal Time: 0 hours 15 minutes 59 seconds  Total Procedure Duration: 0 hours 17 minutes 52 seconds  Findings:      The perianal and digital rectal examinations were normal.      A 4 mm polyp was found in the transverse colon. The polyp was sessile.       The polyp was removed with a cold snare. Resection and retrieval were       complete.      Non-bleeding external and internal hemorrhoids were found during       retroflexion. The hemorrhoids were small. Impression:               - One 4 mm polyp in the transverse colon, removed                            with a cold snare. Resected and retrieved.                           - Non-bleeding  external and internal hemorrhoids. Moderate Sedation:      Per Anesthesia Care Recommendation:           - Patient has a contact number available for                            emergencies. The signs and symptoms of potential                            delayed complications were discussed with the                            patient. Return to normal activities tomorrow.                            Written discharge instructions were provided to the                            patient.                           - Resume previous diet.                           - Continue present medications.                           - Await pathology results.                           -  Repeat colonoscopy in 5 years for surveillance                            given history of advance adenoma                           - Return to primary care physician as previously                            scheduled. Procedure Code(s):        --- Professional ---                           [phone], Colonoscopy, flexible; with removal of                            tumor(s), polyp(s), or other lesion(s) by snare                            technique Diagnosis Code(s):        --- Professional ---                           D12.3, Benign neoplasm of transverse colon (hepatic                            flexure or splenic flexure)                           K64.8, Other hemorrhoids                           Z86.010, Personal history of colonic polyps CPT copyright 2022 American Medical Association. All rights reserved. The codes documented in this report are preliminary and upon coder review may  be revised to meet current compliance requirements. Sanjuan Dame, MD Sanjuan Dame, MD 07/01/2023 1:01:29 PM This report has been signed electronically. Number of Addenda: 0

## 2023-07-01 NOTE — Discharge Instructions (Signed)

## 2023-07-01 NOTE — H&P (Signed)
 Primary Care Physician:  Anabel Halon, MD Primary Gastroenterologist:  Dr. Tasia Catchings  Pre-Procedure History & Physical: HPI:  [name] BELOW is a 73 y.o. female is here for a colonoscopy for surveillance colonoscopy given history of advance adenomas  .  Patient denies any family history of colorectal cancer.  No melena or hematochezia.  No abdominal pain or unintentional weight loss.  No change in bowel habits.  Overall feels well from a GI standpoint.  2020 with Dr Karilyn Cota    - Perianal skin tags found on perianal exam. - One 12 mm polyp at the hepatic flexure, removed with a hot snare. Resected and retrieved. - Three small polyps in the transverse colon, removed with a cold snare. Resected and retrieved. - External hemorrhoids. - Anal papilla( e) were hypertrophied.  All TA  Past Medical History:  Diagnosis Date   Arthritis    Cataract    Complication of anesthesia    nausea   Glaucoma    Headache    Hypertension    Hypothyroidism    Hypothyroidism    Iritis    Thyroid disease    Uveitis of left eye 12/29/2017    Past Surgical History:  Procedure Laterality Date   ABDOMINAL HYSTERECTOMY     CATARACT EXTRACTION Left 2019   CATARACT EXTRACTION Right 06/12/2022   CHOLECYSTECTOMY     02/2012   COLONOSCOPY N/A 05/25/2013   Procedure: COLONOSCOPY;  Surgeon: Malissa Hippo, MD;  Location: [facility];  Service: Endoscopy;  Laterality: N/A;  830-moved to 1055 Ann to notify pt   COLONOSCOPY N/A 06/21/2018   Procedure: COLONOSCOPY;  Surgeon: Malissa Hippo, MD;  Location: [facility];  Service: Endoscopy;  Laterality: N/A;  1020   CYST EXCISION  08/21/2022   EYE SURGERY N/A    [employer] 07/13/2020   JOINT REPLACEMENT Bilateral    2012,2013-bilateral knees   POLYPECTOMY  06/21/2018   Procedure: POLYPECTOMY;  Surgeon: Malissa Hippo, MD;  Location: [facility];  Service: Endoscopy;;   THYROID SURGERY      Prior to Admission medications   Medication Sig Start Date  End Date Taking? Authorizing Provider  amLODipine (NORVASC) 5 MG tablet Take 5 mg by mouth daily.   Yes [provider]  brimonidine (ALPHAGAN) 0.15 % ophthalmic solution Place 1 drop into both eyes 3 (three) times daily. 02/10/20  Yes , Velna Hatchet, MD  dorzolamide (TRUSOPT) 2 % ophthalmic solution Place 1 drop into the left eye 2 (two) times daily.   Yes [provider]  folic acid (FOLVITE) 1 MG tablet Take 1 mg by mouth daily.  01/25/18  Yes [provider]  levothyroxine (SYNTHROID) 125 MCG tablet TAKE 1 TABLET(125 MCG) BY MOUTH DAILY BEFORE BREAKFAST 01/06/23  Yes Nida, Denman George, MD  lisinopril (ZESTRIL) 10 MG tablet TAKE 1 TABLET(10 MG) BY MOUTH DAILY 03/17/23  Yes Anabel Halon, MD  Multiple Vitamins-Minerals (MULTIVITAMIN WITH MINERALS) tablet Take 1 tablet by mouth daily.   Yes [provider]  potassium chloride SA (KLOR-CON M) 20 MEQ tablet TAKE 1 TABLET BY MOUTH EVERY DAY 06/01/23  Yes Anabel Halon, MD  timolol (TIMOPTIC) 0.5 % ophthalmic solution 1 drop 2 (two) times daily.   Yes [provider]  torsemide (DEMADEX) 20 MG tablet TAKE 1 TO 2 TABLETS BY MOUTH DAILY AS NEEDED FOR SWELLING 11/14/21  Yes Anabel Halon, MD  Calcium Carbonate-Vit D-Min (CALCIUM 1200 PO) Take by mouth.    [provider]  diclofenac Sodium (VOLTAREN) 1 % GEL Apply 4 g topically 4 (four) times daily. Patient not taking: Reported on 06/04/2023 05/22/23   Anabel Halon, MD  Na Sulfate-K Sulfate-Mg Sulfate concentrate 17.5-3.13-1.6 GM/177ML SOLN Use as directed 06/15/23   Franky Macho, MD  promethazine-dextromethorphan (PROMETHAZINE-DM) 6.25-15 MG/5ML syrup Take 5 mLs by mouth 4 (four) times daily as needed. Patient not taking: Reported on 06/04/2023 12/17/22   Particia Nearing, PA-C    Allergies as of 06/15/2023 - Review Complete 06/04/2023  Allergen Reaction Noted   Acetazolamide  08/08/2019   Dicloxacillin Other (See Comments)  01/03/2013    Family History  Problem Relation Age of Onset   Arthritis Mother    Heart disease Mother    Hypertension Mother    Arthritis Father    Hyperlipidemia Father    HIV/AIDS Brother     Social History   Socioeconomic History   Marital status: Single    Spouse name: Not on file   Number of children: Not on file   Years of education: Not on file   Highest education level: Not on file  Occupational History   Not on file  Tobacco Use   Smoking status: Never   Smokeless tobacco: Never  Vaping Use   Vaping status: Never Used  Substance and Sexual Activity   Alcohol use: Not Currently    Comment: occasional   Drug use: No   Sexual activity: Not Currently  Other Topics Concern   Not on file  Social History Narrative   Not on file   Social Drivers of Health   Financial Resource Strain: Low Risk  (10/09/2022)   Overall Financial Resource Strain (CARDIA)    Difficulty of Paying Living Expenses: Not hard at all  Food Insecurity: No Food Insecurity (10/09/2022)   Hunger Vital Sign    Worried About Running Out of Food in the Last Year: Never true    Ran Out of Food in the Last Year: Never true  Transportation Needs: No Transportation Needs (10/09/2022)   PRAPARE - Administrator, Civil Service (Medical): No    Lack of Transportation (Non-Medical): No  Physical Activity: Insufficiently Active (10/09/2022)   Exercise Vital Sign    Days of Exercise per Week: 1 day    Minutes of Exercise per Session: 30 min  Stress: No Stress Concern Present (10/09/2022)   [employer] of Occupational Health - Occupational Stress Questionnaire    Feeling of Stress : Not at all  Social Connections: Unknown (10/09/2022)   Social Connection and Isolation Panel [NHANES]    Frequency of Communication with Friends and Family: More than three times a week    Frequency of Social Gatherings with Friends and Family: More than three times a week    Attends Religious Services: Not on file     Active Member of Clubs or Organizations: Patient declined    Attends Banker Meetings: Patient declined    Marital Status: Patient declined  Intimate Partner Violence: Not At Risk (10/08/2021)   Humiliation, Afraid, Rape, and Kick questionnaire    Fear of Current or Ex-Partner: No    Emotionally Abused: No    Physically Abused: No    Sexually Abused: No    Review of Systems: See HPI, otherwise negative ROS  Physical Exam: Vital signs in last 24 hours: Temp:  [98.2 F (36.8 C)] 98.2 F (36.8 C) (02/26 1109) Pulse Rate:  [94] 94 (02/26 1109) Resp:  [15]  15 (02/26 1109) BP: (132)/(70) 132/70 (02/26 1109) SpO2:  [100 %] 100 % (02/26 1109) Weight:  [90.3 kg] 90.3 kg (02/26 1109)   General:   Alert,  Well-developed, well-nourished, pleasant and cooperative in NAD Head:  Normocephalic and atraumatic. Eyes:  Sclera clear, no icterus.   Conjunctiva pink. Ears:  Normal auditory acuity. Nose:  No deformity, discharge,  or lesions. Msk:  Symmetrical without gross deformities. Normal posture. Extremities:  Without clubbing or edema. Neurologic:  Alert and  oriented x4;  grossly normal neurologically. Skin:  Intact without significant lesions or rashes. Psych:  Alert and cooperative. Normal mood and affect.  Impression/Plan: Danielle Howard is a 73 y.o. female is here for a colonoscopy for surveillance colonoscopy given history of advance adenomas  The risks of the procedure including infection, bleed, or perforation as well as benefits, limitations, alternatives and imponderables have been reviewed with the patient. Questions have been answered. All parties agreeable.

## 2023-07-01 NOTE — Anesthesia Postprocedure Evaluation (Deleted)
 Anesthesia Post Note  Patient: Danielle Howard  Procedure(s) Performed: COLONOSCOPY WITH PROPOFOL POLYPECTOMY  Patient location during evaluation: PACU Anesthesia Type: General Level of consciousness: awake and alert Pain management: pain level controlled Vital Signs Assessment: post-procedure vital signs reviewed and stable Respiratory status: spontaneous breathing, nonlabored ventilation, respiratory function stable and patient connected to nasal cannula oxygen Cardiovascular status: blood pressure returned to baseline and stable Postop Assessment: no apparent nausea or vomiting Anesthetic complications: no   There were no known notable events for this encounter.   Last Vitals:  Vitals:   07/01/23 1109 07/01/23 1259  BP: 132/70 117/62  Pulse: 94 98  Resp: 15 (!) 21  Temp: 36.8 C 36.4 C  SpO2: 100% 99%    Last Pain:  Vitals:   07/01/23 1259  TempSrc: Oral  PainSc: 0-No pain                 Ainhoa Rallo L Zadiel Leyh

## 2023-07-01 NOTE — Anesthesia Postprocedure Evaluation (Signed)
 Anesthesia Post Note  Patient: Danielle Howard  Procedure(s) Performed: COLONOSCOPY WITH PROPOFOL POLYPECTOMY  Patient location during evaluation: PACU Anesthesia Type: General Level of consciousness: awake and alert Pain management: pain level controlled Vital Signs Assessment: post-procedure vital signs reviewed and stable Respiratory status: spontaneous breathing, nonlabored ventilation, respiratory function stable and patient connected to nasal cannula oxygen Cardiovascular status: blood pressure returned to baseline and stable Postop Assessment: no apparent nausea or vomiting Anesthetic complications: no   There were no known notable events for this encounter.   Last Vitals:  Vitals:   07/01/23 1109 07/01/23 1259  BP: 132/70 117/62  Pulse: 94 98  Resp: 15 (!) 21  Temp: 36.8 C 36.4 C  SpO2: 100% 99%    Last Pain:  Vitals:   07/01/23 1259  TempSrc: Oral  PainSc: 0-No pain                 Ainhoa Rallo L Zadiel Leyh

## 2023-07-02 ENCOUNTER — Encounter (INDEPENDENT_AMBULATORY_CARE_PROVIDER_SITE_OTHER): Payer: Self-pay | Admitting: *Deleted

## 2023-07-02 ENCOUNTER — Encounter (HOSPITAL_COMMUNITY): Payer: Self-pay | Admitting: Gastroenterology

## 2023-07-02 LAB — SURGICAL PATHOLOGY

## 2023-07-08 ENCOUNTER — Encounter (INDEPENDENT_AMBULATORY_CARE_PROVIDER_SITE_OTHER): Payer: Self-pay | Admitting: *Deleted

## 2023-07-11 ENCOUNTER — Other Ambulatory Visit: Payer: Self-pay | Admitting: "Endocrinology

## 2023-07-27 ENCOUNTER — Other Ambulatory Visit: Payer: Self-pay | Admitting: Internal Medicine

## 2023-07-30 DIAGNOSIS — H4042X3 Glaucoma secondary to eye inflammation, left eye, severe stage: Secondary | ICD-10-CM | POA: Diagnosis not present

## 2023-07-30 DIAGNOSIS — H401112 Primary open-angle glaucoma, right eye, moderate stage: Secondary | ICD-10-CM | POA: Diagnosis not present

## 2023-07-30 DIAGNOSIS — H35033 Hypertensive retinopathy, bilateral: Secondary | ICD-10-CM | POA: Diagnosis not present

## 2023-08-04 DIAGNOSIS — E890 Postprocedural hypothyroidism: Secondary | ICD-10-CM | POA: Diagnosis not present

## 2023-08-04 DIAGNOSIS — E782 Mixed hyperlipidemia: Secondary | ICD-10-CM | POA: Diagnosis not present

## 2023-08-05 LAB — LIPID PANEL
Chol/HDL Ratio: 2.7 ratio (ref 0.0–4.4)
Cholesterol, Total: 184 mg/dL (ref 100–199)
HDL: 67 mg/dL (ref >39)
LDL Chol Calc (NIH): 98 mg/dL (ref 0–99)
Triglycerides: 109 mg/dL (ref 0–149)
VLDL Cholesterol Cal: 19 mg/dL (ref 5–40)

## 2023-08-05 LAB — T4, FREE: Free T4: 1.44 ng/dL (ref 0.82–1.77)

## 2023-08-05 LAB — TSH: TSH: 0.972 u[IU]/mL (ref 0.450–4.500)

## 2023-08-10 DIAGNOSIS — I119 Hypertensive heart disease without heart failure: Secondary | ICD-10-CM | POA: Diagnosis not present

## 2023-08-10 DIAGNOSIS — N189 Chronic kidney disease, unspecified: Secondary | ICD-10-CM | POA: Diagnosis not present

## 2023-08-10 DIAGNOSIS — E211 Secondary hyperparathyroidism, not elsewhere classified: Secondary | ICD-10-CM | POA: Diagnosis not present

## 2023-08-10 DIAGNOSIS — D631 Anemia in chronic kidney disease: Secondary | ICD-10-CM | POA: Diagnosis not present

## 2023-08-10 DIAGNOSIS — E559 Vitamin D deficiency, unspecified: Secondary | ICD-10-CM | POA: Diagnosis not present

## 2023-08-10 DIAGNOSIS — I129 Hypertensive chronic kidney disease with stage 1 through stage 4 chronic kidney disease, or unspecified chronic kidney disease: Secondary | ICD-10-CM | POA: Diagnosis not present

## 2023-08-10 DIAGNOSIS — N1831 Chronic kidney disease, stage 3a: Secondary | ICD-10-CM | POA: Diagnosis not present

## 2023-08-10 DIAGNOSIS — R809 Proteinuria, unspecified: Secondary | ICD-10-CM | POA: Diagnosis not present

## 2023-08-10 LAB — BASIC METABOLIC PANEL WITH GFR
Creatinine: 1.1 (ref 0.5–1.1)
Sodium: 145 (ref 137–147)

## 2023-08-10 LAB — CBC AND DIFFERENTIAL
HCT: 40 (ref 36–46)
Hemoglobin: 12.5 (ref 12.0–16.0)

## 2023-08-10 LAB — PROTEIN / CREATININE RATIO, URINE: Creatinine, Urine: 129

## 2023-08-10 LAB — VITAMIN D 25 HYDROXY (VIT D DEFICIENCY, FRACTURES)
PTH, Intact: 92
Vit D, 25-Hydroxy: 51.7

## 2023-08-10 LAB — COMPREHENSIVE METABOLIC PANEL WITH GFR: eGFR: 52

## 2023-08-12 ENCOUNTER — Ambulatory Visit: Payer: Medicare Other | Admitting: "Endocrinology

## 2023-08-17 DIAGNOSIS — I129 Hypertensive chronic kidney disease with stage 1 through stage 4 chronic kidney disease, or unspecified chronic kidney disease: Secondary | ICD-10-CM | POA: Diagnosis not present

## 2023-08-17 DIAGNOSIS — R809 Proteinuria, unspecified: Secondary | ICD-10-CM | POA: Diagnosis not present

## 2023-08-17 DIAGNOSIS — N1831 Chronic kidney disease, stage 3a: Secondary | ICD-10-CM | POA: Diagnosis not present

## 2023-08-24 DIAGNOSIS — H35033 Hypertensive retinopathy, bilateral: Secondary | ICD-10-CM | POA: Diagnosis not present

## 2023-08-24 DIAGNOSIS — H4042X3 Glaucoma secondary to eye inflammation, left eye, severe stage: Secondary | ICD-10-CM | POA: Diagnosis not present

## 2023-08-24 DIAGNOSIS — H401112 Primary open-angle glaucoma, right eye, moderate stage: Secondary | ICD-10-CM | POA: Diagnosis not present

## 2023-08-25 ENCOUNTER — Other Ambulatory Visit: Payer: Self-pay | Admitting: Internal Medicine

## 2023-09-08 ENCOUNTER — Encounter: Payer: Self-pay | Admitting: "Endocrinology

## 2023-09-08 ENCOUNTER — Ambulatory Visit (INDEPENDENT_AMBULATORY_CARE_PROVIDER_SITE_OTHER): Admitting: "Endocrinology

## 2023-09-08 VITALS — BP 130/72 | HR 104 | Ht 66.0 in | Wt 199.8 lb

## 2023-09-08 DIAGNOSIS — E890 Postprocedural hypothyroidism: Secondary | ICD-10-CM

## 2023-09-08 DIAGNOSIS — E782 Mixed hyperlipidemia: Secondary | ICD-10-CM

## 2023-09-08 DIAGNOSIS — R7303 Prediabetes: Secondary | ICD-10-CM

## 2023-09-08 LAB — POCT GLYCOSYLATED HEMOGLOBIN (HGB A1C): HbA1c, POC (prediabetic range): 5.8 % (ref 5.7–6.4)

## 2023-09-08 NOTE — Progress Notes (Signed)
 09/08/2023                       Endocrinology Follow Up Note    Subjective:    Patient ID: Danielle Howard, female    DOB: 1950-12-26, PCP Meldon Sport, MD   Past Medical History:  Diagnosis Date   Arthritis    Cataract    Complication of anesthesia    nausea   Glaucoma    Headache    Hypertension    Hypothyroidism    Hypothyroidism    Iritis    Thyroid  disease    Uveitis of left eye 12/29/2017   Past Surgical History:  Procedure Laterality Date   ABDOMINAL HYSTERECTOMY     CATARACT EXTRACTION Left 2019   CATARACT EXTRACTION Right 06/12/2022   CHOLECYSTECTOMY     02/2012   COLONOSCOPY N/A 05/25/2013   Procedure: COLONOSCOPY;  Surgeon: Ruby Corporal, MD;  [facility];  Service: Endoscopy;  Laterality: N/A;  830-moved to 1055 Ann to notify pt   COLONOSCOPY N/A 06/21/2018   Procedure: COLONOSCOPY;  Surgeon: Ruby Corporal, MD;  [facility];  Service: Endoscopy;  Laterality: N/A;  1020   COLONOSCOPY WITH PROPOFOL  N/A 07/01/2023   Procedure: COLONOSCOPY WITH PROPOFOL ;  Surgeon: Hargis Lias, MD;  [facility];  Service: Endoscopy;  Laterality: N/A;  12:30PM;ASA 1-2   CYST EXCISION  08/21/2022   EYE SURGERY N/A    Phreesia 07/13/2020   JOINT REPLACEMENT Bilateral    2012,2013-bilateral knees   POLYPECTOMY  06/21/2018   Procedure: POLYPECTOMY;  Surgeon: Ruby Corporal, MD;  [facility];  Service: Endoscopy;;   POLYPECTOMY  07/01/2023   Procedure: POLYPECTOMY;  Surgeon: Hargis Lias, MD;  [facility];  Service: Endoscopy;;   THYROID  SURGERY     Social History   Socioeconomic History   Marital status: Single    Spouse name: Not on file   Number of children: Not on file   Years of education: Not on file   Highest education level: Not on file  Occupational History   Not on file  Tobacco Use   Smoking status: Never   Smokeless tobacco: Never  Vaping Use   Vaping status: Never Used   Substance and Sexual Activity   Alcohol use: Not Currently    Comment: occasional   Drug use: No   Sexual activity: Not Currently  Other Topics Concern   Not on file  Social History Narrative   Not on file   Social Drivers of Health   Financial Resource Strain: Low Risk  (10/09/2022)   Overall Financial Resource Strain (CARDIA)    Difficulty of Paying Living Expenses: Not hard at all  Food Insecurity: No Food Insecurity (10/09/2022)   Hunger Vital Sign    Worried About Running Out of Food in the Last Year: Never true    Ran Out of Food in the Last Year: Never true  Transportation Needs: No Transportation Needs (10/09/2022)   PRAPARE - Administrator, Civil Service (Medical): No    Lack of Transportation (Non-Medical): No  Physical Activity: Insufficiently Active (10/09/2022)   Exercise Vital Sign    Days of Exercise per Week: 1 day    Minutes of Exercise per Session: 30 min  Stress: No Stress Concern Present (10/09/2022)   [employer] of Occupational Health - Occupational Stress Questionnaire    Feeling of Stress :  Not at all  Social Connections: Unknown (10/09/2022)   Social Connection and Isolation Panel [NHANES]    Frequency of Communication with Friends and Family: More than three times a week    Frequency of Social Gatherings with Friends and Family: More than three times a week    Attends Religious Services: Not on file    Active Member of Clubs or Organizations: Patient declined    Attends Banker Meetings: Patient declined    Marital Status: Patient declined   Outpatient Encounter Medications as of 09/08/2023  Medication Sig   amLODipine (NORVASC) 5 MG tablet Take 5 mg by mouth daily.   brimonidine (ALPHAGAN) 0.15 % ophthalmic solution Place 1 drop into both eyes 3 (three) times daily.   Calcium Carbonate-Vit D-Min (CALCIUM 1200 PO) Take by mouth.   diclofenac  Sodium (VOLTAREN ) 1 % GEL Apply 4 g topically 4 (four) times daily. (Patient not taking:  Reported on 06/04/2023)   dorzolamide (TRUSOPT) 2 % ophthalmic solution Place 1 drop into the left eye 2 (two) times daily.   folic acid  (FOLVITE ) 1 MG tablet Take 1 mg by mouth daily.    levothyroxine  (SYNTHROID ) 125 MCG tablet TAKE 1 TABLET(125 MCG) BY MOUTH DAILY BEFORE BREAKFAST   lisinopril  (ZESTRIL ) 10 MG tablet TAKE 1 TABLET(10 MG) BY MOUTH DAILY   Multiple Vitamins-Minerals (MULTIVITAMIN WITH MINERALS) tablet Take 1 tablet by mouth daily.   Na Sulfate-K Sulfate-Mg Sulfate concentrate 17.5-3.13-1.6 GM/177ML SOLN Use as directed   potassium chloride  SA (KLOR-CON  M) 20 MEQ tablet TAKE 1 TABLET BY MOUTH EVERY DAY   promethazine -dextromethorphan (PROMETHAZINE -DM) 6.25-15 MG/5ML syrup Take 5 mLs by mouth 4 (four) times daily as needed. (Patient not taking: Reported on 06/04/2023)   timolol (TIMOPTIC) 0.5 % ophthalmic solution 1 drop 2 (two) times daily.   torsemide  (DEMADEX ) 20 MG tablet TAKE 1 TO 2 TABLETS BY MOUTH DAILY AS NEEDED FOR SWELLING   No facility-administered encounter medications on file as of 09/08/2023.   ALLERGIES: Allergies  Allergen Reactions   Acetazolamide    Dicloxacillin Other (See Comments)    GI upset   VACCINATION STATUS: Immunization History  Administered Date(s) Administered   Fluad Quad(high Dose 65+) 01/13/2019, 01/11/2021, 01/15/2022   Influenza Whole 02/09/2013   Influenza, High Dose Seasonal PF 01/20/2017, 01/27/2018, 01/04/2020   Influenza,inj,Quad PF,6+ Mos 01/15/2015, 01/25/2016   Influenza-Unspecified 01/13/2014, 01/09/2023   Moderna SARS-COV2 Booster Vaccination 03/21/2020, 12/11/2020   Moderna Sars-Covid-2 Vaccination 06/16/2019, 07/18/2019   PNEUMOCOCCAL CONJUGATE-20 10/26/2021   Pneumococcal Conjugate-13 01/15/2015   Pneumococcal Polysaccharide-23 01/13/2014   Tdap 10/02/2009, 12/03/2022   Unspecified SARS-COV-2 Vaccination 01/09/2023   Varicella 03/03/2010   Zoster Recombinant(Shingrix) 02/09/2021, 04/10/2021    Thyroid  Problem Presents  for follow-up visit. Patient reports no anxiety, cold intolerance, constipation, depressed mood, diarrhea, fatigue, hair loss, heat intolerance, palpitations, tremors, weight gain or weight loss. The symptoms have been stable.    She is on levothyroxine  125 mcg p.o. daily before breakfast.  She reports consistency and compliance with her medication.  She has no new complaints today. She denies palpitations, tremors, nor heat intolerance. she denies dysphagia, SOB, nor voice change.  She presents with steady weight.    Review of systems  Constitutional: + Minimally fluctuating body weight,  current Body mass index is 32.25 kg/m. , no fatigue,  no subjective hypothermia    Objective:    BP 130/72   Pulse (!) 104   Ht 5\' 6"  (1.676 m)   Wt 199 lb 12.8 oz (90.6  kg)   BMI 32.25 kg/m   Wt Readings from Last 3 Encounters:  09/08/23 199 lb 12.8 oz (90.6 kg)  07/01/23 199 lb (90.3 kg)  05/22/23 200 lb 12.8 oz (91.1 kg)    BP Readings from Last 3 Encounters:  09/08/23 130/72  07/01/23 117/62  05/22/23 122/66      Physical Exam- Limited  Constitutional:  Body mass index is 32.25 kg/m. , not in acute distress, normal state of mind     CMP     Component Value Date/Time   NA 144 06/26/2023 0906   NA 144 12/10/2020 1140   K 3.9 06/26/2023 0906   CL 105 06/26/2023 0906   CO2 27 06/26/2023 0906   GLUCOSE 96 06/26/2023 0906   BUN 16 06/26/2023 0906   BUN 32 (H) 12/10/2020 1140   CREATININE 1.12 (H) 06/26/2023 0906   CREATININE 1.28 (H) 02/10/2020 0926   CALCIUM 9.3 06/26/2023 0906   PROT 7.2 12/10/2020 1140   ALBUMIN 4.5 12/10/2020 1140   AST 27 12/10/2020 1140   ALT 17 12/10/2020 1140   ALKPHOS 118 12/10/2020 1140   BILITOT 0.9 12/10/2020 1140   GFRNONAA 52 (L) 06/26/2023 0906   GFRNONAA 41 (L) 03/28/2019 0744   GFRAA 48 (L) 03/28/2019 0744     Diabetic Labs (most recent): Lab Results  Component Value Date   HGBA1C 5.8 09/08/2023   HGBA1C 5.5 10/09/2022    HGBA1C 5.6 10/08/2021   MICROALBUR 3.6 02/10/2020   MICROALBUR 1.1 11/17/2018   MICROALBUR 0.4 08/09/2018     Lipid Panel ( most recent) Lipid Panel     Component Value Date/Time   CHOL 184 08/04/2023 0843   TRIG 109 08/04/2023 0843   HDL 67 08/04/2023 0843   CHOLHDL 2.7 08/04/2023 0843   CHOLHDL 2.6 02/10/2020 0926   VLDL 17 07/15/2016 0859   LDLCALC 98 08/04/2023 0843   LDLCALC 88 02/10/2020 0926    Recent Results (from the past 2160 hours)  Basic Metabolic Panel (BMET)     Status: Abnormal   Collection Time: 06/26/23  9:06 AM  Result Value Ref Range   Sodium 144 135 - 145 mmol/L   Potassium 3.9 3.5 - 5.1 mmol/L   Chloride 105 98 - 111 mmol/L   CO2 27 22 - 32 mmol/L   Glucose, Bld 96 70 - 99 mg/dL    Comment: Glucose reference range applies only to samples taken after fasting for at least 8 hours.   BUN 16 8 - 23 mg/dL   Creatinine, Ser 7.37 (H) 0.44 - 1.00 mg/dL   Calcium 9.3 8.9 - 10.6 mg/dL   GFR, Estimated 52 (L) >60 mL/min    Comment: (NOTE) Calculated using the CKD-EPI Creatinine Equation (2021)    Anion gap 12 5 - 15    Comment: Performed at [facility], [address] [place], [place] [address]  HM COLONOSCOPY     Status: None   Collection Time: 07/01/23 12:00 AM  Result Value Ref Range   HM Colonoscopy See Report (in chart) See Report (in chart), Patient Reported  Surgical pathology     Status: None   Collection Time: 07/01/23 12:42 PM  Result Value Ref Range   SURGICAL PATHOLOGY      SURGICAL PATHOLOGY CASE: [id number] PATIENT: Ariyan Kizziah Surgical Pathology Report     Clinical History: hx of polyps (tb)     FINAL MICROSCOPIC DIAGNOSIS:  A. COLON, TRANSVERSE, POLYPECTOMY:      Inflammatory type polyp.  Negative for dysplasia.   GROSS DESCRIPTION:  Received in formalin is a tan, soft tissue fragment that is submitted in toto.  Size: 0.3 cm, 1 block submitted.  [facility] 07/01/2023)   Final Diagnosis performed by Dillard Frame, MD.    Electronically signed 07/02/2023 Technical component performed at [facility], [address], [place], [place] [address].  Professional component performed at [employer], [address], [place], [place] [address].  Immunohistochemistry Technical component (if applicable) was performed at [facility]. [address], [place], [place] [address].   IMMUNOHISTOCHEMISTRY DISCLAIMER (if applicable): Some of these immunohistochemical stains may h ave been developed and the performance characteristics determine by [facility]. Some may not have been cleared or approved by the U.S. Food and Drug Administration. The FDA has determined that such clearance or approval is not necessary. This test is used for clinical purposes. It should not be regarded as investigational or for research. This laboratory is certified under the Clinical Laboratory Improvement Amendments of 1988 (CLIA-88) as qualified to perform high complexity clinical laboratory testing.  The controls stained appropriately.   IHC stains are performed on formalin fixed, paraffin embedded tissue using a 3,3"diaminobenzidine (DAB) chromogen and Leica Bond Autostainer System. The staining intensity of the nucleus is score manually and is reported as the percentage of tumor cell nuclei demonstrating specific nuclear staining. The specimens are fixed in 10% Neutral Formalin for at least 6 hours and up to 72hrs. These tests are validated on decalci fied tissue. Results should be interpreted with caution given the possibility of false negative results on decalcified specimens. Antibody Clones are as follows ER-clone 7F, PR-clone 16, Ki67- clone MM1. Some of these immunohistochemical stains may have been developed and the performance characteristics determined by [facility].   TSH     Status: None   Collection Time: 08/04/23  8:43 AM  Result Value Ref  Range   TSH 0.972 0.450 - 4.500 uIU/mL  T4, free     Status: None   Collection Time: 08/04/23  8:43 AM  Result Value Ref Range   Free T4 1.44 0.82 - 1.77 ng/dL  Lipid panel     Status: None   Collection Time: 08/04/23  8:43 AM  Result Value Ref Range   Cholesterol, Total 184 100 - 199 mg/dL   Triglycerides 578 0 - 149 mg/dL   HDL 67 >46 mg/dL   VLDL Cholesterol Cal 19 5 - 40 mg/dL   LDL Chol Calc (NIH) 98 0 - 99 mg/dL   Chol/HDL Ratio 2.7 0.0 - 4.4 ratio    Comment:                                   T. Chol/HDL Ratio                                             Men  Women                               1/2 Avg.Risk  3.4    3.3  Avg.Risk  5.0    4.4                                2X Avg.Risk  9.6    7.1                                3X Avg.Risk 23.4   11.0   HgB A1c     Status: None   Collection Time: 09/08/23 11:09 AM  Result Value Ref Range   Hemoglobin A1C     HbA1c POC (<> result, manual entry)     HbA1c, POC (prediabetic range) 5.8 5.7 - 6.4 %   HbA1c, POC (controlled diabetic range)         Assessment & Plan:   1. 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  She is advised to continue levothyroxine  125 mcg p.o. daily before breakfast.    - We discussed about the correct intake of her thyroid  hormone, on empty stomach at fasting, with water , separated by at least 30 minutes from breakfast and other medications,  and separated by more than 4 hours from calcium, iron, multivitamins, acid reflux medications (PPIs). -Patient is made aware of the fact that thyroid  hormone replacement is needed for life, dose to be adjusted by periodic monitoring of thyroid  function tests.   In light of her history of class 1 Obesity, prediabetes, and hyperlipidemia, she is a good candidate for lifestyle medicine.  Whole food plant-based diet was  discussed with her.    -Her prior visit A1c was 5.8%.  She is not on intervention at this time. She has LDL 98.  She is not on statins yet.  She hesitates to go on statins.    She is trying to engage with whole food plant-based diet.   Her last thyroid  u/s from 01/2017 showed normal size right lobe with no nodules. - no need for additional follow up at this time.   - I advised patient to maintain close follow up with Meldon Sport, MD for primary care needs.  I spent  22  minutes in the care of the patient today including review of labs from Thyroid  Function, CMP, and other relevant labs ; imaging/biopsy records (current and previous including abstractions from other facilities); face-to-face time discussing  her lab results and symptoms, medications doses, her options of short and long term treatment based on the latest standards of care / guidelines;   and documenting the encounter.  Donnis Mckell Davalos  participated in the discussions, expressed understanding, and voiced agreement with the above plans.  All questions were answered to her satisfaction. she is encouraged to contact clinic should she have any questions or concerns prior to her return visit.    Follow up plan: Return in about 6 months (around 03/10/2024) for F/U with Pre-visit Labs, A1c -NV.  Hulon Magic, [facility] [address] [place], [place] [address] Phone: [phone] Fax: [phone]   09/08/2023, 5:35 PM

## 2023-09-08 NOTE — Patient Instructions (Signed)

## 2023-09-14 ENCOUNTER — Other Ambulatory Visit: Payer: Self-pay | Admitting: Internal Medicine

## 2023-09-14 DIAGNOSIS — I10 Essential (primary) hypertension: Secondary | ICD-10-CM

## 2023-10-02 ENCOUNTER — Encounter: Payer: Self-pay | Admitting: Podiatry

## 2023-10-02 ENCOUNTER — Ambulatory Visit (INDEPENDENT_AMBULATORY_CARE_PROVIDER_SITE_OTHER): Admitting: Podiatry

## 2023-10-02 DIAGNOSIS — M79675 Pain in left toe(s): Secondary | ICD-10-CM

## 2023-10-02 DIAGNOSIS — M79674 Pain in right toe(s): Secondary | ICD-10-CM | POA: Diagnosis not present

## 2023-10-02 DIAGNOSIS — B351 Tinea unguium: Secondary | ICD-10-CM | POA: Diagnosis not present

## 2023-10-03 NOTE — Progress Notes (Signed)
 Subjective:   Patient ID: Danielle Howard, female   DOB: 73 y.o.   MRN: [id number]   HPI Patient states her nails have gotten thickened and she is concerned about this and has been going on for a few years she would like to know what she can do it may become painful neuro vas   ROS      Objective:  Physical Exam  Status intact with thick yellow brittle nailbeds 1-5 both feet that have become thickened over time but it has been over a long period of time they are moderately painful hard for her to cut     Assessment:  Mycotic nail infection with pain 1-5 both feet     Plan:  H&P education rendered do not see medicines or laser that would be valuable today because of the discomfort thickness I did debride nailbeds 1-5 both feet this can be done routinely and she can also get pedicures

## 2023-10-05 DIAGNOSIS — H35033 Hypertensive retinopathy, bilateral: Secondary | ICD-10-CM | POA: Diagnosis not present

## 2023-10-05 DIAGNOSIS — H401112 Primary open-angle glaucoma, right eye, moderate stage: Secondary | ICD-10-CM | POA: Diagnosis not present

## 2023-10-05 DIAGNOSIS — H4042X3 Glaucoma secondary to eye inflammation, left eye, severe stage: Secondary | ICD-10-CM | POA: Diagnosis not present

## 2023-10-13 DIAGNOSIS — H35372 Puckering of macula, left eye: Secondary | ICD-10-CM | POA: Diagnosis not present

## 2023-10-13 DIAGNOSIS — H20042 Secondary noninfectious iridocyclitis, left eye: Secondary | ICD-10-CM | POA: Diagnosis not present

## 2023-10-13 DIAGNOSIS — Z79899 Other long term (current) drug therapy: Secondary | ICD-10-CM | POA: Diagnosis not present

## 2023-10-13 DIAGNOSIS — Z961 Presence of intraocular lens: Secondary | ICD-10-CM | POA: Diagnosis not present

## 2023-10-13 DIAGNOSIS — E1136 Type 2 diabetes mellitus with diabetic cataract: Secondary | ICD-10-CM | POA: Diagnosis not present

## 2023-10-13 DIAGNOSIS — T8521XA Breakdown (mechanical) of intraocular lens, initial encounter: Secondary | ICD-10-CM | POA: Diagnosis not present

## 2023-10-13 DIAGNOSIS — H2511 Age-related nuclear cataract, right eye: Secondary | ICD-10-CM | POA: Diagnosis not present

## 2023-10-13 DIAGNOSIS — H35033 Hypertensive retinopathy, bilateral: Secondary | ICD-10-CM | POA: Diagnosis not present

## 2023-10-19 ENCOUNTER — Ambulatory Visit (INDEPENDENT_AMBULATORY_CARE_PROVIDER_SITE_OTHER): Payer: Medicare Other

## 2023-10-19 VITALS — Ht 66.0 in | Wt 200.0 lb

## 2023-10-19 DIAGNOSIS — Z636 Dependent relative needing care at home: Secondary | ICD-10-CM

## 2023-10-19 DIAGNOSIS — Z780 Asymptomatic menopausal state: Secondary | ICD-10-CM

## 2023-10-19 DIAGNOSIS — Z0000 Encounter for general adult medical examination without abnormal findings: Secondary | ICD-10-CM

## 2023-10-19 DIAGNOSIS — Z789 Other specified health status: Secondary | ICD-10-CM

## 2023-10-19 DIAGNOSIS — Z1231 Encounter for screening mammogram for malignant neoplasm of breast: Secondary | ICD-10-CM

## 2023-10-19 NOTE — Patient Instructions (Signed)
 Danielle Howard , Thank you for taking time out of your busy schedule to complete your Annual Wellness Visit with me. I enjoyed our conversation and look forward to speaking with you again next year. I, as well as your care team,  appreciate your ongoing commitment to your health goals. Please review the following plan we discussed and let me know if I can assist you in the future. Your Game plan/ To Do List    Referrals: If you haven't heard from the office you've been referred to, please reach out to them at the phone provided.   A referral has been placed for you to check and see what additional resources are available to you.   If you haven't heard from anyone within the next 7 business days, please call them and let them know a referral has been placed for you Phone: [phone] Osteoporosis Screening: Please call the number below to schedule your appt. [facility] Phone: [phone] Yearly Mammogram Cristine Done Please call the number below to schedule your appt. [facility] Phone: [phone]   Follow up Visits: Next Medicare AWV with our clinical staff: October 19, 2024 at 9:20 am video visit   Have you seen your provider in the last 6 months (3 months if uncontrolled diabetes)? Yes Next Office Visit with your provider: November 19, 2023 at 10:40 am in office  Clinician Recommendations:  Aim for 30 minutes of exercise or brisk walking, 6-8 glasses of water , and 5 servings of fruits and vegetables each day.       This is a list of the screening recommended for you and due dates:  Health Maintenance  Topic Date Due   DEXA scan (bone density measurement)  10/03/2016   COVID-19 Vaccine (4 - Mixed Product risk 2024-25 season) 07/09/2023   Mammogram  11/20/2023   Flu Shot  12/04/2023   Medicare Annual Wellness Visit  10/18/2024   Colon Cancer Screening  06/30/2028   DTaP/Tdap/Td vaccine (3 - Td or Tdap) 12/02/2032   Pneumococcal Vaccine  for age over 33  Completed   Hepatitis C Screening  Completed   Zoster (Shingles) Vaccine  Completed   HPV Vaccine  Aged Out   Meningitis B Vaccine  Aged Out    Advanced directives: (Declined) Advance directive discussed with you today. Even though you declined this today, please call our office should you change your mind, and we can give you the proper paperwork for you to fill out. Advance Care Planning is important because it:  [x]  Makes sure you receive the medical care that is consistent with your values, goals, and preferences  [x]  It provides guidance to your family and loved ones and reduces their decisional burden about whether or not they are making the right decisions based on your wishes.  Follow the link provided in your after visit summary or read over the paperwork we have mailed to you to help you started getting your Advance Directives in place. If you need assistance in completing these, please reach out to us  so that we can help you!  Understanding Your Risk for Falls Millions of people have serious injuries from falls each year. It is important to understand your risk of falling. Talk with your health care provider about your risk and what you can do to lower it. If you do have a serious fall, make sure to tell your provider. Falling once raises your risk of falling again.  How can falls affect me? Serious injuries from falls are common. These include: Broken bones, such as hip fractures. Head injuries, such as traumatic brain injuries (TBI) or concussions. A fear of falling can cause you to avoid activities and stay at home. This can make your muscles weaker and raise your risk for a fall. What can increase my risk? There are a number of risk factors that increase your risk for falling. The more risk factors you have, the higher your risk of falling. Serious injuries from a fall happen most often to people who are older than 75 years old. Teenagers and young adults ages  68-29 are also at higher risk. Common risk factors include: Weakness in the lower body. Being generally weak or confused due to long-term (chronic) illness. Dizziness or balance problems. Poor vision. Medicines that cause dizziness or drowsiness. These may include: Medicines for your blood pressure, heart, anxiety, insomnia, or swelling (edema). Pain medicines. Muscle relaxants. Other risk factors include: Drinking alcohol. Having had a fall in the past. Having foot pain or wearing improper footwear. Working at a dangerous job. Having any of the following in your home: Tripping hazards, such as floor clutter or loose rugs. Poor lighting. Pets. Having dementia or memory loss. What actions can I take to lower my risk of falling?     Physical activity Stay physically fit. Do strength and balance exercises. Consider taking a regular class to build strength and balance. Yoga and tai chi are good options. Vision Have your eyes checked every year and your prescription for glasses or contacts updated as needed. Shoes and walking aids Wear non-skid shoes. Wear shoes that have rubber soles and low heels. Do not wear high heels. Do not walk around the house in socks or slippers. Use a cane or walker as told by your provider. Home safety Attach secure railings on both sides of your stairs. Install grab bars for your bathtub, shower, and toilet. Use a non-skid mat in your bathtub or shower. Attach bath mats securely with double-sided, non-slip rug tape. Use good lighting in all rooms. Keep a flashlight near your bed. Make sure there is a clear path from your bed to the bathroom. Use night-lights. Do not use throw rugs. Make sure all carpeting is taped or tacked down securely. Remove all clutter from walkways and stairways, including extension cords. Repair uneven or broken steps and floors. Avoid walking on icy or slippery surfaces. Walk on the grass instead of on icy or slick sidewalks.  Use ice melter to get rid of ice on walkways in the winter. Use a cordless phone. Questions to ask your health care provider Can you help me check my risk for a fall? Do any of my medicines make me more likely to fall? Should I take a vitamin D supplement? What exercises can I do to improve my strength and balance? Should I make an appointment to have my vision checked? Do I need a bone density test to check for weak bones (osteoporosis)? Would it help to use a cane or a walker? Where to find more information Centers for Disease Control and Prevention, STEAD[employer]: TonerPromos.no Community-Based Fall Prevention Programs: TonerPromos.no [employer] on Aging: BaseRingTones.pl Contact a health care provider if: You fall at home. You are afraid of falling at home. You feel weak, drowsy, or dizzy. This information is not intended to replace advice given to you by your health care provider. Make sure you discuss any questions you have with your health care  provider. Document Revised: 12/23/2021 Document Reviewed: 12/23/2021 Elsevier Patient Education  2024 [employer].

## 2023-10-19 NOTE — Progress Notes (Signed)
 Subjective:   Danielle Howard is a 73 y.o. who presents for a Medicare Wellness preventive visit.  As a reminder, Annual Wellness Visits don't include a physical exam, and some assessments may be limited, especially if this visit is performed virtually. We may recommend an in-person follow-up visit with your provider if needed.  Visit Complete: Virtual I connected with  Danielle Howard on 10/19/23 by a audio enabled telemedicine application and verified that I am speaking with the correct person using two identifiers.  Patient Location: Home  Provider Location: Home Office  I discussed the limitations of evaluation and management by telemedicine. The patient expressed understanding and agreed to proceed.  Vital Signs: Because this visit was a virtual/telehealth visit, some criteria may be missing or patient reported. Any vitals not documented were not able to be obtained and vitals that have been documented are patient reported.  VideoDeclined- This patient declined Librarian, academic. Therefore the visit was completed with audio only.  Persons Participating in Visit: Patient.  AWV Questionnaire: Yes: Patient Medicare AWV questionnaire was completed by the patient on 10/19/2023; I have confirmed that all information answered by patient is correct and no changes since this date.  Cardiac Risk Factors include: advanced age (>68men, >34 women);hypertension;obesity (BMI >30kg/m2)     Objective:    Today's Vitals   10/19/23 0944  Weight: 200 lb (90.7 kg)  Height: 5' 6 (1.676 m)   Body mass index is 32.28 kg/m.     10/19/2023    9:55 AM 07/01/2023   11:15 AM 10/13/2022   10:39 AM 08/22/2022   10:46 AM 10/08/2021   10:33 AM 10/03/2020    9:49 AM 07/05/2020    6:05 PM  Advanced Directives  Does Patient Have a Medical Advance Directive? No No No No No No No  Would patient like information on creating a medical advance directive? No - Patient declined No - Patient  declined No - Patient declined No - Patient declined Yes (MAU/Ambulatory/Procedural Areas - Information given) No - Patient declined No - Patient declined    Current Medications (verified) Outpatient Encounter Medications as of 10/19/2023  Medication Sig   amLODipine (NORVASC) 5 MG tablet Take 5 mg by mouth daily.   brimonidine (ALPHAGAN) 0.15 % ophthalmic solution Place 1 drop into both eyes 3 (three) times daily.   Calcium Carbonate-Vit D-Min (CALCIUM 1200 PO) Take by mouth.   diclofenac  Sodium (VOLTAREN ) 1 % GEL Apply 4 g topically 4 (four) times daily.   dorzolamide (TRUSOPT) 2 % ophthalmic solution Place 1 drop into the left eye 2 (two) times daily.   folic acid  (FOLVITE ) 1 MG tablet Take 1 mg by mouth daily.    levothyroxine  (SYNTHROID ) 125 MCG tablet TAKE 1 TABLET(125 MCG) BY MOUTH DAILY BEFORE BREAKFAST   lisinopril  (ZESTRIL ) 10 MG tablet TAKE 1 TABLET(10 MG) BY MOUTH DAILY   Multiple Vitamins-Minerals (MULTIVITAMIN WITH MINERALS) tablet Take 1 tablet by mouth daily.   Na Sulfate-K Sulfate-Mg Sulfate concentrate 17.5-3.13-1.6 GM/177ML SOLN Use as directed   potassium chloride  SA (KLOR-CON  M) 20 MEQ tablet TAKE 1 TABLET BY MOUTH EVERY DAY   promethazine -dextromethorphan (PROMETHAZINE -DM) 6.25-15 MG/5ML syrup Take 5 mLs by mouth 4 (four) times daily as needed.   timolol (TIMOPTIC) 0.5 % ophthalmic solution 1 drop 2 (two) times daily.   torsemide  (DEMADEX ) 20 MG tablet TAKE 1 TO 2 TABLETS BY MOUTH DAILY AS NEEDED FOR SWELLING   No facility-administered encounter medications on file as of 10/19/2023.  Allergies (verified) Acetazolamide and Dicloxacillin   History: Past Medical History:  Diagnosis Date   Arthritis    Cataract    Complication of anesthesia    nausea   Glaucoma    Headache    Hypertension    Hypothyroidism    Hypothyroidism    Iritis    Thyroid  disease    Uveitis of left eye 12/29/2017   Past Surgical History:  Procedure Laterality Date   ABDOMINAL  HYSTERECTOMY     CATARACT EXTRACTION Left 2019   CATARACT EXTRACTION Right 06/12/2022   CHOLECYSTECTOMY     02/2012   COLONOSCOPY N/A 05/25/2013   Procedure: COLONOSCOPY;  Surgeon: Ruby Corporal, MD;  [facility];  Service: Endoscopy;  Laterality: N/A;  830-moved to 1055 Ann to notify pt   COLONOSCOPY N/A 06/21/2018   Procedure: COLONOSCOPY;  Surgeon: Ruby Corporal, MD;  [facility];  Service: Endoscopy;  Laterality: N/A;  1020   COLONOSCOPY WITH PROPOFOL  N/A 07/01/2023   Procedure: COLONOSCOPY WITH PROPOFOL ;  Surgeon: Hargis Lias, MD;  [facility];  Service: Endoscopy;  Laterality: N/A;  12:30PM;ASA 1-2   CYST EXCISION  08/21/2022   EYE SURGERY N/A    [employer] 07/13/2020   JOINT REPLACEMENT Bilateral    2012,2013-bilateral knees   POLYPECTOMY  06/21/2018   Procedure: POLYPECTOMY;  Surgeon: Ruby Corporal, MD;  [facility];  Service: Endoscopy;;   POLYPECTOMY  07/01/2023   Procedure: POLYPECTOMY;  Surgeon: Hargis Lias, MD;  [facility];  Service: Endoscopy;;   THYROID  SURGERY     Family History  Problem Relation Age of Onset   Arthritis Mother    Heart disease Mother    Hypertension Mother    Arthritis Father    Hyperlipidemia Father    HIV/AIDS Brother    Social History   Socioeconomic History   Marital status: Single    Spouse name: Not on file   Number of children: Not on file   Years of education: Not on file   Highest education level: Not on file  Occupational History   Not on file  Tobacco Use   Smoking status: Never   Smokeless tobacco: Never  Vaping Use   Vaping status: Never Used  Substance and Sexual Activity   Alcohol use: Not Currently    Comment: occasional   Drug use: No   Sexual activity: Not Currently  Other Topics Concern   Not on file  Social History Narrative   Not on file   Social Drivers of Health   Financial Resource Strain: Low Risk  (10/19/2023)   Overall  Financial Resource Strain (CARDIA)    Difficulty of Paying Living Expenses: Not hard at all  Food Insecurity: No Food Insecurity (10/19/2023)   Hunger Vital Sign    Worried About Running Out of Food in the Last Year: Never true    Ran Out of Food in the Last Year: Never true  Transportation Needs: No Transportation Needs (10/19/2023)   PRAPARE - Administrator, Civil Service (Medical): No    Lack of Transportation (Non-Medical): No  Physical Activity: Sufficiently Active (10/19/2023)   Exercise Vital Sign    Days of Exercise per Week: 7 days    Minutes of Exercise per Session: 30 min  Stress: Stress Concern Present (10/19/2023)   [employer] of Occupational Health - Occupational Stress Questionnaire    Feeling of Stress: To some extent  Social Connections:  Unknown (10/19/2023)   Social Connection and Isolation Panel    Frequency of Communication with Friends and Family: Twice a week    Frequency of Social Gatherings with Friends and Family: Never    Attends Religious Services: More than 4 times per year    Active Member of [employer] or Organizations: Patient declined    Attends Engineer, structural: Patient declined    Marital Status: Never married    Tobacco Counseling Counseling given: Yes    Clinical Intake:  Pre-visit preparation completed: Yes  Pain : No/denies pain     BMI - recorded: 32.28 Nutritional Status: BMI > 30  Obese Nutritional Risks: None Diabetes: No  Lab Results  Component Value Date   HGBA1C 5.8 09/08/2023   HGBA1C 5.5 10/09/2022   HGBA1C 5.6 10/08/2021     How often do you need to have someone help you when you read instructions, pamphlets, or other written materials from your doctor or pharmacy?: 1 - Never  Interpreter Needed?: No  Information entered by :: Sally Crazier CMA   Activities of Daily Living     10/19/2023    9:11 AM  In your present state of health, do you have any difficulty performing the following  activities:  Hearing? 0  Vision? 0  Difficulty concentrating or making decisions? 0  Walking or climbing stairs? 0  Dressing or bathing? 0  Doing errands, shopping? 0  Preparing Food and eating ? N  Using the Toilet? N  In the past six months, have you accidently leaked urine? N  Do you have problems with loss of bowel control? N  Managing your Medications? N  Managing your Finances? N  Housekeeping or managing your Housekeeping? N    Patient Care Team: Meldon Sport, MD as PCP - General (Internal Medicine) Amanda Jungling Joyceann No, MD as Consulting Physician (Cardiology) Jane Meager, MD as Referring Physician (Nephrology) Baby Bolt, MD as Consulting Physician (Endocrinology) Celia Coles Angus Kenning, DPM as Consulting Physician (Podiatry) Edna Gouty, MD as Referring Physician (Ophthalmology) Alita Irwin Forbes Ida, MD as Consulting Physician (Gastroenterology) Seward Dao Alleen Arbour, MD as Consulting Physician (Ophthalmology) Wendel Hals, NP as Consulting Physician (Endocrinology)  I have updated your Care Teams any recent Medical Services you may have received from other providers in the past year.     Assessment:   This is a routine wellness examination for Danielle Howard.  Hearing/Vision screen Hearing Screening - Comments:: Patient denies any hearing difficulties.   Vision Screening - Comments:: Wears rx glasses - up to date with routine eye exams  Patient sees Dr. Mason Sole in [place]    Goals Addressed             This Visit's Progress    Patient Stated       Continue working on losing weight       Depression Screen     10/19/2023    9:47 AM 05/22/2023   10:15 AM 11/19/2022   10:29 AM 10/13/2022   10:40 AM 10/13/2022   10:39 AM 05/19/2022   10:24 AM 11/14/2021   10:42 AM  PHQ 2/9 Scores  PHQ - 2 Score 0 0 0 0 0 0 0  PHQ- 9 Score 6 0         Fall Risk     10/19/2023    9:11 AM 05/22/2023   10:14 AM 11/19/2022   10:28 AM 10/13/2022   10:39 AM 10/09/2022     9:49 PM  Fall Risk  Falls in the past year? 1 0 1 1 0  Number falls in past yr: 0 0 0 0 1  Injury with Fall? 0 0 0 0 0  Risk for fall due to : No Fall Risks No Fall Risks  History of fall(s)   Follow up Falls evaluation completed Falls evaluation completed  Falls evaluation completed     MEDICARE RISK AT HOME:  Medicare Risk at Home Any stairs in or around the home?: (Patient-Rptd) No If so, are there any without handrails?: (Patient-Rptd) No Home free of loose throw rugs in walkways, pet beds, electrical cords, etc?: (Patient-Rptd) Yes Adequate lighting in your home to reduce risk of falls?: (Patient-Rptd) Yes Life alert?: (Patient-Rptd) No Use of a cane, walker or w/c?: (Patient-Rptd) No Grab bars in the bathroom?: (Patient-Rptd) Yes Shower chair or bench in shower?: (Patient-Rptd) Yes Elevated toilet seat or a handicapped toilet?: (Patient-Rptd) Yes  TIMED UP AND GO:  Was the test performed?  No  Cognitive Function: 6CIT completed    10/13/2022   10:41 AM 10/13/2022   10:40 AM 10/08/2021   10:34 AM  MMSE - Mini Mental State Exam  Not completed: Unable to complete Unable to complete Unable to complete        10/19/2023   10:05 AM 10/13/2022   10:41 AM 10/08/2021   10:34 AM  6CIT Screen  What Year? 0 points 0 points 0 points  What month? 0 points 0 points 0 points  What time? 0 points 0 points 0 points  Count back from 20 0 points 0 points 0 points  Months in reverse 0 points 0 points 0 points  Repeat phrase 0 points 0 points 0 points  Total Score 0 points 0 points 0 points    Immunizations Immunization History  Administered Date(s) Administered   Fluad Quad(high Dose 65+) 01/13/2019, 01/11/2021, 01/15/2022   Influenza Whole 02/09/2013   Influenza, High Dose Seasonal PF 01/20/2017, 01/27/2018, 01/04/2020   Influenza,inj,Quad PF,6+ Mos 01/15/2015, 01/25/2016   Influenza-Unspecified 01/13/2014, 01/09/2023   Moderna SARS-COV2 Booster Vaccination 03/21/2020, 12/11/2020    Moderna Sars-Covid-2 Vaccination 06/16/2019, 07/18/2019   PNEUMOCOCCAL CONJUGATE-20 10/26/2021   Pneumococcal Conjugate-13 01/15/2015   Pneumococcal Polysaccharide-23 01/13/2014   Tdap 10/02/2009, 12/03/2022   Unspecified SARS-COV-2 Vaccination 01/09/2023   Varicella 03/03/2010   Zoster Recombinant(Shingrix) 02/09/2021, 04/10/2021    Screening Tests Health Maintenance  Topic Date Due   DEXA SCAN  10/03/2016   COVID-19 Vaccine (4 - Mixed Product risk 2024-25 season) 07/09/2023   MAMMOGRAM  11/20/2023   INFLUENZA VACCINE  12/04/2023   Medicare Annual Wellness (AWV)  10/18/2024   Colonoscopy  06/30/2028   DTaP/Tdap/Td (3 - Td or Tdap) 12/02/2032   Pneumococcal Vaccine: 50+ Years  Completed   Hepatitis C Screening  Completed   Zoster Vaccines- Shingrix  Completed   HPV VACCINES  Aged Out   Meningococcal B Vaccine  Aged Out    Health Maintenance  Health Maintenance Due  Topic Date Due   DEXA SCAN  10/03/2016   COVID-19 Vaccine (4 - Mixed Product risk 2024-25 season) 07/09/2023   Health Maintenance Items Addressed: Mammogram ordered, DEXA ordered, CRR referral placed to assist with resources associated with caregiver burden/stress. Patient aware and agrees with treatment plan  Additional Screening:  Vision Screening: Recommended annual ophthalmology exams for early detection of glaucoma and other disorders of the eye. Would you like a referral to an eye doctor? No    Dental Screening: Recommended annual dental exams for proper oral  hygiene  Community Resource Referral / Chronic Care Management: CRR required this visit?  Yes   CCM required this visit?  No   Plan:  Mammogram ordered, DEXA ordered, CRR referral placed to assist with resources associated with caregiver burden/stress. Patient aware and agrees with treatment plan    I have personally reviewed and noted the following in the patient's chart:   Medical and social history Use of alcohol, tobacco or  illicit drugs  Current medications and supplements including opioid prescriptions. Patient is not currently taking opioid prescriptions. Functional ability and status Nutritional status Physical activity Advanced directives List of other physicians Hospitalizations, surgeries, and ER visits in previous 12 months Vitals Screenings to include cognitive, depression, and falls Referrals and appointments  In addition, I have reviewed and discussed with patient certain preventive protocols, quality metrics, and best practice recommendations. A written personalized care plan for preventive services as well as general preventive health recommendations were provided to patient.   Cynthie Garmon, CMA   10/19/2023   After Visit Summary: (Declined) Due to this being a telephonic visit, with patients personalized plan was offered to patient but patient Declined AVS at this time   Notes: Mammogram ordered, DEXA ordered, CRR referral placed to assist with resources associated with caregiver burden/stress. Patient aware and agrees with treatment plan

## 2023-10-20 ENCOUNTER — Other Ambulatory Visit: Payer: Self-pay | Admitting: "Endocrinology

## 2023-10-20 ENCOUNTER — Telehealth: Payer: Self-pay

## 2023-10-20 NOTE — Progress Notes (Signed)
 Complex Care Management Note  Care Guide Note 10/20/2023 Name: Danielle Howard MRN: [id number] DOB: 1950/09/30  Danielle Howard is a 73 y.o. year old female who sees Meldon Sport, MD for primary care. I reached out to Aaron Aas by phone today to offer complex care management services.  Ms. Biever was given information about Complex Care Management services today including:   The Complex Care Management services include support from the care team which includes your Nurse Care Manager, Clinical Social Worker, or Pharmacist.  The Complex Care Management team is here to help remove barriers to the health concerns and goals most important to you. Complex Care Management services are voluntary, and the patient may decline or stop services at any time by request to their care team member.   Complex Care Management Consent Status: Patient agreed to services and verbal consent obtained.   Follow up plan:  Telephone appointment with complex care management team member scheduled for:  10/27/2023  Encounter Outcome:  Patient Scheduled  Lenton Rail , RMA     [place]  [facility], [facility] Guide  Direct Dial: [phone]  Website: [email or web address]

## 2023-10-27 ENCOUNTER — Other Ambulatory Visit: Payer: Self-pay | Admitting: Licensed Clinical Social Worker

## 2023-10-27 NOTE — Patient Outreach (Addendum)
 Complex Care Management   Visit Note  10/27/2023  Name:  Danielle Howard MRN: [id number] DOB: 1950-08-27  Situation: Referral received for Complex Care Management related to caregiver support resources I obtained verbal consent from Patient.  Visit completed with VBCI LCSW  on the phone  Background:   Past Medical History:  Diagnosis Date   Arthritis    Cataract    Complication of anesthesia    nausea   Glaucoma    Headache    Hypertension    Hypothyroidism    Hypothyroidism    Iritis    Thyroid  disease    Uveitis of left eye 12/29/2017    Assessment:  SDOH Screenings   Food Insecurity: No Food Insecurity (10/27/2023)  Housing: Low Risk  (10/27/2023)  Transportation Needs: No Transportation Needs (10/19/2023)  Utilities: Not At Risk (10/19/2023)  Alcohol Screen: Low Risk  (10/19/2023)  Depression (PHQ2-9): Low Risk  (10/27/2023)  Recent Concern: Depression (PHQ2-9) - Medium Risk (10/19/2023)  Financial Resource Strain: Medium Risk (10/27/2023)  Physical Activity: Sufficiently Active (10/19/2023)  Social Connections: Unknown (10/19/2023)  Stress: Stress Concern Present (10/27/2023)  Tobacco Use: Low Risk  (10/19/2023)  Health Literacy: Adequate Health Literacy (10/19/2023)   Patient Reported Symptoms: Patient reports that she does not need case management services but rather her mother may benefit from program instead. Education provided on our [employer], services and enrollment process. Patient reports that she asked for this referral from her PCP because she is having stress regarding her mother's care taking and would like resource education. She denies wanting any behavioral health support or treatment at this time. She wrote down contact number in case she changes her mind and desires further social work services in the future.   Patient's main goal in this session is to find an aide to bathe her mother free of charge. Patient is aware that this resource is limited. In Home Aide  and [facility] program resource education provided. Patient shares that she lives across the street from her mother who she provides care for. She reports that her mother does not qualify for Medicaid and no longer receives PCS. She reports that her mother is involved with in home hospice care but they cannot arrive to give her mother a bath until after 11 am in the day and she does not wish to wait until then and prefers for her mother to have a bath between 8 am- 930 am so she can get ready for her day. Pt does not wish to pay for in home services for her mother and understands there is a wait list for these no cost aide resources that were provided during session today. Resource education provided to patient by email. VBCI LCSW will update PCP and close case.     10/27/2023    1:14 PM  Depression screen PHQ 2/9  Decreased Interest 0  Down, Depressed, Hopeless 0  PHQ - 2 Score 0  Altered sleeping 2  Tired, decreased energy 2  Change in appetite 0  Feeling bad or failure about yourself  0  Trouble concentrating 0  Moving slowly or fidgety/restless 0  Suicidal thoughts 0  PHQ-9 Score 4  Difficult doing work/chores Not difficult at all    There were no vitals filed for this visit.  Medications Reviewed Today     Reviewed by Merlynn Lyle CROME, LCSW (Social Worker) on 10/27/23 at 1310  Med List Status: <None>   Medication Order Taking? Sig Documenting Provider Last Dose Status  Informant  amLODipine (NORVASC) 5 MG tablet [id number]  Take 5 mg by mouth daily. [provider]  Active   brimonidine (ALPHAGAN) 0.15 % ophthalmic solution [id number]  Place 1 drop into both eyes 3 (three) times daily. Bari Theodoro FALCON, MD  Active   Calcium Carbonate-Vit D-Min (CALCIUM 1200 PO) [id number]  Take by mouth. [provider]  Active   diclofenac  Sodium (VOLTAREN ) 1 % GEL [id number]  Apply 4 g topically 4 (four) times daily. Tobie Suzzane POUR, MD  Active   dorzolamide (TRUSOPT) 2 % ophthalmic  solution [id number]  Place 1 drop into the left eye 2 (two) times daily. [provider]  Active   folic acid  (FOLVITE ) 1 MG tablet [id number]  Take 1 mg by mouth daily.  [provider]  Active Self  levothyroxine  (SYNTHROID ) 125 MCG tablet [id number]  TAKE 1 TABLET(125 MCG) BY MOUTH DAILY BEFORE BREAKFAST Nida, Gebreselassie W, MD  Active   lisinopril  (ZESTRIL ) 10 MG tablet [id number]  TAKE 1 TABLET(10 MG) BY MOUTH DAILY Patel, Rutwik K, MD  Active   Multiple Vitamins-Minerals (MULTIVITAMIN WITH MINERALS) tablet [id number]  Take 1 tablet by mouth daily. [provider]  Active Self  Na Sulfate-K Sulfate-Mg Sulfate concentrate 17.5-3.13-1.6 GM/177ML SOLN [id number]  Use as directed Ahmed, Muhammad F, MD  Active   potassium chloride  SA (KLOR-CON  M) 20 MEQ tablet [id number]  TAKE 1 TABLET BY MOUTH EVERY DAY Patel, Rutwik K, MD  Active   promethazine -dextromethorphan (PROMETHAZINE -DM) 6.25-15 MG/5ML syrup [id number]  Take 5 mLs by mouth 4 (four) times daily as needed. Stuart Vernell Norris, PA-C  Active   timolol (TIMOPTIC) 0.5 % ophthalmic solution [id number]  1 drop 2 (two) times daily. [provider]  Active   torsemide  (DEMADEX ) 20 MG tablet [id number]  TAKE 1 TO 2 TABLETS BY MOUTH DAILY AS NEEDED FOR SWELLING Tobie Suzzane POUR, MD  Active             Recommendation:   Continue Current Plan of Care  Follow Up Plan:   Patient has met all care management goals. Care Management case will be closed. Patient has been provided contact information should new needs arise.   Lyle Rung, BSW, MSW, LCSW Licensed Clinical Social Worker [employer] [facility]   [email or web address] Direct Dial: [phone]

## 2023-10-27 NOTE — Patient Instructions (Signed)
 Visit Information  Thank you for taking time to visit with me today. Please don't hesitate to contact me if I can be of assistance to you   Please call the Suicide and Crisis Lifeline: 988 call the [facility]: [phone] if you are experiencing a Mental Health or Behavioral Health Crisis or need someone to talk to.  Patient verbalizes understanding of instructions and care plan provided today and agrees to view in MyChart. Active MyChart status and patient understanding of how to access instructions and care plan via MyChart confirmed with patient.     Lyle Rung, BSW, MSW, LCSW Licensed Clinical Social Worker [employer] [facility]   [email or web address] Direct Dial: [phone]

## 2023-11-19 ENCOUNTER — Other Ambulatory Visit (HOSPITAL_COMMUNITY): Payer: Self-pay

## 2023-11-19 ENCOUNTER — Ambulatory Visit (INDEPENDENT_AMBULATORY_CARE_PROVIDER_SITE_OTHER): Payer: Medicare Other | Admitting: Internal Medicine

## 2023-11-19 ENCOUNTER — Encounter: Payer: Self-pay | Admitting: Internal Medicine

## 2023-11-19 ENCOUNTER — Telehealth: Payer: Self-pay | Admitting: Pharmacy Technician

## 2023-11-19 VITALS — BP 134/78 | HR 102 | Ht 66.0 in | Wt 191.4 lb

## 2023-11-19 DIAGNOSIS — F411 Generalized anxiety disorder: Secondary | ICD-10-CM | POA: Insufficient documentation

## 2023-11-19 DIAGNOSIS — G4700 Insomnia, unspecified: Secondary | ICD-10-CM | POA: Insufficient documentation

## 2023-11-19 DIAGNOSIS — N1831 Chronic kidney disease, stage 3a: Secondary | ICD-10-CM

## 2023-11-19 DIAGNOSIS — Z636 Dependent relative needing care at home: Secondary | ICD-10-CM | POA: Insufficient documentation

## 2023-11-19 DIAGNOSIS — I10 Essential (primary) hypertension: Secondary | ICD-10-CM

## 2023-11-19 DIAGNOSIS — R7303 Prediabetes: Secondary | ICD-10-CM

## 2023-11-19 DIAGNOSIS — E890 Postprocedural hypothyroidism: Secondary | ICD-10-CM

## 2023-11-19 MED ORDER — HYDROXYZINE HCL 10 MG PO TABS
10.0000 mg | ORAL_TABLET | Freq: Two times a day (BID) | ORAL | 0 refills | Status: DC | PRN
Start: 2023-11-19 — End: 2024-03-24

## 2023-11-19 MED ORDER — MIRTAZAPINE 7.5 MG PO TABS: 7.5000 mg | ORAL_TABLET | Freq: Every day | ORAL | 3 refills | Status: AC

## 2023-11-19 NOTE — Assessment & Plan Note (Signed)
 Likely worse in the setting of caregiver stress Started mirtazapine  7.5 mg nightly for GAD and insomnia Hydroxyzine  10 mg BID as needed for anxiety

## 2023-11-19 NOTE — Assessment & Plan Note (Signed)
 Added Mirtazapine  for insomnia Hydroxyzine  10 mg BID as needed for anxiety

## 2023-11-19 NOTE — Telephone Encounter (Signed)
 Pharmacy Patient Advocate Encounter   Received notification from CoverMyMeds that prior authorization for hydrOXYzine  HCl 10MG  tablets is required/requested.   Insurance verification completed.   The patient is insured through [employer] .   Per test claim: PA required; PA submitted to above mentioned insurance via LATENT Key/confirmation #/EOC [id number] Status is pending

## 2023-11-19 NOTE — Patient Instructions (Signed)
 Please start taking Mirtazepine at bedtime for insomnia.  Please take Hydroxyzine  as needed for anxiety.  Please continue to take medications as prescribed.  Please continue to follow low salt diet and perform moderate exercise/walking as tolerated.

## 2023-11-19 NOTE — Assessment & Plan Note (Signed)
On Levothyroxine 125 mcg QD Follows up with Endocrinology - Dr Fransico Him

## 2023-11-19 NOTE — Assessment & Plan Note (Addendum)
 BP Readings from Last 1 Encounters:  11/19/23 134/78   Well-controlled with lisinopril  10 mg QD and amlodipine 5 mg QD now Followed by Nephrology Counseled for compliance with the medications Advised DASH diet and moderate exercise/walking as tolerated

## 2023-11-19 NOTE — Assessment & Plan Note (Signed)
 Lab Results  Component Value Date   HGBA1C 5.8 09/08/2023   Advised to follow low-carb diet for now

## 2023-11-19 NOTE — Assessment & Plan Note (Signed)
 She is sole caregiver for her mother, which has increased her anxiety She has crying spells at times due to excessive stress Hydroxyzine  10 mg BID as needed for anxiety

## 2023-11-19 NOTE — Assessment & Plan Note (Signed)
 Last BMP reviewed - GFR stable at 52 She has stopped taking soft drinks cyclosporine and acyclovir discontinued Advised to maintain proper hydration Avoid nephrotoxic agents Followed by nephrology

## 2023-11-19 NOTE — Progress Notes (Signed)
 Established Patient Office Visit  Subjective:  Patient ID: Danielle Howard, female    DOB: [birth year]  Age: 73 y.o. MRN: [id number]  CC:  Chief Complaint  Patient presents with   Medical Management of Chronic Issues    6 month f/u    Depression    Pt reports sx of stress would like something for her nerves.     HPI Danielle Howard is a 73 y.o. female with past medical history of HTN, PVD, hypothyroidism, gluacoma, uveitis and obesity who presents for f/u of her chronic medical conditions.  HTN: Her BP is well controlled today.  She takes lisinopril  10 mg daily and amlodipine 5 mg daily.  She denies any headache, dizziness, chest pain, dyspnea or palpitations.  CKD: Her GFR was stable at 52 in 04/25.  She used to take cyclosporine, which was prescribed by her ophthalmologist for uveitis, which was likely reason for kidney injury.  She denies any dysuria, hematuria or urinary hesitancy or resistance.  Hypothyroidism: She takes levothyroxine  125 mcg QD. Followed by Dr. Lenis.  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  She has tried taking Tylenol with mild relief.  She is able to make a fist.  Denies hand weakness.  Caregiver stress/GAD: She is taking care of her mother, who is dependent on her for ADLs as well.  She reports that her mother is very needy and wants her stuff done immediately, which increases stress for the patient.  She has tried to get an aide for her mother, but she does not qualify for it.  She reports worsening of anxiety and insomnia recently.  She has crying spells at times.  Denies SI or HI currently.  Past Medical History:  Diagnosis Date   Arthritis    Cataract    Complication of anesthesia    nausea   Glaucoma    Headache    Hypertension    Hypothyroidism    Hypothyroidism    Iritis    Thyroid  disease    Uveitis of left eye 12/29/2017    Past Surgical History:  Procedure Laterality Date   ABDOMINAL  HYSTERECTOMY     CATARACT EXTRACTION Left 2019   CATARACT EXTRACTION Right 06/12/2022   CHOLECYSTECTOMY     02/2012   COLONOSCOPY N/A 05/25/2013   Procedure: COLONOSCOPY;  Surgeon: Claudis RAYMOND Rivet, MD;  Location: [facility];  Service: Endoscopy;  Laterality: N/A;  830-moved to 1055 Ann to notify pt   COLONOSCOPY N/A 06/21/2018   Procedure: COLONOSCOPY;  Surgeon: Rivet Claudis RAYMOND, MD;  Location: [facility];  Service: Endoscopy;  Laterality: N/A;  1020   COLONOSCOPY WITH PROPOFOL  N/A 07/01/2023   Procedure: COLONOSCOPY WITH PROPOFOL ;  Surgeon: Cinderella Deatrice FALCON, MD;  Location: [facility];  Service: Endoscopy;  Laterality: N/A;  12:30PM;ASA 1-2   CYST EXCISION  08/21/2022   EYE SURGERY N/A    [employer] 07/13/2020   JOINT REPLACEMENT Bilateral    2012,2013-bilateral knees   POLYPECTOMY  06/21/2018   Procedure: POLYPECTOMY;  Surgeon: Rivet Claudis RAYMOND, MD;  Location: [facility];  Service: Endoscopy;;   POLYPECTOMY  07/01/2023   Procedure: POLYPECTOMY;  Surgeon: Cinderella Deatrice FALCON, MD;  Location: [facility];  Service: Endoscopy;;   THYROID  SURGERY      Family History  Problem Relation Age of Onset   Arthritis Mother    Heart disease Mother    Hypertension Mother  Arthritis Father    Hyperlipidemia Father    HIV/AIDS Brother     Social History   Socioeconomic History   Marital status: Single    Spouse name: Not on file   Number of children: Not on file   Years of education: Not on file   Highest education level: Not on file  Occupational History   Not on file  Tobacco Use   Smoking status: Never   Smokeless tobacco: Never  Vaping Use   Vaping status: Never Used  Substance and Sexual Activity   Alcohol use: Not Currently    Comment: occasional   Drug use: No   Sexual activity: Not Currently  Other Topics Concern   Not on file  Social History Narrative   Not on file   Social Drivers of Health   Financial Resource Strain: Medium Risk (10/27/2023)    Overall Financial Resource Strain (CARDIA)    Difficulty of Paying Living Expenses: Somewhat hard  Food Insecurity: No Food Insecurity (10/27/2023)   Hunger Vital Sign    Worried About Running Out of Food in the Last Year: Never true    Ran Out of Food in the Last Year: Never true  Transportation Needs: No Transportation Needs (10/19/2023)   PRAPARE - Administrator, Civil Service (Medical): No    Lack of Transportation (Non-Medical): No  Physical Activity: Sufficiently Active (10/19/2023)   Exercise Vital Sign    Days of Exercise per Week: 7 days    Minutes of Exercise per Session: 30 min  Stress: Stress Concern Present (10/27/2023)   [employer] of Occupational Health - Occupational Stress Questionnaire    Feeling of Stress: To some extent  Social Connections: Unknown (10/19/2023)   Social Connection and Isolation Panel    Frequency of Communication with Friends and Family: Twice a week    Frequency of Social Gatherings with Friends and Family: Never    Attends Religious Services: More than 4 times per year    Active Member of [employer] or Organizations: Patient declined    Attends Banker Meetings: Patient declined    Marital Status: Never married  Intimate Partner Violence: Not At Risk (10/27/2023)   Humiliation, Afraid, Rape, and Kick questionnaire    Fear of Current or Ex-Partner: No    Emotionally Abused: No    Physically Abused: No    Sexually Abused: No    Outpatient Medications Prior to Visit  Medication Sig Dispense Refill   amLODipine (NORVASC) 5 MG tablet Take 5 mg by mouth daily.     brimonidine (ALPHAGAN) 0.15 % ophthalmic solution Place 1 drop into both eyes 3 (three) times daily. 5 mL 12   Calcium Carbonate-Vit D-Min (CALCIUM 1200 PO) Take by mouth.     diclofenac  Sodium (VOLTAREN ) 1 % GEL Apply 4 g topically 4 (four) times daily. 100 g 1   dorzolamide (TRUSOPT) 2 % ophthalmic solution Place 1 drop into the left eye 2 (two) times daily.      folic acid  (FOLVITE ) 1 MG tablet Take 1 mg by mouth daily.      levothyroxine  (SYNTHROID ) 125 MCG tablet TAKE 1 TABLET(125 MCG) BY MOUTH DAILY BEFORE BREAKFAST 90 tablet 0   lisinopril  (ZESTRIL ) 10 MG tablet TAKE 1 TABLET(10 MG) BY MOUTH DAILY 90 tablet 2   Multiple Vitamins-Minerals (MULTIVITAMIN WITH MINERALS) tablet Take 1 tablet by mouth daily.     Na Sulfate-K Sulfate-Mg Sulfate concentrate 17.5-3.13-1.6 GM/177ML SOLN Use as directed 354 mL 0  potassium chloride  SA (KLOR-CON  M) 20 MEQ tablet TAKE 1 TABLET BY MOUTH EVERY DAY 90 tablet 0   timolol (TIMOPTIC) 0.5 % ophthalmic solution 1 drop 2 (two) times daily.     torsemide  (DEMADEX ) 20 MG tablet TAKE 1 TO 2 TABLETS BY MOUTH DAILY AS NEEDED FOR SWELLING 180 tablet 2   promethazine -dextromethorphan (PROMETHAZINE -DM) 6.25-15 MG/5ML syrup Take 5 mLs by mouth 4 (four) times daily as needed. 100 mL 0   No facility-administered medications prior to visit.    Allergies  Allergen Reactions   Acetazolamide    Dicloxacillin Other (See Comments)    GI upset    ROS Review of Systems  Constitutional:  Negative for chills and fever.  HENT:  Negative for congestion, sinus pressure, sinus pain and sore throat.   Eyes:  Negative for pain and discharge.  Respiratory:  Negative for cough and shortness of breath.   Cardiovascular:  Positive for leg swelling. Negative for chest pain and palpitations.  Gastrointestinal:  Negative for abdominal pain, constipation, diarrhea, nausea and vomiting.  Endocrine: Negative for polydipsia and polyuria.  Genitourinary:  Negative for dysuria and hematuria.  Musculoskeletal:  Positive for arthralgias (Bilateral hands) and neck pain. Negative for neck stiffness.  Skin:  Negative for rash.  Neurological:  Negative for dizziness and weakness.  Psychiatric/Behavioral:  Positive for sleep disturbance. Negative for agitation and behavioral problems. The patient is nervous/anxious.       Objective:    Physical  Exam Vitals reviewed.  Constitutional:      General: She is not in acute distress.    Appearance: She is not diaphoretic.  HENT:     Head: Normocephalic and atraumatic.     Nose: Nose normal.     Mouth/Throat:     Mouth: Mucous membranes are moist.  Eyes:     General: No scleral icterus.    Extraocular Movements: Extraocular movements intact.  Cardiovascular:     Rate and Rhythm: Normal rate and regular rhythm.     Heart sounds: Normal heart sounds. No murmur heard. Pulmonary:     Breath sounds: Normal breath sounds. No wheezing or rales.  Musculoskeletal:     Right hand: Normal range of motion.     Left hand: Normal range of motion.     Cervical back: Neck supple. No tenderness.     Right lower leg: No edema.     Left lower leg: No edema.     Comments: [place] and Bouchard nodes bilaterally  Skin:    General: Skin is warm.     Findings: No rash.  Neurological:     General: No focal deficit present.     Mental Status: She is alert and oriented to person, place, and time.     Sensory: No sensory deficit.     Motor: No weakness.  Psychiatric:        Mood and Affect: Mood is anxious. Affect is tearful.        Behavior: Behavior is cooperative.     BP 134/78   Pulse (!) 102   Ht 5' 6 (1.676 m)   Wt 191 lb 6.4 oz (86.8 kg)   SpO2 94%   BMI 30.89 kg/m  Wt Readings from Last 3 Encounters:  11/19/23 191 lb 6.4 oz (86.8 kg)  10/19/23 200 lb (90.7 kg)  09/08/23 199 lb 12.8 oz (90.6 kg)    Lab Results  Component Value Date   TSH 0.972 08/04/2023   Lab Results  Component Value  Date   WBC 4.3 12/10/2020   HGB 12.5 08/10/2023   HCT 40 08/10/2023   MCV 99 (H) 12/10/2020   PLT 310 12/10/2020   Lab Results  Component Value Date   NA 145 08/10/2023   K 3.9 06/26/2023   CO2 27 06/26/2023   GLUCOSE 96 06/26/2023   BUN 16 06/26/2023   CREATININE 1.1 08/10/2023   BILITOT 0.9 12/10/2020   ALKPHOS 118 12/10/2020   AST 27 12/10/2020   ALT 17 12/10/2020   PROT 7.2  12/10/2020   ALBUMIN 4.5 12/10/2020   CALCIUM 9.3 06/26/2023   ANIONGAP 12 06/26/2023   EGFR 52 08/10/2023   Lab Results  Component Value Date   CHOL 184 08/04/2023   Lab Results  Component Value Date   HDL 67 08/04/2023   Lab Results  Component Value Date   LDLCALC 98 08/04/2023   Lab Results  Component Value Date   TRIG 109 08/04/2023   Lab Results  Component Value Date   CHOLHDL 2.7 08/04/2023   Lab Results  Component Value Date   HGBA1C 5.8 09/08/2023      Assessment & Plan:   Problem List Items Addressed This Visit       Cardiovascular and Mediastinum   Essential hypertension - Primary   BP Readings from Last 1 Encounters:  11/19/23 134/78   Well-controlled with lisinopril  10 mg QD and amlodipine 5 mg QD now Followed by Nephrology Counseled for compliance with the medications Advised DASH diet and moderate exercise/walking as tolerated        Endocrine   Hypothyroidism   On Levothyroxine  125 mcg QD Follows up with Endocrinology - Dr Lenis        Genitourinary   Chronic kidney disease (CKD), stage III (moderate) (HCC)   Last BMP reviewed - GFR stable at 52 She has stopped taking soft drinks cyclosporine and acyclovir discontinued Advised to maintain proper hydration Avoid nephrotoxic agents Followed by nephrology        Other   GAD (generalized anxiety disorder) (Chronic)   Likely worse in the setting of caregiver stress Started mirtazapine  7.5 mg nightly for GAD and insomnia Hydroxyzine  10 mg BID as needed for anxiety      Relevant Medications   mirtazapine  (REMERON ) 7.5 MG tablet   hydrOXYzine  (ATARAX ) 10 MG tablet   Prediabetes   Lab Results  Component Value Date   HGBA1C 5.8 09/08/2023   Advised to follow low-carb diet for now      Caregiver stress   She is sole caregiver for her mother, which has increased her anxiety She has crying spells at times due to excessive stress Hydroxyzine  10 mg BID as needed for anxiety       Insomnia   Added Mirtazapine  for insomnia Hydroxyzine  10 mg BID as needed for anxiety       Relevant Medications   mirtazapine  (REMERON ) 7.5 MG tablet       Meds ordered this encounter  Medications   mirtazapine  (REMERON ) 7.5 MG tablet    Sig: Take 1 tablet (7.5 mg total) by mouth at bedtime.    Dispense:  30 tablet    Refill:  3   hydrOXYzine  (ATARAX ) 10 MG tablet    Sig: Take 1 tablet (10 mg total) by mouth 2 (two) times daily as needed.    Dispense:  60 tablet    Refill:  0     Follow-up: Return in about 4 months (around 03/21/2024) for GAD and HTN.  Suzzane MARLA Blanch, MD

## 2023-11-20 ENCOUNTER — Other Ambulatory Visit (HOSPITAL_COMMUNITY): Payer: Self-pay

## 2023-11-20 NOTE — Telephone Encounter (Signed)
 Pharmacy Patient Advocate Encounter  Received notification from [employer] that Prior Authorization for hydrOXYzine  HCl 10MG  tablets  has been DENIED.  Full denial letter will be uploaded to the media tab. See denial reason below.   PA #/Case ID/Reference #: [id number]

## 2023-11-23 ENCOUNTER — Ambulatory Visit: Payer: Self-pay | Admitting: Internal Medicine

## 2023-11-23 ENCOUNTER — Ambulatory Visit (HOSPITAL_COMMUNITY)
Admission: RE | Admit: 2023-11-23 | Discharge: 2023-11-23 | Disposition: A | Discharge status: Home / Self Care | Source: Ambulatory Visit | Attending: Internal Medicine | Admitting: Internal Medicine

## 2023-11-23 ENCOUNTER — Telehealth: Payer: Self-pay

## 2023-11-23 DIAGNOSIS — Z1231 Encounter for screening mammogram for malignant neoplasm of breast: Secondary | ICD-10-CM | POA: Insufficient documentation

## 2023-11-23 DIAGNOSIS — Z780 Asymptomatic menopausal state: Secondary | ICD-10-CM | POA: Insufficient documentation

## 2023-11-23 DIAGNOSIS — M810 Age-related osteoporosis without current pathological fracture: Secondary | ICD-10-CM | POA: Diagnosis not present

## 2023-11-23 NOTE — Telephone Encounter (Unsigned)
 Copied from CRM [id number]. Topic: Clinical - Lab/Test Results >> Nov 23, 2023  3:10 PM Delon HERO wrote: Reason for CRM: Patient is calling to report that she is taking OTC calcium with Vitamin D3 40mcg (1600 ) from [employer].

## 2023-11-23 NOTE — Telephone Encounter (Signed)
 Left detailed vm for patient.

## 2023-11-24 ENCOUNTER — Other Ambulatory Visit: Payer: Self-pay | Admitting: Internal Medicine

## 2023-11-25 ENCOUNTER — Telehealth: Payer: Self-pay

## 2023-11-25 NOTE — Telephone Encounter (Signed)
 Spoke to pt let her know results.

## 2023-11-25 NOTE — Telephone Encounter (Signed)
 Spoke to pt

## 2023-11-25 NOTE — Telephone Encounter (Signed)
 Copied from CRM [id number]. Topic: General - Other >> Nov 25, 2023 11:14 AM Everette C wrote: Reason for CRM: The patient has returned a missed call from DOROTHA Heck CMA and would like to speak with the member of staff further to continue ongoing discussions if/when possible

## 2023-11-28 ENCOUNTER — Other Ambulatory Visit: Payer: Self-pay | Admitting: Internal Medicine

## 2024-01-26 ENCOUNTER — Ambulatory Visit
Admission: EM | Admit: 2024-01-26 | Discharge: 2024-01-26 | Disposition: A | Discharge status: Home / Self Care | Attending: Nurse Practitioner | Admitting: Nurse Practitioner

## 2024-01-26 DIAGNOSIS — J069 Acute upper respiratory infection, unspecified: Secondary | ICD-10-CM | POA: Diagnosis not present

## 2024-01-26 LAB — POC COVID19/FLU A&B COMBO
Covid Antigen, POC: NEGATIVE
Influenza A Antigen, POC: NEGATIVE
Influenza B Antigen, POC: NEGATIVE

## 2024-01-26 MED ORDER — PROMETHAZINE-DM 6.25-15 MG/5ML PO SYRP: 5.0000 mL | ORAL_SOLUTION | Freq: Four times a day (QID) | ORAL | 0 refills | Status: AC | PRN

## 2024-01-26 MED ORDER — FLUTICASONE PROPIONATE 50 MCG/ACT NA SUSP
2.0000 | Freq: Every day | NASAL | 0 refills | Status: DC
Start: 2024-01-26 — End: 2024-03-24

## 2024-01-26 MED ORDER — CETIRIZINE HCL 10 MG PO TABS: 10.0000 mg | ORAL_TABLET | Freq: Every day | ORAL | 0 refills | Status: AC

## 2024-01-26 NOTE — Discharge Instructions (Addendum)
 The COVID test was negative.  You most likely have a viral upper respiratory infection. Take medication as prescribed. Increase fluids and allow for plenty of rest. You may take over-the-counter Tylenol as needed for pain, fever, or general discomfort. Recommend normal saline nasal spray throughout the day for nasal congestion and runny nose. You may also use warm salt water  gargles 3-4 times daily as needed for throat pain or discomfort.  Also recommend over-the-counter Chloraseptic throat spray or throat lozenges as needed. For your cough, you may find it helpful to use a humidifier in your bedroom at nighttime during sleep and to sleep elevated on pillows while symptoms persist. Symptoms should begin to improve over the next 5 to 7 days.  If symptoms fail to improve, or begin to worsen, you may follow-up in this clinic or with your primary care physician for further evaluation. Follow-up as needed.

## 2024-01-26 NOTE — ED Provider Notes (Signed)
 [facility]    CSN: [id number] Arrival date & time: 01/26/24  1212      History   Chief Complaint No chief complaint on file.   HPI Danielle Howard is a 73 y.o. female.   The history is provided by the patient.   Patient presents with a 2-day history of sore throat, nasal congestion, headache, and cough.  Patient denies fever, chills, ear pain, ear drainage, wheezing, difficulty breathing, chest pain, abdominal pain, nausea, vomiting, diarrhea, or rash.  So far, patient states she has been taking over-the-counter medications for her symptoms.  Past Medical History:  Diagnosis Date   Arthritis    Cataract    Complication of anesthesia    nausea   Glaucoma    Headache    Hypertension    Hypothyroidism    Hypothyroidism    Iritis    Thyroid  disease    Uveitis of left eye 12/29/2017    Patient Active Problem List   Diagnosis Date Noted   Caregiver stress 11/19/2023   GAD (generalized anxiety disorder) 11/19/2023   Insomnia 11/19/2023   DDD (degenerative disc disease), cervical 11/21/2022   Class 1 obesity due to excess calories with serious comorbidity and body mass index (BMI) of 32.0 to 32.9 in adult 10/09/2022   Neck pain 05/21/2022   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   Candidiasis, intertrigo 09/11/2021   Lymphedema 12/10/2020   Lipoma of right upper extremity 12/10/2020   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, severe stage 07/04/2020   Chronic kidney disease (CKD), stage III (moderate) (HCC) 04/04/2019   Gout 10/18/2018   Mixed hyperlipidemia 07/30/2018   Hx of colonic polyps 05/20/2018   Acute iritis, left eye 04/07/2018   Macular pucker, left eye 12/29/2017   Nuclear sclerotic cataract of right eye 12/29/2017   Uveitis 12/29/2017   Hypertensive retinopathy 12/29/2017   PVD (peripheral vascular disease) 11/24/2016   Osteoarthritis, hand 10/01/2016   Hypothyroidism 01/23/2016   Prediabetes 03/07/2013   Hashimoto's thyroiditis  01/03/2013   Obesity (BMI 30-39.9) 01/03/2013   Essential hypertension 01/03/2013   Unilateral primary osteoarthritis, right knee 08/11/2011    Past Surgical History:  Procedure Laterality Date   ABDOMINAL HYSTERECTOMY     CATARACT EXTRACTION Left 2019   CATARACT EXTRACTION Right 06/12/2022   CHOLECYSTECTOMY     02/2012   COLONOSCOPY N/A 05/25/2013   Procedure: COLONOSCOPY;  Surgeon: Claudis RAYMOND Rivet, MD;  [facility];  Service: Endoscopy;  Laterality: N/A;  830-moved to 1055 Ann to notify pt   COLONOSCOPY N/A 06/21/2018   Procedure: COLONOSCOPY;  Surgeon: Rivet Claudis RAYMOND, MD;  [facility]: [facility];  Service: Endoscopy;  Laterality: N/A;  1020   COLONOSCOPY WITH PROPOFOL  N/A 07/01/2023   Procedure: COLONOSCOPY WITH PROPOFOL ;  Surgeon: Cinderella Deatrice FALCON, MD;  [facility];  Service: Endoscopy;  Laterality: N/A;  12:30PM;ASA 1-2   CYST EXCISION  08/21/2022   EYE SURGERY N/A    Phreesia 07/13/2020   JOINT REPLACEMENT Bilateral    2012,2013-bilateral knees   POLYPECTOMY  06/21/2018   Procedure: POLYPECTOMY;  Surgeon: Rivet Claudis RAYMOND, MD;  [facility];  Service: Endoscopy;;   POLYPECTOMY  07/01/2023   Procedure: POLYPECTOMY;  Surgeon: Cinderella Deatrice FALCON, MD;  [facility];  Service: Endoscopy;;   THYROID  SURGERY      OB History   No obstetric history on file.      Home Medications    Prior to Admission medications  Medication Sig Start Date End Date Taking? Authorizing Provider  cetirizine  (ZYRTEC ) 10 MG tablet Take 1 tablet (10 mg total) by mouth daily. 01/26/24  Yes Leath-Warren, Etta PARAS, NP  fluticasone  (FLONASE ) 50 MCG/ACT nasal spray Place 2 sprays into both nostrils daily. 01/26/24  Yes Leath-Warren, Etta PARAS, NP  promethazine -dextromethorphan (PROMETHAZINE -DM) 6.25-15 MG/5ML syrup Take 5 mLs by mouth 4 (four) times daily as needed. 01/26/24  Yes Leath-Warren, Etta PARAS, NP  amLODipine (NORVASC) 5 MG tablet Take 5 mg by  mouth daily.    [provider]  brimonidine (ALPHAGAN) 0.15 % ophthalmic solution Place 1 drop into both eyes 3 (three) times daily. 02/10/20   Bari Theodoro FALCON, MD  Calcium Carbonate-Vit D-Min (CALCIUM 1200 PO) Take by mouth.    [provider]  diclofenac  Sodium (VOLTAREN ) 1 % GEL Apply 4 g topically 4 (four) times daily. 05/22/23   Tobie Suzzane POUR, MD  dorzolamide (TRUSOPT) 2 % ophthalmic solution Place 1 drop into the left eye 2 (two) times daily.    [provider]  folic acid  (FOLVITE ) 1 MG tablet Take 1 mg by mouth daily.  01/25/18   [provider]  hydrOXYzine  (ATARAX ) 10 MG tablet Take 1 tablet (10 mg total) by mouth 2 (two) times daily as needed. 11/19/23   Tobie Suzzane POUR, MD  levothyroxine  (SYNTHROID ) 125 MCG tablet TAKE 1 TABLET(125 MCG) BY MOUTH DAILY BEFORE BREAKFAST 10/20/23   Nida, Gebreselassie W, MD  lisinopril  (ZESTRIL ) 10 MG tablet TAKE 1 TABLET(10 MG) BY MOUTH DAILY 09/15/23   Tobie Suzzane POUR, MD  mirtazapine  (REMERON ) 7.5 MG tablet Take 1 tablet (7.5 mg total) by mouth at bedtime. 11/19/23   Tobie Suzzane POUR, MD  Multiple Vitamins-Minerals (MULTIVITAMIN WITH MINERALS) tablet Take 1 tablet by mouth daily.    [provider]  Na Sulfate-K Sulfate-Mg Sulfate concentrate 17.5-3.13-1.6 GM/177ML SOLN Use as directed 06/15/23   Ahmed, Muhammad F, MD  potassium chloride  SA (KLOR-CON  M) 20 MEQ tablet TAKE 1 TABLET BY MOUTH EVERY DAY 11/24/23   Patel, Rutwik K, MD  timolol (TIMOPTIC) 0.5 % ophthalmic solution 1 drop 2 (two) times daily.    [provider]  torsemide  (DEMADEX ) 20 MG tablet TAKE 1 TO 2 TABLETS BY MOUTH DAILY AS NEEDED FOR SWELLING 07/28/23   Tobie Suzzane POUR, MD    Family History Family History  Problem Relation Age of Onset   Arthritis Mother    Heart disease Mother    Hypertension Mother    Arthritis Father    Hyperlipidemia Father    HIV/AIDS Brother     Social History Social History   Tobacco Use   Smoking  status: Never   Smokeless tobacco: Never  Vaping Use   Vaping status: Never Used  Substance Use Topics   Alcohol use: Not Currently    Comment: occasional   Drug use: No     Allergies   Acetazolamide and Dicloxacillin   Review of Systems Review of Systems Per HPI  Physical Exam Triage Vital Signs ED Triage Vitals  Encounter Vitals Group     BP 01/26/24 1226 121/72     Girls Systolic BP Percentile --      Girls Diastolic BP Percentile --      Boys Systolic BP Percentile --      Boys Diastolic BP Percentile --      Pulse Rate 01/26/24 1226 91     Resp 01/26/24 1226 18     Temp 01/26/24 1226 98.1 F (  36.7 C)     Temp Source 01/26/24 1226 Oral     SpO2 01/26/24 1226 96 %     Weight --      Height --      Head Circumference --      Peak Flow --      Pain Score 01/26/24 1227 0     Pain Loc --      Pain Education --      Exclude from Growth Chart --    No data found.  Updated Vital Signs BP 121/72 (BP Location: Right Arm)   Pulse 91   Temp 98.1 F (36.7 C) (Oral)   Resp 18   SpO2 96%   Visual Acuity Right Eye Distance:   Left Eye Distance:   Bilateral Distance:    Right Eye Near:   Left Eye Near:    Bilateral Near:     Physical Exam Constitutional:      General: She is not in acute distress.    Appearance: Normal appearance. She is well-developed.  HENT:     Head: Normocephalic and atraumatic.     Right Ear: Tympanic membrane, ear canal and external ear normal.     Left Ear: Tympanic membrane, ear canal and external ear normal.     Nose: Congestion present.     Right Turbinates: Enlarged and swollen.     Left Turbinates: Enlarged and swollen.     Right Sinus: No maxillary sinus tenderness or frontal sinus tenderness.     Left Sinus: No maxillary sinus tenderness or frontal sinus tenderness.     Mouth/Throat:     Lips: Pink.     Mouth: Mucous membranes are moist.     Pharynx: Uvula midline. Posterior oropharyngeal erythema and postnasal drip  present. No pharyngeal swelling, oropharyngeal exudate or uvula swelling.     Comments: Cobblestoning present to posterior oropharynx  Eyes:     Conjunctiva/sclera: Conjunctivae normal.     Pupils: Pupils are equal, round, and reactive to light.  Neck:     Thyroid : No thyromegaly.     Trachea: No tracheal deviation.  Cardiovascular:     Rate and Rhythm: Normal rate and regular rhythm.     Heart sounds: Normal heart sounds.  Pulmonary:     Effort: Pulmonary effort is normal. No respiratory distress.     Breath sounds: Normal breath sounds. No stridor. No wheezing, rhonchi or rales.  Abdominal:     General: Bowel sounds are normal.     Palpations: Abdomen is soft.  Musculoskeletal:     Cervical back: Normal range of motion and neck supple.  Skin:    General: Skin is warm and dry.  Neurological:     Mental Status: She is alert and oriented to person, place, and time.  Psychiatric:        Behavior: Behavior normal.        Thought Content: Thought content normal.        Judgment: Judgment normal.      UC Treatments / Results  Labs (all labs ordered are listed, but only abnormal results are displayed) Labs Reviewed  POC COVID19/FLU A&B COMBO    EKG   Radiology No results found.  Procedures Procedures (including critical care time)  Medications Ordered in UC Medications - No data to display  Initial Impression / Assessment and Plan / UC Course  I have reviewed the triage vital signs and the nursing notes.  Pertinent labs & imaging results that were  available during my care of the patient were reviewed by me and considered in my medical decision making (see chart for details).  COVID test is negative.  On exam, the patient's lung sounds are clear throughout, room air sats at 96%.  The patient is well-appearing, she is in no acute distress, vital signs are stable.  Symptoms most likely a viral etiology.  Will provide symptomatic treatment with Promethazine  DM for the  cough, fluticasone  50 micro nasal spray, and cetirizine  10 mg.  Supportive care recommendations were provided and discussed with the patient to include fluids, rest, over-the-counter analgesics, normal saline nasal spray, and use of a humidifier during sleep.  Discussed indications with patient regarding follow-up.  Patient was in agreement with this plan of care and verbalizes understanding.  All questions were answered.  Patient stable for discharge.  Final Clinical Impressions(s) / UC Diagnoses   Final diagnoses:  Viral URI with cough     Discharge Instructions      The COVID test was negative.  You most likely have a viral upper respiratory infection. Take medication as prescribed. Increase fluids and allow for plenty of rest. You may take over-the-counter Tylenol as needed for pain, fever, or general discomfort. Recommend normal saline nasal spray throughout the day for nasal congestion and runny nose. You may also use warm salt water  gargles 3-4 times daily as needed for throat pain or discomfort.  Also recommend over-the-counter Chloraseptic throat spray or throat lozenges as needed. For your cough, you may find it helpful to use a humidifier in your bedroom at nighttime during sleep and to sleep elevated on pillows while symptoms persist. Symptoms should begin to improve over the next 5 to 7 days.  If symptoms fail to improve, or begin to worsen, you may follow-up in this clinic or with your primary care physician for further evaluation. Follow-up as needed.     ED Prescriptions     Medication Sig Dispense Auth. Provider   promethazine -dextromethorphan (PROMETHAZINE -DM) 6.25-15 MG/5ML syrup Take 5 mLs by mouth 4 (four) times daily as needed. 118 mL Leath-Warren, Etta PARAS, NP   fluticasone  (FLONASE ) 50 MCG/ACT nasal spray Place 2 sprays into both nostrils daily. 16 g Leath-Warren, Etta PARAS, NP   cetirizine  (ZYRTEC ) 10 MG tablet Take 1 tablet (10 mg total) by mouth daily. 30  tablet Leath-Warren, Etta PARAS, NP      PDMP not reviewed this encounter.   Gilmer Etta PARAS, NP 01/26/24 1243

## 2024-01-26 NOTE — ED Triage Notes (Signed)
 Pt reports she has a headache, cough, no appetite, sore throat, and runny nose x 2 days     Took tylenol

## 2024-02-01 ENCOUNTER — Other Ambulatory Visit: Payer: Self-pay | Admitting: *Deleted

## 2024-02-01 ENCOUNTER — Telehealth: Payer: Self-pay | Admitting: "Endocrinology

## 2024-02-01 DIAGNOSIS — E782 Mixed hyperlipidemia: Secondary | ICD-10-CM

## 2024-02-01 DIAGNOSIS — E890 Postprocedural hypothyroidism: Secondary | ICD-10-CM

## 2024-02-01 DIAGNOSIS — E6609 Other obesity due to excess calories: Secondary | ICD-10-CM

## 2024-02-01 DIAGNOSIS — R7303 Prediabetes: Secondary | ICD-10-CM

## 2024-02-01 MED ORDER — LEVOTHYROXINE SODIUM 125 MCG PO TABS: 125.0000 ug | ORAL_TABLET | Freq: Every day | ORAL | 0 refills | Status: DC

## 2024-02-01 NOTE — Telephone Encounter (Signed)
A refill has been sent to the patient's pharmacy. 

## 2024-02-01 NOTE — Telephone Encounter (Signed)
 Pt needs refill of levothyroxine  sent to [facility] on [address], pt is out

## 2024-02-04 DIAGNOSIS — Z23 Encounter for immunization: Secondary | ICD-10-CM | POA: Diagnosis not present

## 2024-02-08 DIAGNOSIS — H35033 Hypertensive retinopathy, bilateral: Secondary | ICD-10-CM | POA: Diagnosis not present

## 2024-02-08 DIAGNOSIS — I10 Essential (primary) hypertension: Secondary | ICD-10-CM | POA: Diagnosis not present

## 2024-02-08 DIAGNOSIS — D631 Anemia in chronic kidney disease: Secondary | ICD-10-CM | POA: Diagnosis not present

## 2024-02-08 DIAGNOSIS — R809 Proteinuria, unspecified: Secondary | ICD-10-CM | POA: Diagnosis not present

## 2024-02-08 DIAGNOSIS — H401112 Primary open-angle glaucoma, right eye, moderate stage: Secondary | ICD-10-CM | POA: Diagnosis not present

## 2024-02-08 DIAGNOSIS — E211 Secondary hyperparathyroidism, not elsewhere classified: Secondary | ICD-10-CM | POA: Diagnosis not present

## 2024-02-08 DIAGNOSIS — N189 Chronic kidney disease, unspecified: Secondary | ICD-10-CM | POA: Diagnosis not present

## 2024-02-08 DIAGNOSIS — E119 Type 2 diabetes mellitus without complications: Secondary | ICD-10-CM | POA: Diagnosis not present

## 2024-02-08 DIAGNOSIS — H4042X3 Glaucoma secondary to eye inflammation, left eye, severe stage: Secondary | ICD-10-CM | POA: Diagnosis not present

## 2024-02-15 DIAGNOSIS — I129 Hypertensive chronic kidney disease with stage 1 through stage 4 chronic kidney disease, or unspecified chronic kidney disease: Secondary | ICD-10-CM | POA: Diagnosis not present

## 2024-02-15 DIAGNOSIS — N1831 Chronic kidney disease, stage 3a: Secondary | ICD-10-CM | POA: Diagnosis not present

## 2024-02-15 DIAGNOSIS — R809 Proteinuria, unspecified: Secondary | ICD-10-CM | POA: Diagnosis not present

## 2024-02-15 DIAGNOSIS — I119 Hypertensive heart disease without heart failure: Secondary | ICD-10-CM | POA: Diagnosis not present

## 2024-02-21 LAB — LAB REPORT - SCANNED
Albumin, Urine POC: 5.5
Creatinine, POC: 97.6 mg/dL
Microalb Creat Ratio: 6
PTH, Intact: 74

## 2024-03-01 DIAGNOSIS — E890 Postprocedural hypothyroidism: Secondary | ICD-10-CM | POA: Diagnosis not present

## 2024-03-02 LAB — TSH: TSH: 0.237 u[IU]/mL — ABNORMAL LOW (ref 0.450–4.500)

## 2024-03-02 LAB — T4, FREE: Free T4: 1.55 ng/dL (ref 0.82–1.77)

## 2024-03-10 ENCOUNTER — Ambulatory Visit (INDEPENDENT_AMBULATORY_CARE_PROVIDER_SITE_OTHER): Admitting: "Endocrinology

## 2024-03-10 ENCOUNTER — Encounter: Payer: Self-pay | Admitting: "Endocrinology

## 2024-03-10 VITALS — BP 106/68 | HR 84 | Ht 66.0 in | Wt 183.2 lb

## 2024-03-10 DIAGNOSIS — E890 Postprocedural hypothyroidism: Secondary | ICD-10-CM

## 2024-03-10 DIAGNOSIS — E782 Mixed hyperlipidemia: Secondary | ICD-10-CM | POA: Diagnosis not present

## 2024-03-10 DIAGNOSIS — R7303 Prediabetes: Secondary | ICD-10-CM

## 2024-03-10 LAB — POCT GLYCOSYLATED HEMOGLOBIN (HGB A1C): HbA1c, POC (controlled diabetic range): 6 % (ref 0.0–7.0)

## 2024-03-10 MED ORDER — LEVOTHYROXINE SODIUM 112 MCG PO TABS: 112.0000 ug | ORAL_TABLET | Freq: Every day | ORAL | 1 refills | Status: AC

## 2024-03-10 NOTE — Progress Notes (Signed)
 03/10/2024                       Endocrinology Follow Up Note    Subjective:    Patient ID: Danielle Howard, female    DOB: 1951/02/22, PCP Tobie Suzzane POUR, MD   Past Medical History:  Diagnosis Date   Arthritis    Cataract    Complication of anesthesia    nausea   Glaucoma    Headache    Hypertension    Hypothyroidism    Hypothyroidism    Iritis    Thyroid  disease    Uveitis of left eye 12/29/2017   Past Surgical History:  Procedure Laterality Date   ABDOMINAL HYSTERECTOMY     CATARACT EXTRACTION Left 2019   CATARACT EXTRACTION Right 06/12/2022   CHOLECYSTECTOMY     02/2012   COLONOSCOPY N/A 05/25/2013   Procedure: COLONOSCOPY;  Surgeon: Claudis RAYMOND Rivet, MD;  [facility];  Service: Endoscopy;  Laterality: N/A;  830-moved to 1055 Ann to notify pt   COLONOSCOPY N/A 06/21/2018   Procedure: COLONOSCOPY;  Surgeon: Rivet Claudis RAYMOND, MD;  [facility];  Service: Endoscopy;  Laterality: N/A;  1020   COLONOSCOPY WITH PROPOFOL  N/A 07/01/2023   Procedure: COLONOSCOPY WITH PROPOFOL ;  Surgeon: Cinderella Deatrice FALCON, MD;  [facility];  Service: Endoscopy;  Laterality: N/A;  12:30PM;ASA 1-2   CYST EXCISION  08/21/2022   EYE SURGERY N/A    Phreesia 07/13/2020   JOINT REPLACEMENT Bilateral    2012,2013-bilateral knees   POLYPECTOMY  06/21/2018   Procedure: POLYPECTOMY;  Surgeon: Rivet Claudis RAYMOND, MD;  [facility];  Service: Endoscopy;;   POLYPECTOMY  07/01/2023   Procedure: POLYPECTOMY;  Surgeon: Cinderella Deatrice FALCON, MD;  [facility];  Service: Endoscopy;;   THYROID  SURGERY     Social History   Socioeconomic History   Marital status: Single    Spouse name: Not on file   Number of children: Not on file   Years of education: Not on file   Highest education level: Not on file  Occupational History   Not on file  Tobacco Use   Smoking status: Never   Smokeless tobacco: Never  Vaping Use   Vaping status: Never Used   Substance and Sexual Activity   Alcohol use: Not Currently    Comment: occasional   Drug use: No   Sexual activity: Not Currently  Other Topics Concern   Not on file  Social History Narrative   Not on file   Social Drivers of Health   Financial Resource Strain: Medium Risk (10/27/2023)   Overall Financial Resource Strain (CARDIA)    Difficulty of Paying Living Expenses: Somewhat hard  Food Insecurity: No Food Insecurity (10/27/2023)   Hunger Vital Sign    Worried About Running Out of Food in the Last Year: Never true    Ran Out of Food in the Last Year: Never true  Transportation Needs: No Transportation Needs (10/19/2023)   PRAPARE - Administrator, Civil Service (Medical): No    Lack of Transportation (Non-Medical): No  Physical Activity: Sufficiently Active (10/19/2023)   Exercise Vital Sign    Days of Exercise per Week: 7 days    Minutes of Exercise per Session: 30 min  Stress: Stress Concern Present (10/27/2023)   [employer] of Occupational Health - Occupational Stress Questionnaire    Feeling of Stress: To some extent  Social  Connections: Unknown (10/19/2023)   Social Connection and Isolation Panel    Frequency of Communication with Friends and Family: Twice a week    Frequency of Social Gatherings with Friends and Family: Never    Attends Religious Services: More than 4 times per year    Active Member of [employer] or Organizations: Patient declined    Attends Banker Meetings: Patient declined    Marital Status: Never married   Outpatient Encounter Medications as of 03/10/2024  Medication Sig   amLODipine (NORVASC) 5 MG tablet Take 5 mg by mouth daily.   brimonidine (ALPHAGAN) 0.15 % ophthalmic solution Place 1 drop into both eyes 3 (three) times daily.   Calcium Carbonate-Vit D-Min (CALCIUM 1200 PO) Take by mouth.   cetirizine  (ZYRTEC ) 10 MG tablet Take 1 tablet (10 mg total) by mouth daily.   diclofenac  Sodium (VOLTAREN ) 1 % GEL Apply 4 g  topically 4 (four) times daily.   dorzolamide (TRUSOPT) 2 % ophthalmic solution Place 1 drop into the left eye 2 (two) times daily.   fluticasone  (FLONASE ) 50 MCG/ACT nasal spray Place 2 sprays into both nostrils daily.   folic acid  (FOLVITE ) 1 MG tablet Take 1 mg by mouth daily.    hydrOXYzine  (ATARAX ) 10 MG tablet Take 1 tablet (10 mg total) by mouth 2 (two) times daily as needed.   levothyroxine  (SYNTHROID ) 112 MCG tablet Take 1 tablet (112 mcg total) by mouth daily before breakfast.   lisinopril  (ZESTRIL ) 10 MG tablet TAKE 1 TABLET(10 MG) BY MOUTH DAILY   mirtazapine  (REMERON ) 7.5 MG tablet Take 1 tablet (7.5 mg total) by mouth at bedtime.   Multiple Vitamins-Minerals (MULTIVITAMIN WITH MINERALS) tablet Take 1 tablet by mouth daily.   Na Sulfate-K Sulfate-Mg Sulfate concentrate 17.5-3.13-1.6 GM/177ML SOLN Use as directed   potassium chloride  SA (KLOR-CON  M) 20 MEQ tablet TAKE 1 TABLET BY MOUTH EVERY DAY   promethazine -dextromethorphan (PROMETHAZINE -DM) 6.25-15 MG/5ML syrup Take 5 mLs by mouth 4 (four) times daily as needed.   timolol (TIMOPTIC) 0.5 % ophthalmic solution 1 drop 2 (two) times daily.   torsemide  (DEMADEX ) 20 MG tablet TAKE 1 TO 2 TABLETS BY MOUTH DAILY AS NEEDED FOR SWELLING   [DISCONTINUED] levothyroxine  (SYNTHROID ) 125 MCG tablet Take 1 tablet (125 mcg total) by mouth daily before breakfast.   No facility-administered encounter medications on file as of 03/10/2024.   ALLERGIES: Allergies  Allergen Reactions   Acetazolamide    Dicloxacillin Other (See Comments)    GI upset   VACCINATION STATUS: Immunization History  Administered Date(s) Administered   Fluad Quad(high Dose 65+) 01/13/2019, 01/11/2021, 01/15/2022   INFLUENZA, HIGH DOSE SEASONAL PF 01/20/2017, 01/27/2018, 01/04/2020   Influenza Whole 02/09/2013   Influenza,inj,Quad PF,6+ Mos 01/15/2015, 01/25/2016   Influenza-Unspecified 01/13/2014, 01/09/2023   Moderna SARS-COV2 Booster Vaccination 03/21/2020,  12/11/2020   Moderna Sars-Covid-2 Vaccination 06/16/2019, 07/18/2019   PNEUMOCOCCAL CONJUGATE-20 10/26/2021   Pneumococcal Conjugate-13 01/15/2015   Pneumococcal Polysaccharide-23 01/13/2014   Tdap 10/02/2009, 12/03/2022   Unspecified SARS-COV-2 Vaccination 01/09/2023   Varicella 03/03/2010   Zoster Recombinant(Shingrix) 02/09/2021, 04/10/2021    Thyroid  Problem Presents for follow-up visit. Patient reports no anxiety, cold intolerance, constipation, depressed mood, diarrhea, fatigue, hair loss, heat intolerance, palpitations, tremors, weight gain or weight loss. The symptoms have been stable.    She is on levothyroxine  125 mcg p.o. daily before breakfast.  She presents with unintended weight loss of 17 pounds.  Her previsit thyroid  function tests are consistent with slight over replacement.  She reports consistency and compliance with her medication.  She is grieving the death of her mother.  he denies palpitations, tremors, nor heat intolerance. she denies dysphagia, SOB, nor voice change.  She presents with steady weight.    Review of systems  Constitutional: + Minimally fluctuating body weight,  current Body mass index is 29.57 kg/m. , no fatigue,  no subjective hypothermia    Objective:    BP 106/68   Pulse 84   Ht 5' 6 (1.676 m)   Wt 183 lb 3.2 oz (83.1 kg)   BMI 29.57 kg/m   Wt Readings from Last 3 Encounters:  03/10/24 183 lb 3.2 oz (83.1 kg)  11/19/23 191 lb 6.4 oz (86.8 kg)  10/19/23 200 lb (90.7 kg)    BP Readings from Last 3 Encounters:  03/10/24 106/68  01/26/24 121/72  11/19/23 134/78      Physical Exam- Limited  Constitutional:  Body mass index is 29.57 kg/m. , not in acute distress, normal state of mind     CMP     Component Value Date/Time   NA 145 08/10/2023 0000   K 3.9 06/26/2023 0906   CL 105 06/26/2023 0906   CO2 27 06/26/2023 0906   GLUCOSE 96 06/26/2023 0906   BUN 16 06/26/2023 0906   BUN 32 (H) 12/10/2020 1140   CREATININE  1.1 08/10/2023 0000   CREATININE 1.12 (H) 06/26/2023 0906   CREATININE 1.28 (H) 02/10/2020 0926   CALCIUM 9.3 06/26/2023 0906   PROT 7.2 12/10/2020 1140   ALBUMIN 4.5 12/10/2020 1140   AST 27 12/10/2020 1140   ALT 17 12/10/2020 1140   ALKPHOS 118 12/10/2020 1140   BILITOT 0.9 12/10/2020 1140   GFRNONAA 52 (L) 06/26/2023 0906   GFRNONAA 41 (L) 03/28/2019 0744   GFRAA 48 (L) 03/28/2019 0744     Diabetic Labs (most recent): Lab Results  Component Value Date   HGBA1C 6.0 03/10/2024   HGBA1C 5.8 09/08/2023   HGBA1C 5.5 10/09/2022   MICROALBUR 3.6 02/10/2020   MICROALBUR 1.1 11/17/2018   MICROALBUR 0.4 08/09/2018     Lipid Panel ( most recent) Lipid Panel     Component Value Date/Time   CHOL 184 08/04/2023 0843   TRIG 109 08/04/2023 0843   HDL 67 08/04/2023 0843   CHOLHDL 2.7 08/04/2023 0843   CHOLHDL 2.6 02/10/2020 0926   VLDL 17 07/15/2016 0859   LDLCALC 98 08/04/2023 0843   LDLCALC 88 02/10/2020 0926    Recent Results (from the past 2160 hours)  POC Covid19/Flu A&B Antigen     Status: None   Collection Time: 01/26/24 12:29 PM  Result Value Ref Range   Influenza A Antigen, POC Negative Negative   Influenza B Antigen, POC Negative Negative   Covid Antigen, POC Negative Negative  TSH     Status: Abnormal   Collection Time: 03/01/24  9:13 AM  Result Value Ref Range   TSH 0.237 (L) 0.450 - 4.500 uIU/mL  T4, free     Status: None   Collection Time: 03/01/24  9:13 AM  Result Value Ref Range   Free T4 1.55 0.82 - 1.77 ng/dL  HgB J8r     Status: None   Collection Time: 03/10/24 11:03 AM  Result Value Ref Range   Hemoglobin A1C     HbA1c POC (<> result, manual entry)     HbA1c, POC (prediabetic range)     HbA1c, POC (controlled diabetic range) 6.0 0.0 - 7.0 %  Assessment & Plan:   1. Hypothyroidism r/t Hashimoto's thyroiditis with left Hemithyroidectomy: -Her previsit thyroid  function tests are consistent with over replacement.  I discussed and lowered  her levothyroxine  to 112 mcg p.o. daily before breakfast.     - We discussed about the correct intake of her thyroid  hormone, on empty stomach at fasting, with water , separated by at least 30 minutes from breakfast and other medications,  and separated by more than 4 hours from calcium, iron, multivitamins, acid reflux medications (PPIs). -Patient is made aware of the fact that thyroid  hormone replacement is needed for life, dose to be adjusted by periodic monitoring of thyroid  function tests.  2.  Prediabetes  -Her prior visit A1c was 6%.  She recently had unintended weight loss of 17 pounds, while grieving the death of her mother.  She is not on intervention at this time. She has LDL 98.  She is not on statins yet.  She hesitates to go on statins.     - I advised patient to maintain close follow up with Tobie Suzzane POUR, MD for primary care needs.   I spent  20  minutes in the care of the patient today including review of labs from Thyroid  Function, CMP, and other relevant labs ; imaging/biopsy records (current and previous including abstractions from other facilities); face-to-face time discussing  her lab results and symptoms, medications doses, her options of short and long term treatment based on the latest standards of care / guidelines;   and documenting the encounter.  Danielle Howard  participated in the discussions, expressed understanding, and voiced agreement with the above plans.  All questions were answered to her satisfaction. she is encouraged to contact clinic should she have any questions or concerns prior to her return visit.   Follow up plan: Return in about 6 months (around 09/07/2024) for F/U with Pre-visit Labs.  Benton Rio, [facility] [address] [place], [place] [address] Phone: [phone] Fax: [phone]   03/10/2024, 11:37 AM

## 2024-03-24 ENCOUNTER — Ambulatory Visit: Admitting: Internal Medicine

## 2024-03-24 ENCOUNTER — Encounter: Payer: Self-pay | Admitting: Internal Medicine

## 2024-03-24 VITALS — BP 117/73 | HR 89 | Ht 65.0 in | Wt 184.2 lb

## 2024-03-24 DIAGNOSIS — N1831 Chronic kidney disease, stage 3a: Secondary | ICD-10-CM | POA: Diagnosis not present

## 2024-03-24 DIAGNOSIS — I890 Lymphedema, not elsewhere classified: Secondary | ICD-10-CM

## 2024-03-24 DIAGNOSIS — F411 Generalized anxiety disorder: Secondary | ICD-10-CM | POA: Diagnosis not present

## 2024-03-24 DIAGNOSIS — E890 Postprocedural hypothyroidism: Secondary | ICD-10-CM | POA: Diagnosis not present

## 2024-03-24 DIAGNOSIS — I10 Essential (primary) hypertension: Secondary | ICD-10-CM

## 2024-03-24 NOTE — Assessment & Plan Note (Addendum)
 On Levothyroxine  112 mcg QD Follows up with Endocrinology - Dr Nida

## 2024-03-24 NOTE — Assessment & Plan Note (Addendum)
 Last BMP reviewed - GFR stable at 52 Check CMP She has stopped taking soft drinks cyclosporine and acyclovir discontinued Advised to maintain proper hydration Avoid nephrotoxic agents Followed by nephrology - Dr. Rachele

## 2024-03-24 NOTE — Assessment & Plan Note (Addendum)
 Was likely worse in the setting of caregiver stress Has mirtazapine  7.5 mg nightly for GAD and insomnia DC Hydroxyzine  as she has not needed it

## 2024-03-24 NOTE — Assessment & Plan Note (Signed)
Compression stocking Lymphedema pump PRN Demadex PRN, although advised to use cautiously to avoid risk of dehydration Leg elevation 

## 2024-03-24 NOTE — Patient Instructions (Signed)
Please continue to take medications as prescribed.  Please continue to follow low salt diet and perform moderate exercise/walking at least 150 mins/week. 

## 2024-03-24 NOTE — Assessment & Plan Note (Signed)
 BP Readings from Last 1 Encounters:  03/24/24 117/73   Well-controlled with lisinopril  10 mg QD and amlodipine 5 mg QD now Followed by Nephrology Counseled for compliance with the medications Advised DASH diet and moderate exercise/walking as tolerated

## 2024-03-24 NOTE — Progress Notes (Signed)
 Established Patient Office Visit  Subjective:  Patient ID: Danielle Howard, female    DOB: [birth year]  Age: 73 y.o. MRN: [id number]  CC:  Chief Complaint  Patient presents with   Anxiety    4 month f/u    Hypertension    4 month f/u     HPI Danielle Howard is a 73 y.o. female with past medical history of HTN, PVD, hypothyroidism, gluacoma, uveitis and obesity who presents for f/u of her chronic medical conditions.  HTN: Her BP is well controlled today.  She takes lisinopril  10 mg QD and amlodipine 5 mg QD.  She denies any headache, dizziness, chest pain, dyspnea or palpitations.  CKD: Her GFR was stable at 57 in 10/25.  She used to take cyclosporine, which was prescribed by her ophthalmologist for uveitis, which was likely reason for kidney injury.  She denies any dysuria, hematuria or urinary hesitancy or resistance.  Hypothyroidism: She takes levothyroxine  112 mcg QD. Followed by Dr. Lenis.  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  She has tried taking Tylenol with mild relief.  She is able to make a fist.  Denies hand weakness.  GAD: She was taking care of her mother, who was dependent on her for ADLs as well.  She was getting overwhelmed with her mother's care, who passed away in Feb 07, 2024.  She still takes mirtazapine  on some days for insomnia.  Denies SI or HI currently.  Past Medical History:  Diagnosis Date   Arthritis    Cataract    Complication of anesthesia    nausea   Glaucoma    Headache    Hypertension    Hypothyroidism    Hypothyroidism    Iritis    Thyroid  disease    Uveitis of left eye 12/29/2017    Past Surgical History:  Procedure Laterality Date   ABDOMINAL HYSTERECTOMY     CATARACT EXTRACTION Left 2019   CATARACT EXTRACTION Right 06/12/2022   CHOLECYSTECTOMY     02/2012   COLONOSCOPY N/A 05/25/2013   Procedure: COLONOSCOPY;  Surgeon: Claudis RAYMOND Rivet, MD;  Location: [facility];  Service: Endoscopy;   Laterality: N/A;  830-moved to 1055 Ann to notify pt   COLONOSCOPY N/A 06/21/2018   Procedure: COLONOSCOPY;  Surgeon: Rivet Claudis RAYMOND, MD;  Location: [facility];  Service: Endoscopy;  Laterality: N/A;  1020   COLONOSCOPY WITH PROPOFOL  N/A 07/01/2023   Procedure: COLONOSCOPY WITH PROPOFOL ;  Surgeon: Cinderella Deatrice FALCON, MD;  Location: [facility];  Service: Endoscopy;  Laterality: N/A;  12:30PM;ASA 1-2   CYST EXCISION  08/21/2022   EYE SURGERY N/A    [employer] 07/13/2020   JOINT REPLACEMENT Bilateral    2012,2013-bilateral knees   POLYPECTOMY  06/21/2018   Procedure: POLYPECTOMY;  Surgeon: Rivet Claudis RAYMOND, MD;  Location: [facility];  Service: Endoscopy;;   POLYPECTOMY  07/01/2023   Procedure: POLYPECTOMY;  Surgeon: Cinderella Deatrice FALCON, MD;  Location: [facility];  Service: Endoscopy;;   THYROID  SURGERY      Family History  Problem Relation Age of Onset   Arthritis Mother    Heart disease Mother    Hypertension Mother    Arthritis Father    Hyperlipidemia Father    HIV/AIDS Brother     Social History   Socioeconomic History   Marital status: Single    Spouse name: Not on file   Number of children: Not on file  Years of education: Not on file   Highest education level: 12th grade  Occupational History   Not on file  Tobacco Use   Smoking status: Never   Smokeless tobacco: Never  Vaping Use   Vaping status: Never Used  Substance and Sexual Activity   Alcohol use: Not Currently    Comment: occasional   Drug use: No   Sexual activity: Not Currently  Other Topics Concern   Not on file  Social History Narrative   Not on file   Social Drivers of Health   Financial Resource Strain: Low Risk  (03/20/2024)   Overall Financial Resource Strain (CARDIA)    Difficulty of Paying Living Expenses: Not hard at all  Food Insecurity: Patient Declined (03/20/2024)   Hunger Vital Sign    Worried About Running Out of Food in the Last Year: Patient declined    Ran Out of Food  in the Last Year: Patient declined  Transportation Needs: No Transportation Needs (03/20/2024)   PRAPARE - Administrator, Civil Service (Medical): No    Lack of Transportation (Non-Medical): No  Physical Activity: Insufficiently Active (03/20/2024)   Exercise Vital Sign    Days of Exercise per Week: 3 days    Minutes of Exercise per Session: 20 min  Stress: No Stress Concern Present (03/20/2024)   [employer] of Occupational Health - Occupational Stress Questionnaire    Feeling of Stress: Only a little  Social Connections: Moderately Integrated (03/20/2024)   Social Connection and Isolation Panel    Frequency of Communication with Friends and Family: More than three times a week    Frequency of Social Gatherings with Friends and Family: More than three times a week    Attends Religious Services: More than 4 times per year    Active Member of [employer] or Organizations: Yes    Attends Banker Meetings: Never    Marital Status: Never married  Intimate Partner Violence: Not At Risk (10/27/2023)   Humiliation, Afraid, Rape, and Kick questionnaire    Fear of Current or Ex-Partner: No    Emotionally Abused: No    Physically Abused: No    Sexually Abused: No    Outpatient Medications Prior to Visit  Medication Sig Dispense Refill   amLODipine (NORVASC) 5 MG tablet Take 5 mg by mouth daily.     brimonidine (ALPHAGAN) 0.15 % ophthalmic solution Place 1 drop into both eyes 3 (three) times daily. 5 mL 12   Calcium Carbonate-Vit D-Min (CALCIUM 1200 PO) Take by mouth.     cetirizine  (ZYRTEC ) 10 MG tablet Take 1 tablet (10 mg total) by mouth daily. 30 tablet 0   dorzolamide (TRUSOPT) 2 % ophthalmic solution Place 1 drop into the left eye 2 (two) times daily.     levothyroxine  (SYNTHROID ) 112 MCG tablet Take 1 tablet (112 mcg total) by mouth daily before breakfast. 90 tablet 1   lisinopril  (ZESTRIL ) 10 MG tablet TAKE 1 TABLET(10 MG) BY MOUTH DAILY 90 tablet 2    mirtazapine  (REMERON ) 7.5 MG tablet Take 1 tablet (7.5 mg total) by mouth at bedtime. 30 tablet 3   Multiple Vitamins-Minerals (MULTIVITAMIN WITH MINERALS) tablet Take 1 tablet by mouth daily.     potassium chloride  SA (KLOR-CON  M) 20 MEQ tablet TAKE 1 TABLET BY MOUTH EVERY DAY 90 tablet 0   promethazine -dextromethorphan (PROMETHAZINE -DM) 6.25-15 MG/5ML syrup Take 5 mLs by mouth 4 (four) times daily as needed. 118 mL 0   timolol (TIMOPTIC) 0.5 % ophthalmic  solution 1 drop 2 (two) times daily.     torsemide  (DEMADEX ) 20 MG tablet TAKE 1 TO 2 TABLETS BY MOUTH DAILY AS NEEDED FOR SWELLING 180 tablet 2   hydrOXYzine  (ATARAX ) 10 MG tablet Take 1 tablet (10 mg total) by mouth 2 (two) times daily as needed. 60 tablet 0   diclofenac  Sodium (VOLTAREN ) 1 % GEL Apply 4 g topically 4 (four) times daily. 100 g 1   fluticasone  (FLONASE ) 50 MCG/ACT nasal spray Place 2 sprays into both nostrils daily. 16 g 0   folic acid  (FOLVITE ) 1 MG tablet Take 1 mg by mouth daily.      Na Sulfate-K Sulfate-Mg Sulfate concentrate 17.5-3.13-1.6 GM/177ML SOLN Use as directed (Patient not taking: Reported on 03/24/2024) 354 mL 0   No facility-administered medications prior to visit.    Allergies  Allergen Reactions   Acetazolamide    Dicloxacillin Other (See Comments)    GI upset    ROS Review of Systems  Constitutional:  Negative for chills and fever.  HENT:  Negative for congestion, sinus pressure, sinus pain and sore throat.   Eyes:  Negative for pain and discharge.  Respiratory:  Negative for cough and shortness of breath.   Cardiovascular:  Positive for leg swelling. Negative for chest pain and palpitations.  Gastrointestinal:  Negative for abdominal pain, constipation, diarrhea, nausea and vomiting.  Endocrine: Negative for polydipsia and polyuria.  Genitourinary:  Negative for dysuria and hematuria.  Musculoskeletal:  Positive for arthralgias (Bilateral hands) and neck pain. Negative for neck stiffness.   Skin:  Negative for rash.  Neurological:  Negative for dizziness and weakness.  Psychiatric/Behavioral:  Positive for sleep disturbance. Negative for agitation and behavioral problems.       Objective:    Physical Exam Vitals reviewed.  Constitutional:      General: She is not in acute distress.    Appearance: She is not diaphoretic.  HENT:     Head: Normocephalic and atraumatic.     Nose: Nose normal.     Mouth/Throat:     Mouth: Mucous membranes are moist.  Eyes:     General: No scleral icterus.    Extraocular Movements: Extraocular movements intact.  Cardiovascular:     Rate and Rhythm: Normal rate and regular rhythm.     Heart sounds: Normal heart sounds. No murmur heard. Pulmonary:     Breath sounds: Normal breath sounds. No wheezing or rales.  Musculoskeletal:     Right hand: Normal range of motion.     Left hand: Normal range of motion.     Cervical back: Neck supple. No tenderness.     Right lower leg: Edema (Mild) present.     Left lower leg: Edema (Mild) present.     Comments: [place] and Bouchard nodes bilaterally  Skin:    General: Skin is warm.     Findings: No rash.  Neurological:     General: No focal deficit present.     Mental Status: She is alert and oriented to person, place, and time.     Sensory: No sensory deficit.     Motor: No weakness.  Psychiatric:        Mood and Affect: Mood normal.        Behavior: Behavior is cooperative.     BP 117/73   Pulse 89   Ht 5' 5 (1.651 m)   Wt 184 lb 3.2 oz (83.6 kg)   SpO2 100%   BMI 30.65 kg/m  Wt Readings  from Last 3 Encounters:  03/24/24 184 lb 3.2 oz (83.6 kg)  03/10/24 183 lb 3.2 oz (83.1 kg)  11/19/23 191 lb 6.4 oz (86.8 kg)    Lab Results  Component Value Date   TSH 0.237 (L) 03/01/2024   Lab Results  Component Value Date   WBC 4.3 12/10/2020   HGB 12.5 08/10/2023   HCT 40 08/10/2023   MCV 99 (H) 12/10/2020   PLT 310 12/10/2020   Lab Results  Component Value Date   NA 145  08/10/2023   K 3.9 06/26/2023   CO2 27 06/26/2023   GLUCOSE 96 06/26/2023   BUN 16 06/26/2023   CREATININE 1.1 08/10/2023   BILITOT 0.9 12/10/2020   ALKPHOS 118 12/10/2020   AST 27 12/10/2020   ALT 17 12/10/2020   PROT 7.2 12/10/2020   ALBUMIN 4.5 12/10/2020   CALCIUM 9.3 06/26/2023   ANIONGAP 12 06/26/2023   EGFR 52 08/10/2023   Lab Results  Component Value Date   CHOL 184 08/04/2023   Lab Results  Component Value Date   HDL 67 08/04/2023   Lab Results  Component Value Date   LDLCALC 98 08/04/2023   Lab Results  Component Value Date   TRIG 109 08/04/2023   Lab Results  Component Value Date   CHOLHDL 2.7 08/04/2023   Lab Results  Component Value Date   HGBA1C 6.0 03/10/2024      Assessment & Plan:   Problem List Items Addressed This Visit       Cardiovascular and Mediastinum   Essential hypertension - Primary   BP Readings from Last 1 Encounters:  03/24/24 117/73   Well-controlled with lisinopril  10 mg QD and amlodipine 5 mg QD now Followed by Nephrology Counseled for compliance with the medications Advised DASH diet and moderate exercise/walking as tolerated        Endocrine   Hypothyroidism   On Levothyroxine  112 mcg QD Follows up with Endocrinology - Dr Lenis        Genitourinary   Chronic kidney disease (CKD), stage III (moderate) (HCC)   Last BMP reviewed - GFR stable at 52 Check CMP She has stopped taking soft drinks cyclosporine and acyclovir discontinued Advised to maintain proper hydration Avoid nephrotoxic agents Followed by nephrology - Dr. Rachele        Other   GAD (generalized anxiety disorder) (Chronic)   Was likely worse in the setting of caregiver stress Has mirtazapine  7.5 mg nightly for GAD and insomnia DC Hydroxyzine  as she has not needed it      Lymphedema   Compression stocking Lymphedema pump PRN Demadex  PRN, although advised to use cautiously to avoid risk of dehydration Leg elevation           No  orders of the defined types were placed in this encounter.    Follow-up: Return in about 6 months (around 09/21/2024).    Suzzane MARLA Blanch, MD

## 2024-05-04 ENCOUNTER — Other Ambulatory Visit: Payer: Self-pay | Admitting: Internal Medicine

## 2024-09-07 ENCOUNTER — Ambulatory Visit: Admitting: "Endocrinology

## 2024-09-22 ENCOUNTER — Ambulatory Visit: Admitting: Internal Medicine

## 2024-10-19 ENCOUNTER — Ambulatory Visit
